# Patient Record
Sex: Male | Born: 1945
Health system: Southern US, Community
[De-identification: ages and names within clinical notes are randomized; demographics above are authoritative.]

## PROBLEM LIST (undated history)

## (undated) DIAGNOSIS — I2510 Atherosclerotic heart disease of native coronary artery without angina pectoris: Secondary | ICD-10-CM

## (undated) DIAGNOSIS — E785 Hyperlipidemia, unspecified: Secondary | ICD-10-CM

## (undated) DIAGNOSIS — K649 Unspecified hemorrhoids: Secondary | ICD-10-CM

## (undated) DIAGNOSIS — K219 Gastro-esophageal reflux disease without esophagitis: Secondary | ICD-10-CM

## (undated) DIAGNOSIS — K635 Polyp of colon: Secondary | ICD-10-CM

## (undated) DIAGNOSIS — I519 Heart disease, unspecified: Secondary | ICD-10-CM

## (undated) DIAGNOSIS — I499 Cardiac arrhythmia, unspecified: Secondary | ICD-10-CM

## (undated) DIAGNOSIS — I219 Acute myocardial infarction, unspecified: Secondary | ICD-10-CM

## (undated) DIAGNOSIS — E119 Type 2 diabetes mellitus without complications: Secondary | ICD-10-CM

## (undated) DIAGNOSIS — R0600 Dyspnea, unspecified: Secondary | ICD-10-CM

## (undated) DIAGNOSIS — I10 Essential (primary) hypertension: Secondary | ICD-10-CM

## (undated) HISTORY — DX: Acute myocardial infarction, unspecified: I21.9

## (undated) HISTORY — PX: COLONOSCOPY: SHX174

## (undated) HISTORY — DX: Type 2 diabetes mellitus without complications: E11.9

## (undated) HISTORY — PX: EYE SURGERY: SHX253

## (undated) HISTORY — DX: Heart disease, unspecified: I51.9

## (undated) HISTORY — DX: Hyperlipidemia, unspecified: E78.5

## (undated) HISTORY — DX: Essential (primary) hypertension: I10

## (undated) HISTORY — PX: HERNIA REPAIR: SHX51

---

## 1997-03-01 DIAGNOSIS — I219 Acute myocardial infarction, unspecified: Secondary | ICD-10-CM

## 1997-03-01 HISTORY — DX: Acute myocardial infarction, unspecified: I21.9

## 1997-03-01 HISTORY — PX: CORONARY ANGIOPLASTY WITH STENT PLACEMENT: SHX49

## 2007-08-07 ENCOUNTER — Ambulatory Visit: Payer: Self-pay | Admitting: General Surgery

## 2008-03-01 HISTORY — PX: CHOLECYSTECTOMY: SHX55

## 2008-12-08 ENCOUNTER — Emergency Department: Payer: Self-pay | Admitting: Emergency Medicine

## 2008-12-10 ENCOUNTER — Inpatient Hospital Stay: Payer: Self-pay | Admitting: General Surgery

## 2009-11-12 ENCOUNTER — Ambulatory Visit: Payer: Self-pay | Admitting: Family Medicine

## 2011-02-19 IMAGING — CT CT ABD-PELV W/ CM
1 of 3 series · 12 of 32 positions shown, 18 images · non-contrast
Comparison: none

REASON FOR EXAM: (1) abdominal pain; (2) same
COMMENTS:

[Series 2: abd/pelvis · axial · 0.75mm/px · z∈[-576,-142]mm · 12 of 173 slices shown, 18 images]
[im 14/173  soft-tissue]
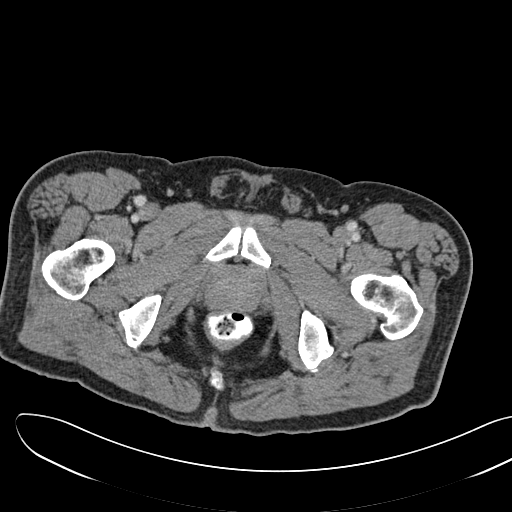
[im 14/173  bone]
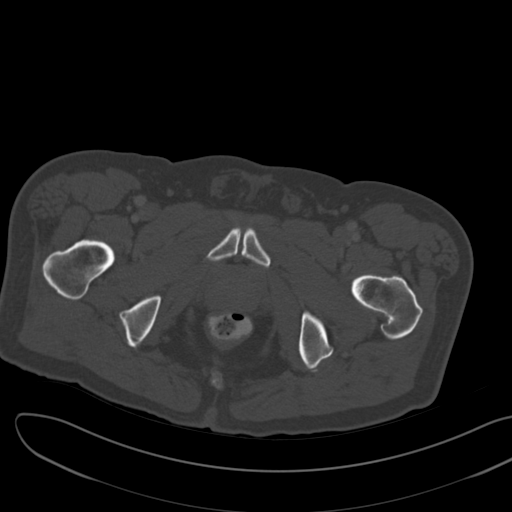
[im 27/173  soft-tissue]
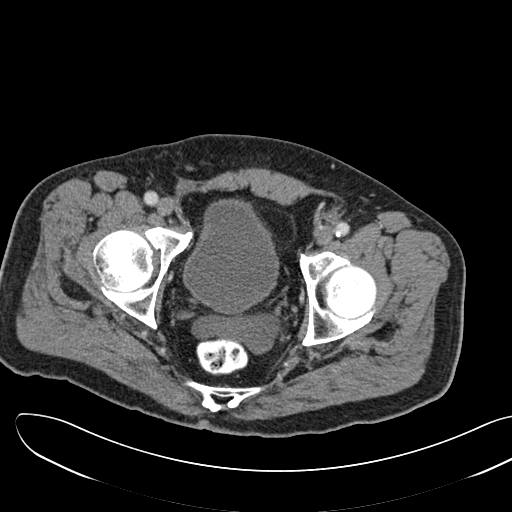
[im 40/173  soft-tissue]
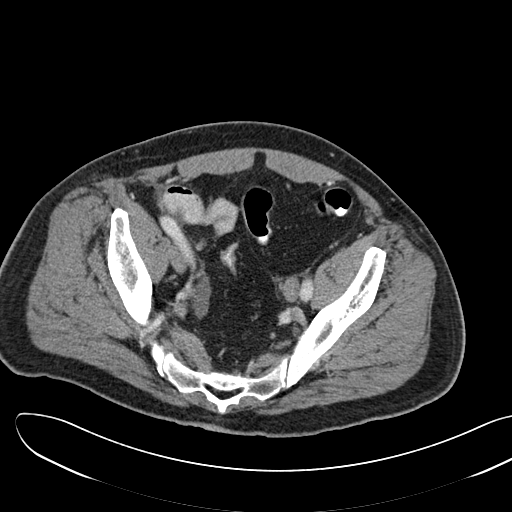
[im 53/173  soft-tissue]
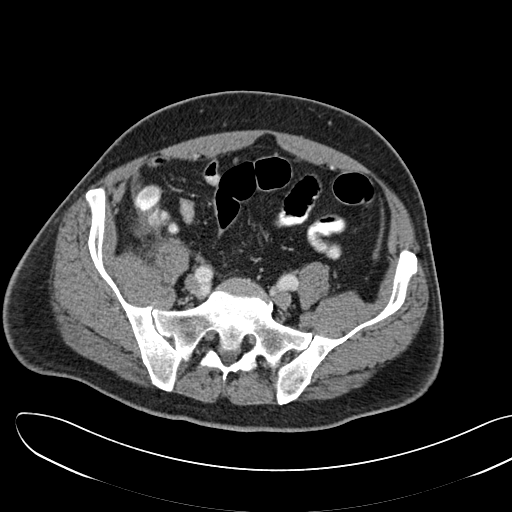
[im 67/173  soft-tissue]
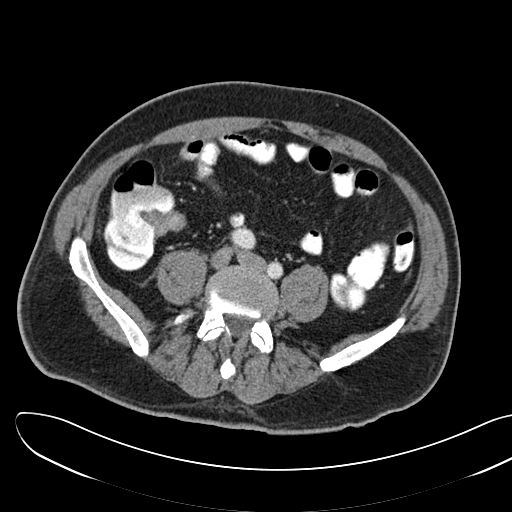
[im 80/173  soft-tissue]
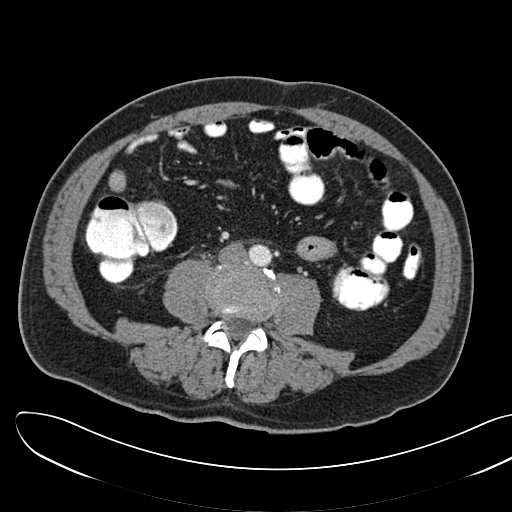
[im 93/173  soft-tissue]
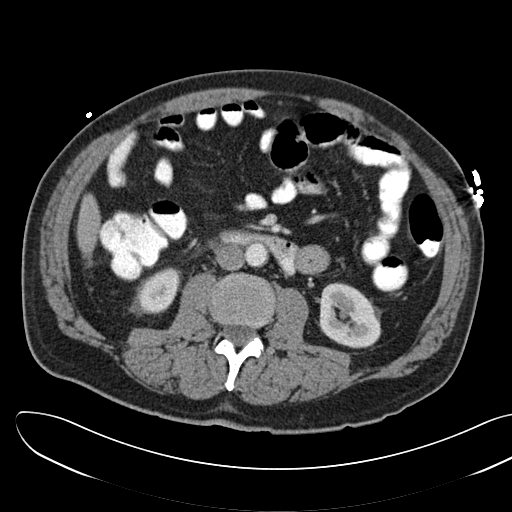
[im 106/173  soft-tissue]
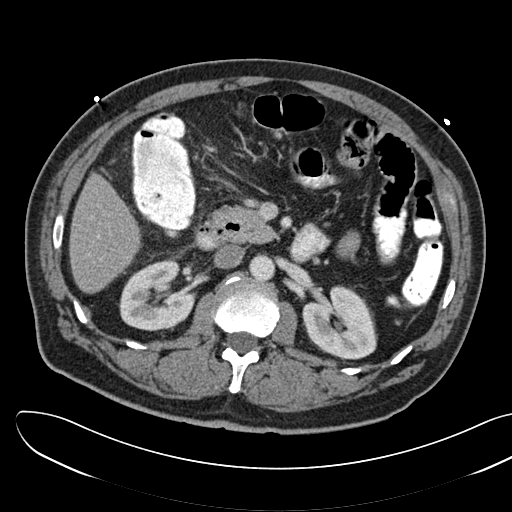
[im 120/173  soft-tissue]
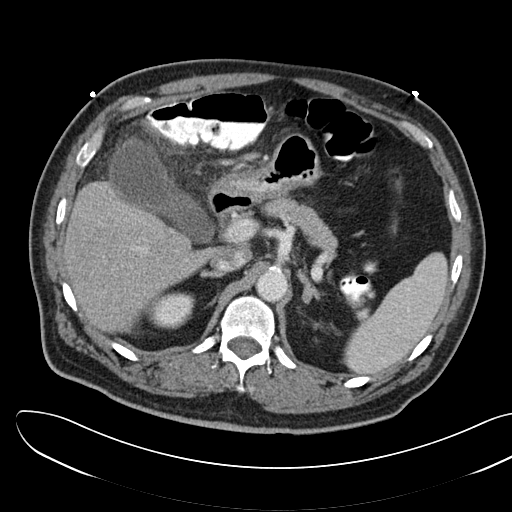
[im 120/173  lung]
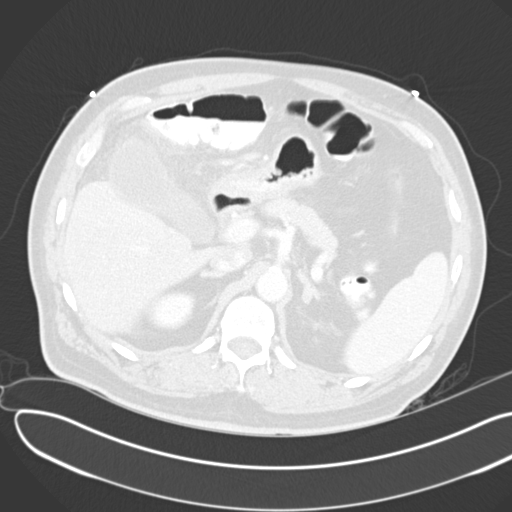
[im 120/173  bone]
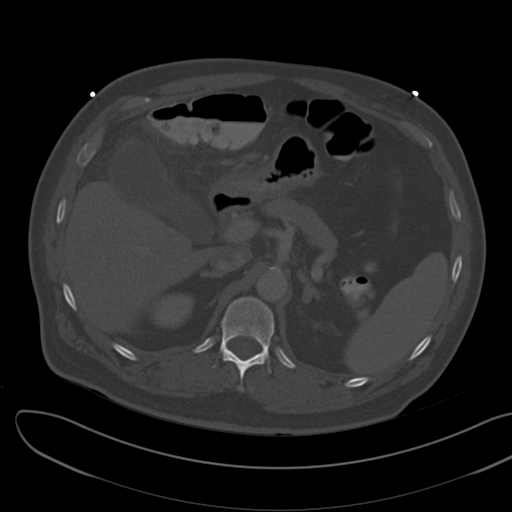
[im 133/173  soft-tissue]
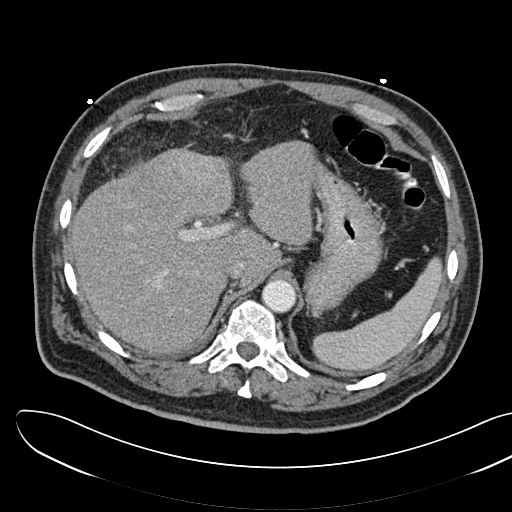
[im 133/173  lung]
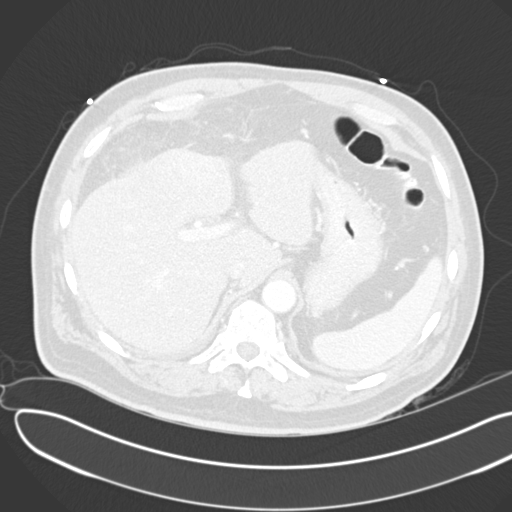
[im 146/173  soft-tissue]
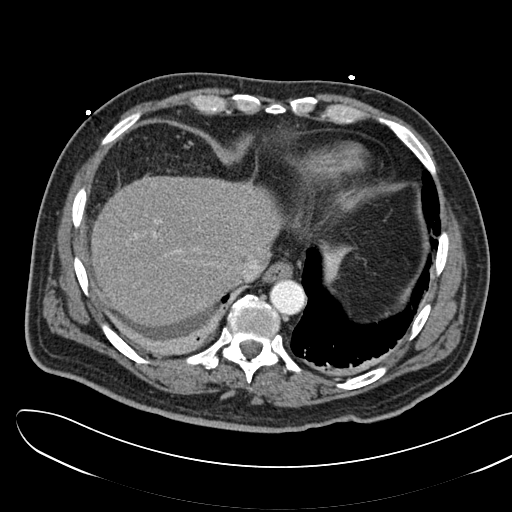
[im 146/173  lung]
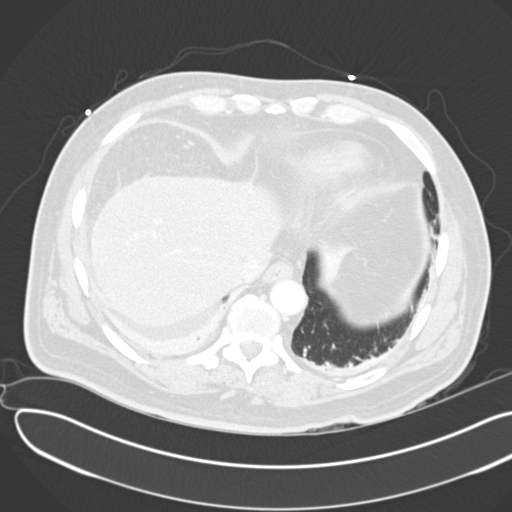
[im 159/173  soft-tissue]
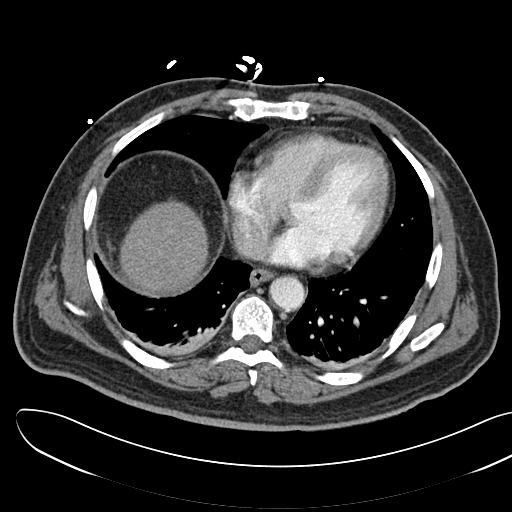
[im 159/173  lung]
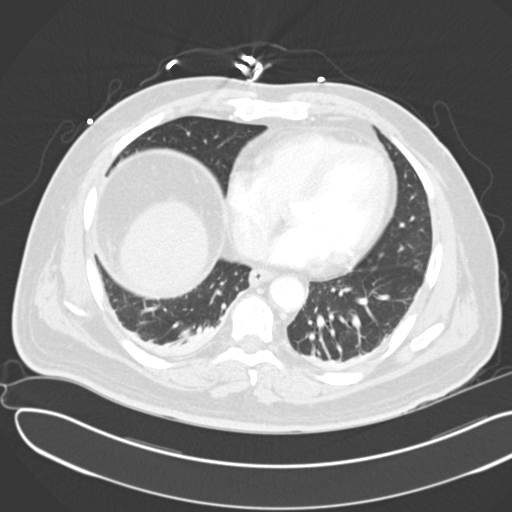

[12 of 32 positions shown; findings below may reference images not displayed]

PROCEDURE:     CT  - CT ABDOMEN / PELVIS  W  - December 11, 2008  [DATE]

RESULT:     CT of the abdomen and pelvis is performed utilizing 100 mL of
Psovue-4MM iodinated intravenous contrast along with oral contrast.
Comparison is made to the previous ultrasound from 12/08/2008 and to a CT
performed without contrast on 12/09/2008.

The patient has developed wall thickening of the gallbladder with
pericholecystic inflammation and some adjacent fluid with a CT appearance
suggestive of acute cholecystitis. No gallstones are evident and number seen
previously on the ultrasound. The common bile duct diameter measures up to
5.8 mm in the pancreatic head. There appears to be fatty infiltration of the
liver. There are bibasalar areas of atelectasis. There is a trace amount of
right pleural effusion which. The spleen is unremarkable. The pancreas
appears normal. The aorta is normal in caliber. There is a nonenhancing
low-attenuation area in the mid to lower pole right kidney laterally
consistent with a simple cyst. No renal stones or solid masses are evident.
There is no abnormal bowel distention. The urinary bladder is unremarkable.
There does appear to be some ascites in the pelvic region.
IMPRESSION: 1. CT findings consistent with acute cholecystitis with a distended
gallbladder with a thickened wall and pericholecystic fluid as well as
pericholecystic inflammatory stranding. There are no gallstones are evident.
2. Trace right pleural effusion with atelectasis at both lung bases.
3. Small amount of ascites in the pelvis.
4. Right renal simple cyst.
5. Fatty infiltration of the liver.

## 2011-03-26 DIAGNOSIS — T148XXA Other injury of unspecified body region, initial encounter: Secondary | ICD-10-CM | POA: Diagnosis not present

## 2011-03-26 DIAGNOSIS — I2510 Atherosclerotic heart disease of native coronary artery without angina pectoris: Secondary | ICD-10-CM | POA: Diagnosis not present

## 2011-03-26 DIAGNOSIS — E7800 Pure hypercholesterolemia, unspecified: Secondary | ICD-10-CM | POA: Diagnosis not present

## 2011-03-26 DIAGNOSIS — I10 Essential (primary) hypertension: Secondary | ICD-10-CM | POA: Diagnosis not present

## 2011-03-30 DIAGNOSIS — R5381 Other malaise: Secondary | ICD-10-CM | POA: Diagnosis not present

## 2011-03-30 DIAGNOSIS — E785 Hyperlipidemia, unspecified: Secondary | ICD-10-CM | POA: Diagnosis not present

## 2011-03-30 DIAGNOSIS — R5383 Other fatigue: Secondary | ICD-10-CM | POA: Diagnosis not present

## 2011-03-30 DIAGNOSIS — I10 Essential (primary) hypertension: Secondary | ICD-10-CM | POA: Diagnosis not present

## 2011-03-30 DIAGNOSIS — E119 Type 2 diabetes mellitus without complications: Secondary | ICD-10-CM | POA: Diagnosis not present

## 2011-04-02 DIAGNOSIS — R0609 Other forms of dyspnea: Secondary | ICD-10-CM | POA: Diagnosis not present

## 2011-04-02 DIAGNOSIS — R0989 Other specified symptoms and signs involving the circulatory and respiratory systems: Secondary | ICD-10-CM | POA: Diagnosis not present

## 2011-04-02 DIAGNOSIS — I2510 Atherosclerotic heart disease of native coronary artery without angina pectoris: Secondary | ICD-10-CM | POA: Diagnosis not present

## 2011-04-02 DIAGNOSIS — R011 Cardiac murmur, unspecified: Secondary | ICD-10-CM | POA: Diagnosis not present

## 2011-04-08 DIAGNOSIS — Z79899 Other long term (current) drug therapy: Secondary | ICD-10-CM | POA: Diagnosis not present

## 2011-04-08 DIAGNOSIS — R5381 Other malaise: Secondary | ICD-10-CM | POA: Diagnosis not present

## 2011-04-08 DIAGNOSIS — E119 Type 2 diabetes mellitus without complications: Secondary | ICD-10-CM | POA: Diagnosis not present

## 2011-04-08 DIAGNOSIS — E7800 Pure hypercholesterolemia, unspecified: Secondary | ICD-10-CM | POA: Diagnosis not present

## 2011-04-08 DIAGNOSIS — Z125 Encounter for screening for malignant neoplasm of prostate: Secondary | ICD-10-CM | POA: Diagnosis not present

## 2011-04-08 DIAGNOSIS — E559 Vitamin D deficiency, unspecified: Secondary | ICD-10-CM | POA: Diagnosis not present

## 2011-05-12 DIAGNOSIS — R5383 Other fatigue: Secondary | ICD-10-CM | POA: Diagnosis not present

## 2011-05-12 DIAGNOSIS — E785 Hyperlipidemia, unspecified: Secondary | ICD-10-CM | POA: Diagnosis not present

## 2011-05-12 DIAGNOSIS — R5381 Other malaise: Secondary | ICD-10-CM | POA: Diagnosis not present

## 2011-05-12 DIAGNOSIS — I10 Essential (primary) hypertension: Secondary | ICD-10-CM | POA: Diagnosis not present

## 2011-05-12 DIAGNOSIS — Z1339 Encounter for screening examination for other mental health and behavioral disorders: Secondary | ICD-10-CM | POA: Diagnosis not present

## 2011-05-12 DIAGNOSIS — Z0000 Encounter for general adult medical examination without abnormal findings: Secondary | ICD-10-CM | POA: Diagnosis not present

## 2011-05-12 DIAGNOSIS — Z1331 Encounter for screening for depression: Secondary | ICD-10-CM | POA: Diagnosis not present

## 2011-06-14 DIAGNOSIS — Z1339 Encounter for screening examination for other mental health and behavioral disorders: Secondary | ICD-10-CM | POA: Diagnosis not present

## 2011-06-14 DIAGNOSIS — I10 Essential (primary) hypertension: Secondary | ICD-10-CM | POA: Diagnosis not present

## 2011-06-14 DIAGNOSIS — L738 Other specified follicular disorders: Secondary | ICD-10-CM | POA: Diagnosis not present

## 2011-06-14 DIAGNOSIS — Z1331 Encounter for screening for depression: Secondary | ICD-10-CM | POA: Diagnosis not present

## 2011-06-14 DIAGNOSIS — H40009 Preglaucoma, unspecified, unspecified eye: Secondary | ICD-10-CM | POA: Diagnosis not present

## 2011-06-16 DIAGNOSIS — K625 Hemorrhage of anus and rectum: Secondary | ICD-10-CM | POA: Diagnosis not present

## 2011-06-16 DIAGNOSIS — K623 Rectal prolapse: Secondary | ICD-10-CM | POA: Diagnosis not present

## 2011-06-16 DIAGNOSIS — K648 Other hemorrhoids: Secondary | ICD-10-CM | POA: Diagnosis not present

## 2011-06-16 DIAGNOSIS — K6289 Other specified diseases of anus and rectum: Secondary | ICD-10-CM | POA: Diagnosis not present

## 2011-06-30 DIAGNOSIS — K648 Other hemorrhoids: Secondary | ICD-10-CM | POA: Diagnosis not present

## 2011-07-14 DIAGNOSIS — E119 Type 2 diabetes mellitus without complications: Secondary | ICD-10-CM | POA: Diagnosis not present

## 2011-07-14 DIAGNOSIS — K648 Other hemorrhoids: Secondary | ICD-10-CM | POA: Diagnosis not present

## 2011-07-14 DIAGNOSIS — E7800 Pure hypercholesterolemia, unspecified: Secondary | ICD-10-CM | POA: Diagnosis not present

## 2011-07-14 DIAGNOSIS — L738 Other specified follicular disorders: Secondary | ICD-10-CM | POA: Diagnosis not present

## 2011-07-14 DIAGNOSIS — I2510 Atherosclerotic heart disease of native coronary artery without angina pectoris: Secondary | ICD-10-CM | POA: Diagnosis not present

## 2011-07-15 DIAGNOSIS — E785 Hyperlipidemia, unspecified: Secondary | ICD-10-CM | POA: Diagnosis not present

## 2011-07-15 DIAGNOSIS — Z79899 Other long term (current) drug therapy: Secondary | ICD-10-CM | POA: Diagnosis not present

## 2011-10-20 DIAGNOSIS — L738 Other specified follicular disorders: Secondary | ICD-10-CM | POA: Diagnosis not present

## 2011-10-20 DIAGNOSIS — E785 Hyperlipidemia, unspecified: Secondary | ICD-10-CM | POA: Diagnosis not present

## 2011-10-20 DIAGNOSIS — I2510 Atherosclerotic heart disease of native coronary artery without angina pectoris: Secondary | ICD-10-CM | POA: Diagnosis not present

## 2011-10-20 DIAGNOSIS — E119 Type 2 diabetes mellitus without complications: Secondary | ICD-10-CM | POA: Diagnosis not present

## 2011-11-04 DIAGNOSIS — E785 Hyperlipidemia, unspecified: Secondary | ICD-10-CM | POA: Diagnosis not present

## 2011-12-09 DIAGNOSIS — Z23 Encounter for immunization: Secondary | ICD-10-CM | POA: Diagnosis not present

## 2011-12-13 DIAGNOSIS — H40009 Preglaucoma, unspecified, unspecified eye: Secondary | ICD-10-CM | POA: Diagnosis not present

## 2011-12-20 DIAGNOSIS — N400 Enlarged prostate without lower urinary tract symptoms: Secondary | ICD-10-CM | POA: Diagnosis not present

## 2011-12-20 DIAGNOSIS — I2510 Atherosclerotic heart disease of native coronary artery without angina pectoris: Secondary | ICD-10-CM | POA: Diagnosis not present

## 2011-12-20 DIAGNOSIS — I10 Essential (primary) hypertension: Secondary | ICD-10-CM | POA: Diagnosis not present

## 2011-12-20 DIAGNOSIS — E119 Type 2 diabetes mellitus without complications: Secondary | ICD-10-CM | POA: Diagnosis not present

## 2012-02-11 DIAGNOSIS — E119 Type 2 diabetes mellitus without complications: Secondary | ICD-10-CM | POA: Diagnosis not present

## 2012-02-11 DIAGNOSIS — R197 Diarrhea, unspecified: Secondary | ICD-10-CM | POA: Diagnosis not present

## 2012-02-11 DIAGNOSIS — N400 Enlarged prostate without lower urinary tract symptoms: Secondary | ICD-10-CM | POA: Diagnosis not present

## 2012-02-11 DIAGNOSIS — I10 Essential (primary) hypertension: Secondary | ICD-10-CM | POA: Diagnosis not present

## 2012-02-15 DIAGNOSIS — R197 Diarrhea, unspecified: Secondary | ICD-10-CM | POA: Diagnosis not present

## 2012-02-15 DIAGNOSIS — N400 Enlarged prostate without lower urinary tract symptoms: Secondary | ICD-10-CM | POA: Diagnosis not present

## 2012-02-15 DIAGNOSIS — Z87891 Personal history of nicotine dependence: Secondary | ICD-10-CM | POA: Diagnosis not present

## 2012-02-15 DIAGNOSIS — R110 Nausea: Secondary | ICD-10-CM | POA: Diagnosis not present

## 2012-02-21 DIAGNOSIS — I10 Essential (primary) hypertension: Secondary | ICD-10-CM | POA: Diagnosis not present

## 2012-02-21 DIAGNOSIS — I2510 Atherosclerotic heart disease of native coronary artery without angina pectoris: Secondary | ICD-10-CM | POA: Diagnosis not present

## 2012-02-21 DIAGNOSIS — E559 Vitamin D deficiency, unspecified: Secondary | ICD-10-CM | POA: Diagnosis not present

## 2012-02-21 DIAGNOSIS — E119 Type 2 diabetes mellitus without complications: Secondary | ICD-10-CM | POA: Diagnosis not present

## 2012-02-21 DIAGNOSIS — R42 Dizziness and giddiness: Secondary | ICD-10-CM | POA: Diagnosis not present

## 2012-03-13 DIAGNOSIS — K649 Unspecified hemorrhoids: Secondary | ICD-10-CM | POA: Diagnosis not present

## 2012-03-13 DIAGNOSIS — I10 Essential (primary) hypertension: Secondary | ICD-10-CM | POA: Diagnosis not present

## 2012-03-13 DIAGNOSIS — E119 Type 2 diabetes mellitus without complications: Secondary | ICD-10-CM | POA: Diagnosis not present

## 2012-03-13 DIAGNOSIS — I2510 Atherosclerotic heart disease of native coronary artery without angina pectoris: Secondary | ICD-10-CM | POA: Diagnosis not present

## 2012-03-27 DIAGNOSIS — I2510 Atherosclerotic heart disease of native coronary artery without angina pectoris: Secondary | ICD-10-CM | POA: Diagnosis not present

## 2012-03-27 DIAGNOSIS — E782 Mixed hyperlipidemia: Secondary | ICD-10-CM | POA: Diagnosis not present

## 2012-04-24 DIAGNOSIS — Z1212 Encounter for screening for malignant neoplasm of rectum: Secondary | ICD-10-CM | POA: Diagnosis not present

## 2012-04-24 DIAGNOSIS — D649 Anemia, unspecified: Secondary | ICD-10-CM | POA: Diagnosis not present

## 2012-04-24 DIAGNOSIS — K648 Other hemorrhoids: Secondary | ICD-10-CM | POA: Diagnosis not present

## 2012-04-24 DIAGNOSIS — K922 Gastrointestinal hemorrhage, unspecified: Secondary | ICD-10-CM | POA: Diagnosis not present

## 2012-04-26 DIAGNOSIS — I2510 Atherosclerotic heart disease of native coronary artery without angina pectoris: Secondary | ICD-10-CM | POA: Diagnosis not present

## 2012-04-26 DIAGNOSIS — K602 Anal fissure, unspecified: Secondary | ICD-10-CM | POA: Diagnosis not present

## 2012-04-26 DIAGNOSIS — D649 Anemia, unspecified: Secondary | ICD-10-CM | POA: Diagnosis not present

## 2012-04-26 DIAGNOSIS — D696 Thrombocytopenia, unspecified: Secondary | ICD-10-CM | POA: Diagnosis not present

## 2012-04-26 DIAGNOSIS — K6289 Other specified diseases of anus and rectum: Secondary | ICD-10-CM | POA: Diagnosis not present

## 2012-04-26 DIAGNOSIS — K922 Gastrointestinal hemorrhage, unspecified: Secondary | ICD-10-CM | POA: Diagnosis not present

## 2012-04-26 DIAGNOSIS — K625 Hemorrhage of anus and rectum: Secondary | ICD-10-CM | POA: Diagnosis not present

## 2012-05-08 DIAGNOSIS — K602 Anal fissure, unspecified: Secondary | ICD-10-CM | POA: Diagnosis not present

## 2012-05-18 DIAGNOSIS — K649 Unspecified hemorrhoids: Secondary | ICD-10-CM | POA: Diagnosis not present

## 2012-05-18 DIAGNOSIS — K625 Hemorrhage of anus and rectum: Secondary | ICD-10-CM | POA: Diagnosis not present

## 2012-05-18 DIAGNOSIS — D126 Benign neoplasm of colon, unspecified: Secondary | ICD-10-CM | POA: Diagnosis not present

## 2012-05-18 DIAGNOSIS — K5730 Diverticulosis of large intestine without perforation or abscess without bleeding: Secondary | ICD-10-CM | POA: Diagnosis not present

## 2012-05-18 LAB — HM COLONOSCOPY

## 2012-06-12 DIAGNOSIS — H40009 Preglaucoma, unspecified, unspecified eye: Secondary | ICD-10-CM | POA: Diagnosis not present

## 2012-06-12 DIAGNOSIS — E785 Hyperlipidemia, unspecified: Secondary | ICD-10-CM | POA: Diagnosis not present

## 2012-06-12 DIAGNOSIS — I2510 Atherosclerotic heart disease of native coronary artery without angina pectoris: Secondary | ICD-10-CM | POA: Diagnosis not present

## 2012-06-12 DIAGNOSIS — I10 Essential (primary) hypertension: Secondary | ICD-10-CM | POA: Diagnosis not present

## 2012-06-12 DIAGNOSIS — J309 Allergic rhinitis, unspecified: Secondary | ICD-10-CM | POA: Diagnosis not present

## 2012-06-12 DIAGNOSIS — E119 Type 2 diabetes mellitus without complications: Secondary | ICD-10-CM | POA: Diagnosis not present

## 2012-07-14 ENCOUNTER — Telehealth: Payer: Self-pay

## 2012-07-14 NOTE — Telephone Encounter (Signed)
When I called the patient to schedule his office visit for his 5 year follow up, he informed me that he had already had a colonoscopy done. He states he was having some bowel problems and was seen by a Dr. Ritta Slot in [place]. The colonoscopy was done on March 20th 2014. Do you need any further follow up for this patient.

## 2012-07-31 DIAGNOSIS — R059 Cough, unspecified: Secondary | ICD-10-CM | POA: Diagnosis not present

## 2012-07-31 DIAGNOSIS — L259 Unspecified contact dermatitis, unspecified cause: Secondary | ICD-10-CM | POA: Diagnosis not present

## 2012-07-31 DIAGNOSIS — J069 Acute upper respiratory infection, unspecified: Secondary | ICD-10-CM | POA: Diagnosis not present

## 2012-07-31 DIAGNOSIS — R05 Cough: Secondary | ICD-10-CM | POA: Diagnosis not present

## 2012-07-31 DIAGNOSIS — I2510 Atherosclerotic heart disease of native coronary artery without angina pectoris: Secondary | ICD-10-CM | POA: Diagnosis not present

## 2012-08-03 DIAGNOSIS — Z0189 Encounter for other specified special examinations: Secondary | ICD-10-CM | POA: Diagnosis not present

## 2012-08-03 DIAGNOSIS — L255 Unspecified contact dermatitis due to plants, except food: Secondary | ICD-10-CM | POA: Diagnosis not present

## 2012-08-03 DIAGNOSIS — D239 Other benign neoplasm of skin, unspecified: Secondary | ICD-10-CM | POA: Diagnosis not present

## 2012-08-09 ENCOUNTER — Encounter: Payer: Self-pay | Admitting: General Surgery

## 2012-08-18 DIAGNOSIS — R059 Cough, unspecified: Secondary | ICD-10-CM | POA: Diagnosis not present

## 2012-08-18 DIAGNOSIS — L259 Unspecified contact dermatitis, unspecified cause: Secondary | ICD-10-CM | POA: Diagnosis not present

## 2012-08-18 DIAGNOSIS — I2510 Atherosclerotic heart disease of native coronary artery without angina pectoris: Secondary | ICD-10-CM | POA: Diagnosis not present

## 2012-08-18 DIAGNOSIS — R05 Cough: Secondary | ICD-10-CM | POA: Diagnosis not present

## 2012-08-18 DIAGNOSIS — B029 Zoster without complications: Secondary | ICD-10-CM | POA: Diagnosis not present

## 2012-09-07 DIAGNOSIS — D485 Neoplasm of uncertain behavior of skin: Secondary | ICD-10-CM | POA: Diagnosis not present

## 2012-09-07 DIAGNOSIS — L738 Other specified follicular disorders: Secondary | ICD-10-CM | POA: Diagnosis not present

## 2012-09-07 DIAGNOSIS — R229 Localized swelling, mass and lump, unspecified: Secondary | ICD-10-CM | POA: Diagnosis not present

## 2012-10-31 DIAGNOSIS — Z125 Encounter for screening for malignant neoplasm of prostate: Secondary | ICD-10-CM | POA: Diagnosis not present

## 2012-10-31 DIAGNOSIS — Z1331 Encounter for screening for depression: Secondary | ICD-10-CM | POA: Diagnosis not present

## 2012-10-31 DIAGNOSIS — Z0000 Encounter for general adult medical examination without abnormal findings: Secondary | ICD-10-CM | POA: Diagnosis not present

## 2012-10-31 DIAGNOSIS — R05 Cough: Secondary | ICD-10-CM | POA: Diagnosis not present

## 2012-10-31 DIAGNOSIS — L259 Unspecified contact dermatitis, unspecified cause: Secondary | ICD-10-CM | POA: Diagnosis not present

## 2012-10-31 DIAGNOSIS — I2510 Atherosclerotic heart disease of native coronary artery without angina pectoris: Secondary | ICD-10-CM | POA: Diagnosis not present

## 2012-10-31 DIAGNOSIS — Z1212 Encounter for screening for malignant neoplasm of rectum: Secondary | ICD-10-CM | POA: Diagnosis not present

## 2012-10-31 DIAGNOSIS — R059 Cough, unspecified: Secondary | ICD-10-CM | POA: Diagnosis not present

## 2012-10-31 DIAGNOSIS — Z1339 Encounter for screening examination for other mental health and behavioral disorders: Secondary | ICD-10-CM | POA: Diagnosis not present

## 2012-12-07 DIAGNOSIS — Z23 Encounter for immunization: Secondary | ICD-10-CM | POA: Diagnosis not present

## 2012-12-11 DIAGNOSIS — H40009 Preglaucoma, unspecified, unspecified eye: Secondary | ICD-10-CM | POA: Diagnosis not present

## 2013-01-30 DIAGNOSIS — J309 Allergic rhinitis, unspecified: Secondary | ICD-10-CM | POA: Diagnosis not present

## 2013-01-30 DIAGNOSIS — D649 Anemia, unspecified: Secondary | ICD-10-CM | POA: Diagnosis not present

## 2013-01-30 DIAGNOSIS — E119 Type 2 diabetes mellitus without complications: Secondary | ICD-10-CM | POA: Diagnosis not present

## 2013-01-30 DIAGNOSIS — E7800 Pure hypercholesterolemia, unspecified: Secondary | ICD-10-CM | POA: Diagnosis not present

## 2013-01-30 DIAGNOSIS — I2510 Atherosclerotic heart disease of native coronary artery without angina pectoris: Secondary | ICD-10-CM | POA: Diagnosis not present

## 2013-01-30 DIAGNOSIS — I10 Essential (primary) hypertension: Secondary | ICD-10-CM | POA: Diagnosis not present

## 2013-01-30 DIAGNOSIS — E785 Hyperlipidemia, unspecified: Secondary | ICD-10-CM | POA: Diagnosis not present

## 2013-01-30 DIAGNOSIS — E559 Vitamin D deficiency, unspecified: Secondary | ICD-10-CM | POA: Diagnosis not present

## 2013-03-20 DIAGNOSIS — R197 Diarrhea, unspecified: Secondary | ICD-10-CM | POA: Diagnosis not present

## 2013-03-27 DIAGNOSIS — I209 Angina pectoris, unspecified: Secondary | ICD-10-CM | POA: Diagnosis not present

## 2013-03-27 DIAGNOSIS — IMO0001 Reserved for inherently not codable concepts without codable children: Secondary | ICD-10-CM | POA: Diagnosis not present

## 2013-03-27 DIAGNOSIS — I2510 Atherosclerotic heart disease of native coronary artery without angina pectoris: Secondary | ICD-10-CM | POA: Diagnosis not present

## 2013-03-27 DIAGNOSIS — I10 Essential (primary) hypertension: Secondary | ICD-10-CM | POA: Diagnosis not present

## 2013-04-19 DIAGNOSIS — R197 Diarrhea, unspecified: Secondary | ICD-10-CM | POA: Diagnosis not present

## 2013-06-11 DIAGNOSIS — H40009 Preglaucoma, unspecified, unspecified eye: Secondary | ICD-10-CM | POA: Diagnosis not present

## 2013-06-25 DIAGNOSIS — I10 Essential (primary) hypertension: Secondary | ICD-10-CM | POA: Diagnosis not present

## 2013-06-25 DIAGNOSIS — J309 Allergic rhinitis, unspecified: Secondary | ICD-10-CM | POA: Diagnosis not present

## 2013-06-25 DIAGNOSIS — I2510 Atherosclerotic heart disease of native coronary artery without angina pectoris: Secondary | ICD-10-CM | POA: Diagnosis not present

## 2013-06-25 DIAGNOSIS — E1129 Type 2 diabetes mellitus with other diabetic kidney complication: Secondary | ICD-10-CM | POA: Diagnosis not present

## 2013-07-12 DIAGNOSIS — H40009 Preglaucoma, unspecified, unspecified eye: Secondary | ICD-10-CM | POA: Diagnosis not present

## 2013-10-01 DIAGNOSIS — K648 Other hemorrhoids: Secondary | ICD-10-CM | POA: Diagnosis not present

## 2013-10-01 DIAGNOSIS — K625 Hemorrhage of anus and rectum: Secondary | ICD-10-CM | POA: Diagnosis not present

## 2013-10-17 DIAGNOSIS — Z2089 Contact with and (suspected) exposure to other communicable diseases: Secondary | ICD-10-CM | POA: Diagnosis not present

## 2013-10-17 DIAGNOSIS — K648 Other hemorrhoids: Secondary | ICD-10-CM | POA: Diagnosis not present

## 2013-10-22 DIAGNOSIS — K922 Gastrointestinal hemorrhage, unspecified: Secondary | ICD-10-CM | POA: Diagnosis not present

## 2013-10-22 DIAGNOSIS — K648 Other hemorrhoids: Secondary | ICD-10-CM | POA: Diagnosis not present

## 2013-10-22 DIAGNOSIS — I10 Essential (primary) hypertension: Secondary | ICD-10-CM | POA: Diagnosis not present

## 2013-10-22 DIAGNOSIS — K649 Unspecified hemorrhoids: Secondary | ICD-10-CM | POA: Diagnosis not present

## 2013-10-22 DIAGNOSIS — I2510 Atherosclerotic heart disease of native coronary artery without angina pectoris: Secondary | ICD-10-CM | POA: Diagnosis not present

## 2013-10-22 DIAGNOSIS — E119 Type 2 diabetes mellitus without complications: Secondary | ICD-10-CM | POA: Diagnosis not present

## 2013-11-06 DIAGNOSIS — I2510 Atherosclerotic heart disease of native coronary artery without angina pectoris: Secondary | ICD-10-CM | POA: Diagnosis not present

## 2013-11-06 DIAGNOSIS — K922 Gastrointestinal hemorrhage, unspecified: Secondary | ICD-10-CM | POA: Diagnosis not present

## 2013-11-06 DIAGNOSIS — E119 Type 2 diabetes mellitus without complications: Secondary | ICD-10-CM | POA: Diagnosis not present

## 2013-11-06 DIAGNOSIS — K648 Other hemorrhoids: Secondary | ICD-10-CM | POA: Diagnosis not present

## 2013-11-06 DIAGNOSIS — I10 Essential (primary) hypertension: Secondary | ICD-10-CM | POA: Diagnosis not present

## 2013-12-03 DIAGNOSIS — Z23 Encounter for immunization: Secondary | ICD-10-CM | POA: Diagnosis not present

## 2013-12-10 DIAGNOSIS — H4011X0 Primary open-angle glaucoma, stage unspecified: Secondary | ICD-10-CM | POA: Diagnosis not present

## 2013-12-31 DIAGNOSIS — M7731 Calcaneal spur, right foot: Secondary | ICD-10-CM | POA: Diagnosis not present

## 2013-12-31 DIAGNOSIS — M79671 Pain in right foot: Secondary | ICD-10-CM | POA: Diagnosis not present

## 2013-12-31 DIAGNOSIS — M722 Plantar fascial fibromatosis: Secondary | ICD-10-CM | POA: Diagnosis not present

## 2014-03-14 DIAGNOSIS — E780 Pure hypercholesterolemia: Secondary | ICD-10-CM | POA: Diagnosis not present

## 2014-03-14 DIAGNOSIS — Z23 Encounter for immunization: Secondary | ICD-10-CM | POA: Diagnosis not present

## 2014-03-14 DIAGNOSIS — E119 Type 2 diabetes mellitus without complications: Secondary | ICD-10-CM | POA: Diagnosis not present

## 2014-03-14 DIAGNOSIS — I2510 Atherosclerotic heart disease of native coronary artery without angina pectoris: Secondary | ICD-10-CM | POA: Diagnosis not present

## 2014-03-14 DIAGNOSIS — Z0000 Encounter for general adult medical examination without abnormal findings: Secondary | ICD-10-CM | POA: Diagnosis not present

## 2014-03-14 DIAGNOSIS — K649 Unspecified hemorrhoids: Secondary | ICD-10-CM | POA: Diagnosis not present

## 2014-03-14 DIAGNOSIS — D696 Thrombocytopenia, unspecified: Secondary | ICD-10-CM | POA: Diagnosis not present

## 2014-03-14 DIAGNOSIS — E785 Hyperlipidemia, unspecified: Secondary | ICD-10-CM | POA: Diagnosis not present

## 2014-03-14 DIAGNOSIS — Z125 Encounter for screening for malignant neoplasm of prostate: Secondary | ICD-10-CM | POA: Diagnosis not present

## 2014-03-26 DIAGNOSIS — I2510 Atherosclerotic heart disease of native coronary artery without angina pectoris: Secondary | ICD-10-CM | POA: Diagnosis not present

## 2014-03-26 DIAGNOSIS — R079 Chest pain, unspecified: Secondary | ICD-10-CM | POA: Diagnosis not present

## 2014-03-26 DIAGNOSIS — R011 Cardiac murmur, unspecified: Secondary | ICD-10-CM | POA: Diagnosis not present

## 2014-03-26 DIAGNOSIS — R0602 Shortness of breath: Secondary | ICD-10-CM | POA: Diagnosis not present

## 2014-04-03 DIAGNOSIS — R079 Chest pain, unspecified: Secondary | ICD-10-CM | POA: Diagnosis not present

## 2014-04-03 DIAGNOSIS — R011 Cardiac murmur, unspecified: Secondary | ICD-10-CM | POA: Diagnosis not present

## 2014-04-03 DIAGNOSIS — I2510 Atherosclerotic heart disease of native coronary artery without angina pectoris: Secondary | ICD-10-CM | POA: Diagnosis not present

## 2014-06-10 DIAGNOSIS — H4011X0 Primary open-angle glaucoma, stage unspecified: Secondary | ICD-10-CM | POA: Diagnosis not present

## 2014-06-30 DEATH — deceased

## 2014-07-15 DIAGNOSIS — I2510 Atherosclerotic heart disease of native coronary artery without angina pectoris: Secondary | ICD-10-CM | POA: Diagnosis not present

## 2014-07-15 DIAGNOSIS — E780 Pure hypercholesterolemia: Secondary | ICD-10-CM | POA: Diagnosis not present

## 2014-07-15 DIAGNOSIS — Z23 Encounter for immunization: Secondary | ICD-10-CM | POA: Diagnosis not present

## 2014-07-15 DIAGNOSIS — Z0000 Encounter for general adult medical examination without abnormal findings: Secondary | ICD-10-CM | POA: Diagnosis not present

## 2014-07-15 DIAGNOSIS — E119 Type 2 diabetes mellitus without complications: Secondary | ICD-10-CM | POA: Diagnosis not present

## 2014-07-15 DIAGNOSIS — N400 Enlarged prostate without lower urinary tract symptoms: Secondary | ICD-10-CM | POA: Diagnosis not present

## 2014-07-15 DIAGNOSIS — I10 Essential (primary) hypertension: Secondary | ICD-10-CM | POA: Diagnosis not present

## 2014-09-09 ENCOUNTER — Other Ambulatory Visit: Payer: Self-pay | Admitting: Family Medicine

## 2014-11-14 ENCOUNTER — Encounter: Payer: Self-pay | Admitting: Family Medicine

## 2014-11-14 ENCOUNTER — Ambulatory Visit (INDEPENDENT_AMBULATORY_CARE_PROVIDER_SITE_OTHER): Payer: Medicare Other | Admitting: Family Medicine

## 2014-11-14 VITALS — BP 110/60 | HR 60 | Temp 97.6°F | Resp 16 | Ht 68.0 in | Wt 182.0 lb

## 2014-11-14 DIAGNOSIS — IMO0002 Reserved for concepts with insufficient information to code with codable children: Secondary | ICD-10-CM | POA: Insufficient documentation

## 2014-11-14 DIAGNOSIS — Z87891 Personal history of nicotine dependence: Secondary | ICD-10-CM | POA: Insufficient documentation

## 2014-11-14 DIAGNOSIS — K648 Other hemorrhoids: Secondary | ICD-10-CM | POA: Insufficient documentation

## 2014-11-14 DIAGNOSIS — I10 Essential (primary) hypertension: Secondary | ICD-10-CM | POA: Diagnosis not present

## 2014-11-14 DIAGNOSIS — E785 Hyperlipidemia, unspecified: Secondary | ICD-10-CM | POA: Insufficient documentation

## 2014-11-14 DIAGNOSIS — E119 Type 2 diabetes mellitus without complications: Secondary | ICD-10-CM | POA: Insufficient documentation

## 2014-11-14 DIAGNOSIS — K219 Gastro-esophageal reflux disease without esophagitis: Secondary | ICD-10-CM | POA: Insufficient documentation

## 2014-11-14 DIAGNOSIS — K922 Gastrointestinal hemorrhage, unspecified: Secondary | ICD-10-CM | POA: Insufficient documentation

## 2014-11-14 DIAGNOSIS — E559 Vitamin D deficiency, unspecified: Secondary | ICD-10-CM | POA: Insufficient documentation

## 2014-11-14 DIAGNOSIS — E1169 Type 2 diabetes mellitus with other specified complication: Secondary | ICD-10-CM | POA: Insufficient documentation

## 2014-11-14 DIAGNOSIS — I2510 Atherosclerotic heart disease of native coronary artery without angina pectoris: Secondary | ICD-10-CM | POA: Insufficient documentation

## 2014-11-14 DIAGNOSIS — N400 Enlarged prostate without lower urinary tract symptoms: Secondary | ICD-10-CM | POA: Insufficient documentation

## 2014-11-14 DIAGNOSIS — T7840XA Allergy, unspecified, initial encounter: Secondary | ICD-10-CM | POA: Insufficient documentation

## 2014-11-14 DIAGNOSIS — J302 Other seasonal allergic rhinitis: Secondary | ICD-10-CM | POA: Insufficient documentation

## 2014-11-14 DIAGNOSIS — M19049 Primary osteoarthritis, unspecified hand: Secondary | ICD-10-CM | POA: Insufficient documentation

## 2014-11-14 DIAGNOSIS — R419 Unspecified symptoms and signs involving cognitive functions and awareness: Secondary | ICD-10-CM | POA: Insufficient documentation

## 2014-11-14 DIAGNOSIS — L570 Actinic keratosis: Secondary | ICD-10-CM | POA: Insufficient documentation

## 2014-11-14 DIAGNOSIS — E7800 Pure hypercholesterolemia, unspecified: Secondary | ICD-10-CM | POA: Insufficient documentation

## 2014-11-14 LAB — POCT GLYCOSYLATED HEMOGLOBIN (HGB A1C): Hemoglobin A1C: 7.6

## 2014-11-14 MED ORDER — GLIPIZIDE 10 MG PO TABS: 10.0000 mg | ORAL_TABLET | Freq: Every day | ORAL | Status: DC

## 2014-11-14 NOTE — Progress Notes (Signed)
Patient ID: Carl Dominguez, male   DOB: 1945-07-30, 69 y.o.   MRN: [id number]    Subjective:  HPI  Diabetes Mellitus Type II, Follow-up:   No results found for: HGBA1C  Last seen for diabetes 4 months ago.  Management since then includes none. He reports good compliance with treatment. He is not having side effects.  Current symptoms include none. Home blood sugar records: fasting has not been below 200.  Episodes of hypoglycemia? no   Current Insulin Regimen: n/a Most Recent Eye Exam: about 6 months ago Current exercise: Golf and walks occasionally  Pertinent Labs: No results found for: CHOL, TRIG, CHOLHDL, CREATININE  Wt Readings from Last 3 Encounters:  11/14/14 182 lb (82.555 kg)    ------------------------------------------------------------------------  Hypertension, follow-up:  BP Readings from Last 3 Encounters:  11/14/14 110/60    He was last seen for hypertension 4 months ago.  BP at that visit was 122/62. Management since that visit includes none. He reports good compliance with treatment. He is not having side effects.  He is exercising.   Outside blood pressures are not being checked. He is experiencing none.  Cardiovascular risk factors include diabetes mellitus, dyslipidemia and hypertension.   Wt Readings from Last 3 Encounters:  11/14/14 182 lb (82.555 kg)    ------------------------------------------------------------------------    Last labs 03/14/14    Prior to Admission medications   Medication Sig Start Date End Date Taking? Authorizing Provider  aspirin 81 MG tablet Take by mouth. 04/06/10  Yes Historical Provider, MD  CHOLESTYRAMINE PO Take by mouth. 07/06/12  Yes Historical Provider, MD  fluticasone (FLONASE) 50 MCG/ACT nasal spray Place into the nose. 07/31/12  Yes Historical Provider, MD  glipiZIDE (GLUCOTROL) 5 MG tablet Take by mouth. 02/12/14  Yes Historical Provider, MD  losartan (COZAAR) 50 MG tablet Take by mouth. 06/30/14  Yes  Historical Provider, MD  metoprolol succinate (TOPROL-XL) 25 MG 24 hr tablet Take by mouth. 07/26/14  Yes Historical Provider, MD  montelukast (SINGULAIR) 10 MG tablet Take by mouth. 06/25/13  Yes Historical Provider, MD  pioglitazone (ACTOS) 45 MG tablet Take by mouth. 04/25/14  Yes Historical Provider, MD  psyllium (METAMUCIL SMOOTH TEXTURE) 28 % packet Take by mouth. 11/06/13  Yes Historical Provider, MD  atorvastatin (LIPITOR) 40 MG tablet TAKE 1 TABLET AT BEDTIME 09/09/14   Richard Maceo Pro., MD  cholecalciferol (VITAMIN D) 1000 UNITS tablet Take by mouth.    Historical Provider, MD  famotidine (PEPCID) 20 MG tablet Take by mouth.    Historical Provider, MD  ibuprofen (ADVIL,MOTRIN) 200 MG tablet Take by mouth.    Historical Provider, MD  ketotifen (ALAWAY) 0.025 % ophthalmic solution Apply to eye.    Historical Provider, MD  latanoprost (XALATAN) 0.005 % ophthalmic solution Apply to eye.    Historical Provider, MD    Patient Active Problem List   Diagnosis Date Noted  . Actinic keratosis 11/14/2014  . CAD in native artery 11/14/2014  . Cognitive complaints 11/14/2014  . Diabetes 11/14/2014  . Essential (primary) hypertension 11/14/2014  . Gastro-esophageal reflux disease without esophagitis 11/14/2014  . Hemorrhage of gastrointestinal tract 11/14/2014  . Internal hemorrhoids 11/14/2014  . Personal history of tobacco use, presenting hazards to health 11/14/2014  . HLD (hyperlipidemia) 11/14/2014  . Allergic state 11/14/2014  . Arthritis of hand, degenerative 11/14/2014  . Allergic rhinitis, seasonal 11/14/2014  . Prostatism 11/14/2014  . Hypercholesterolemia without hypertriglyceridemia 11/14/2014  . Change in blood platelet count 11/14/2014  . Diabetes mellitus,  type 2 11/14/2014  . Avitaminosis D 11/14/2014    Past Medical History  Diagnosis Date  . Hypertension   . Diabetes mellitus without complication   . Myocardial infarction 1999  . Heart disease     Social  History   Social History  . Marital Status: Married    Spouse Name: N/A  . Number of Children: N/A  . Years of Education: N/A   Occupational History  . Not on file.   Social History Main Topics  . Smoking status: Never Smoker   . Smokeless tobacco: Never Used  . Alcohol Use: Yes  . Drug Use: No  . Sexual Activity: Not on file   Other Topics Concern  . Not on file   Social History Narrative  . No narrative on file    Not on File  Review of Systems  Constitutional: Negative.   HENT: Negative.   Eyes: Negative.   Respiratory: Negative.   Cardiovascular: Negative.   Gastrointestinal: Negative.   Genitourinary: Negative.   Musculoskeletal: Negative.   Skin: Negative.   Neurological: Negative.   Endo/Heme/Allergies: Negative.   Psychiatric/Behavioral: Negative.      There is no immunization history on file for this patient. Objective:  There were no vitals taken for this visit.  Physical Exam  Constitutional: He is oriented to person, place, and time and well-developed, well-nourished, and in no distress.  HENT:  Head: Normocephalic and atraumatic.  Right Ear: External ear normal.  Left Ear: External ear normal.  Nose: Nose normal.  Eyes: Conjunctivae are normal.  Neck: Neck supple.  Cardiovascular: Normal rate, regular rhythm and normal heart sounds.   Pulmonary/Chest: Effort normal and breath sounds normal.  Abdominal: Soft.  Neurological: He is alert and oriented to person, place, and time.  Skin: Skin is warm and dry.  Psychiatric: Mood, memory, affect and judgment normal.    No results found for: WBC, HGB, HCT, PLT, GLUCOSE, CHOL, TRIG, HDL, LDLDIRECT, LDLCALC, TSH, PSA, INR, GLUF, HGBA1C, MICROALBUR  CMP  No results found for: NA, K, CL, CO2, GLUCOSE, BUN, CREATININE, CALCIUM, PROT, ALBUMIN, AST, ALT, ALKPHOS, BILITOT, GFRNONAA, GFRAA  Assessment and Plan :  1. Essential (primary) hypertension   2. Type 2 diabetes mellitus without  complication Increase Glipizide to 10mg  daily. - POCT HgB A1C--7.6 today    - glipiZIDE (GLUCOTROL) 10 MG tablet; Take 1 tablet (10 mg total) by mouth daily.  Dispense: 90 tablet; Refill: 3 3.CAD All risk factors treated.  Miguel Aschoff MD [facility] 11/14/2014 8:08 AM

## 2014-11-30 DIAGNOSIS — Z23 Encounter for immunization: Secondary | ICD-10-CM | POA: Diagnosis not present

## 2014-12-09 DIAGNOSIS — H401130 Primary open-angle glaucoma, bilateral, stage unspecified: Secondary | ICD-10-CM | POA: Diagnosis not present

## 2014-12-16 DIAGNOSIS — H401131 Primary open-angle glaucoma, bilateral, mild stage: Secondary | ICD-10-CM | POA: Diagnosis not present

## 2014-12-27 ENCOUNTER — Ambulatory Visit (INDEPENDENT_AMBULATORY_CARE_PROVIDER_SITE_OTHER): Payer: Medicare Other | Admitting: Family Medicine

## 2014-12-27 ENCOUNTER — Encounter: Payer: Self-pay | Admitting: Family Medicine

## 2014-12-27 VITALS — BP 110/56 | HR 68 | Temp 98.1°F | Resp 16 | Wt 181.0 lb

## 2014-12-27 DIAGNOSIS — S60351A Superficial foreign body of right thumb, initial encounter: Secondary | ICD-10-CM | POA: Diagnosis not present

## 2014-12-27 NOTE — Progress Notes (Signed)
Patient ID: SAED HUDLOW, male   DOB: 1945/11/29, 69 y.o.   MRN: [id number] Name: Carl Dominguez   MRN: [id number]    DOB: May 08, 1945   Date:12/27/2014       Progress Note  Subjective  Chief Complaint  Chief Complaint  Patient presents with  . Finger Injury    right thumb X 3 weeks    HPI  Thumb Injury:  Patient reports 3 weeks ago he was playing golf. Patient states he picked up the golf cub and a piece of the shaft punctured his right thumb.  Feels like a splinter is still in the thumb.  Past Surgical History  Procedure Laterality Date  . Cholecystectomy  2010  . Coronary angioplasty with stent placement  1999  . Hernia repair     Past Medical History  Diagnosis Date  . Hypertension   . Diabetes mellitus without complication ([place])   . Myocardial infarction ([place]) 1999  . Heart disease    Social History  Substance Use Topics  . Smoking status: Former Research scientist (life sciences)  . Smokeless tobacco: Never Used     Comment: has been quit for 30 years  . Alcohol Use: Yes     Comment: occasionally    Current outpatient prescriptions:  .  aspirin 81 MG tablet, Take by mouth., Disp: , Rfl:  .  atorvastatin (LIPITOR) 40 MG tablet, TAKE 1 TABLET AT BEDTIME, Disp: 90 tablet, Rfl: 3 .  cholecalciferol (VITAMIN D) 1000 UNITS tablet, Take by mouth., Disp: , Rfl:  .  CHOLESTYRAMINE PO, Take by mouth., Disp: , Rfl:  .  famotidine (PEPCID) 20 MG tablet, Take by mouth., Disp: , Rfl:  .  fluticasone (FLONASE) 50 MCG/ACT nasal spray, Place into the nose., Disp: , Rfl:  .  glipiZIDE (GLUCOTROL) 10 MG tablet, Take 1 tablet (10 mg total) by mouth daily., Disp: 90 tablet, Rfl: 3 .  ibuprofen (ADVIL,MOTRIN) 200 MG tablet, Take by mouth., Disp: , Rfl:  .  latanoprost (XALATAN) 0.005 % ophthalmic solution, Apply to eye., Disp: , Rfl:  .  losartan (COZAAR) 50 MG tablet, Take by mouth., Disp: , Rfl:  .  metoprolol succinate (TOPROL-XL) 25 MG 24 hr tablet, Take by mouth., Disp: , Rfl:  .  pioglitazone (ACTOS)  45 MG tablet, Take by mouth., Disp: , Rfl:  .  psyllium (METAMUCIL SMOOTH TEXTURE) 28 % packet, Take by mouth., Disp: , Rfl:   No Known Allergies  Review of Systems  Constitutional: Negative.   HENT: Negative.   Eyes: Negative.   Respiratory: Negative.   Cardiovascular: Negative.   Gastrointestinal: Negative.   Genitourinary: Negative.   Musculoskeletal: Positive for joint pain.  Skin: Negative.   Neurological: Negative.   Endo/Heme/Allergies: Negative.   Psychiatric/Behavioral: Negative.    Objective  Filed Vitals:   12/27/14 0942  BP: 110/56  Pulse: 68  Temp: 98.1 F (36.7 C)  TempSrc: Oral  Resp: 16  Weight: 181 lb (82.101 kg)   Physical Exam  Constitutional: He is well-developed, well-nourished, and in no distress.  HENT:  Head: Normocephalic.  Neck: Neck supple.  Musculoskeletal: Normal range of motion.  Tender pad of the right thumb with a 2 mm callus just distal to the IP joint.    Recent Results (from the past 2160 hour(s))  POCT HgB A1C     Status: None   Collection Time: 11/14/14  9:13 AM  Result Value Ref Range   Hemoglobin A1C 7.6    Assessment & Plan  1.  Foreign body of right thumb Onset 3 weeks ago a sliver of a graphite golf club shaft was stuck in the right thumb. Was not able to remove any foreign body initially and still feel pain with certain pressure over the area. Unable to remove splinter. Area has callus formation. Last Tdap was on 12-06-2007. Used Cryo-Pen to freeze the callus to blister up and peel away in the next 7-10 days. Recheck if no better in 10-14 days.

## 2015-01-01 ENCOUNTER — Encounter: Payer: Self-pay | Admitting: Family Medicine

## 2015-03-17 ENCOUNTER — Ambulatory Visit: Payer: Medicare Other | Admitting: Family Medicine

## 2015-03-24 ENCOUNTER — Encounter: Payer: Self-pay | Admitting: Family Medicine

## 2015-03-24 ENCOUNTER — Ambulatory Visit (INDEPENDENT_AMBULATORY_CARE_PROVIDER_SITE_OTHER): Payer: Medicare Other | Admitting: Family Medicine

## 2015-03-24 VITALS — BP 122/62 | HR 66 | Temp 98.1°F | Resp 16 | Wt 184.0 lb

## 2015-03-24 DIAGNOSIS — I10 Essential (primary) hypertension: Secondary | ICD-10-CM | POA: Diagnosis not present

## 2015-03-24 DIAGNOSIS — E119 Type 2 diabetes mellitus without complications: Secondary | ICD-10-CM | POA: Diagnosis not present

## 2015-03-24 DIAGNOSIS — E785 Hyperlipidemia, unspecified: Secondary | ICD-10-CM

## 2015-03-24 LAB — POCT GLYCOSYLATED HEMOGLOBIN (HGB A1C): Hemoglobin A1C: 7.7

## 2015-03-24 NOTE — Patient Instructions (Signed)
These are alternative diabetes medications that we can change to if we need to: Antonietta Barcelona, Farxiga

## 2015-03-24 NOTE — Progress Notes (Signed)
Patient ID: Carl Dominguez, male   DOB: May 02, 1945, 70 y.o.   MRN: [id number]    Subjective:  HPI  Diabetes Mellitus Type II, Follow-up:   Lab Results  Component Value Date   HGBA1C 7.6 11/14/2014    Last seen for diabetes 4 months ago.  Management since then includes increase Glipizide to 10mg . He reports good compliance with treatment. He is not having side effects.  Current symptoms include none. Home blood sugar records: 150-170's  Episodes of hypoglycemia? no   Current Insulin Regimen: n/a Most Recent Eye Exam: about 3 months ago. Current exercise: none  Pertinent Labs: No results found for: CHOL, TRIG, HDL, LDLCALC, CREATININE  Wt Readings from Last 3 Encounters:  03/24/15 184 lb (83.462 kg)  12/27/14 181 lb (82.101 kg)  11/14/14 182 lb (82.555 kg)    ------------------------------------------------------------------------    Lipid/Cholesterol, Follow-up:   Last seen for this4 months ago.  Management changes since that visit include none. . Last Lipid Panel: No results found for: CHOL, TRIG, HDL, CHOLHDL, VLDL, LDLCALC, LDLDIRECT  Risk factors for vascular disease include diabetes mellitus, hypercholesterolemia and hypertension  He reports good compliance with treatment. He is not having side effects.  Current symptoms include none Weight trend: stable  Wt Readings from Last 3 Encounters:  03/24/15 184 lb (83.462 kg)  12/27/14 181 lb (82.101 kg)  11/14/14 182 lb (82.555 kg)    -------------------------------------------------------------------    Prior to Admission medications   Medication Sig Start Date End Date Taking? Authorizing Provider  aspirin 81 MG tablet Take by mouth. 04/06/10  Yes Historical Provider, MD  atorvastatin (LIPITOR) 40 MG tablet TAKE 1 TABLET AT BEDTIME 09/09/14  Yes Kalieb Freeland Maceo Pro., MD  cholecalciferol (VITAMIN D) 1000 UNITS tablet Take by mouth.   Yes Historical Provider, MD  CHOLESTYRAMINE PO Take by mouth. 07/06/12   Yes Historical Provider, MD  famotidine (PEPCID) 20 MG tablet Take by mouth.   Yes Historical Provider, MD  fluticasone (FLONASE) 50 MCG/ACT nasal spray Place into the nose. 07/31/12  Yes Historical Provider, MD  glipiZIDE (GLUCOTROL) 10 MG tablet Take 1 tablet (10 mg total) by mouth daily. 11/14/14  Yes Sheilia Reznick Maceo Pro., MD  ibuprofen (ADVIL,MOTRIN) 200 MG tablet Take by mouth.   Yes Historical Provider, MD  latanoprost (XALATAN) 0.005 % ophthalmic solution Apply to eye.   Yes Historical Provider, MD  losartan (COZAAR) 50 MG tablet Take by mouth. 06/30/14  Yes Historical Provider, MD  metoprolol succinate (TOPROL-XL) 25 MG 24 hr tablet Take by mouth. 07/26/14  Yes Historical Provider, MD  pioglitazone (ACTOS) 45 MG tablet Take by mouth. 04/25/14  Yes Historical Provider, MD  psyllium (METAMUCIL SMOOTH TEXTURE) 28 % packet Take by mouth. 11/06/13  Yes Historical Provider, MD    Patient Active Problem List   Diagnosis Date Noted  . Actinic keratosis 11/14/2014  . CAD in native artery 11/14/2014  . Cognitive complaints 11/14/2014  . Diabetes ([place]) 11/14/2014  . Essential (primary) hypertension 11/14/2014  . Gastro-esophageal reflux disease without esophagitis 11/14/2014  . Hemorrhage of gastrointestinal tract 11/14/2014  . Internal hemorrhoids 11/14/2014  . Personal history of tobacco use, presenting hazards to health 11/14/2014  . HLD (hyperlipidemia) 11/14/2014  . Allergic state 11/14/2014  . Arthritis of hand, degenerative 11/14/2014  . Allergic rhinitis, seasonal 11/14/2014  . Prostatism 11/14/2014  . Hypercholesterolemia without hypertriglyceridemia 11/14/2014  . Change in blood platelet count 11/14/2014  . Diabetes mellitus, type 2 ([place]) 11/14/2014  . Avitaminosis D 11/14/2014  Past Medical History  Diagnosis Date  . Hypertension   . Diabetes mellitus without complication ([place])   . Myocardial infarction ([place]) 1999  . Heart disease     Social History   Social History  .  Marital Status: Married    Spouse Name: N/A  . Number of Children: N/A  . Years of Education: N/A   Occupational History  . Not on file.   Social History Main Topics  . Smoking status: Former Research scientist (life sciences)  . Smokeless tobacco: Never Used     Comment: has been quit for 30 years  . Alcohol Use: Yes     Comment: occasionally  . Drug Use: No  . Sexual Activity: Not on file   Other Topics Concern  . Not on file   Social History Narrative    No Known Allergies  Review of Systems  Constitutional: Negative.   HENT: Negative.   Eyes: Negative.   Respiratory: Negative.   Cardiovascular: Negative.   Gastrointestinal: Negative.   Genitourinary: Negative.   Musculoskeletal: Negative.   Skin: Negative.   Neurological: Negative.   Endo/Heme/Allergies: Negative.   Psychiatric/Behavioral: Negative.     Immunization History  Administered Date(s) Administered  . Influenza-Unspecified 11/30/2014   Objective:  BP 122/62 mmHg  Pulse 66  Temp(Src) 98.1 F (36.7 C) (Oral)  Resp 16  Wt 184 lb (83.462 kg)  Physical Exam  Constitutional: He is oriented to person, place, and time and well-developed, well-nourished, and in no distress.  HENT:  Head: Normocephalic and atraumatic.  Right Ear: External ear normal.  Left Ear: External ear normal.  Nose: Nose normal.  Eyes: Conjunctivae and EOM are normal. Pupils are equal, round, and reactive to light.  Neck: Normal range of motion. Neck supple.  Cardiovascular: Normal rate, regular rhythm, normal heart sounds and intact distal pulses.   Pulmonary/Chest: Effort normal and breath sounds normal.  Abdominal: Soft. Bowel sounds are normal.  Musculoskeletal: Normal range of motion.  Neurological: He is alert and oriented to person, place, and time. He has normal reflexes. Gait normal. GCS score is 15.  Skin: Skin is warm and dry.  Psychiatric: Mood, memory, affect and judgment normal.    Lab Results  Component Value Date   HGBA1C 7.6  11/14/2014    CMP  No results found for: NA, K, CL, CO2, GLUCOSE, BUN, CREATININE, CALCIUM, PROT, ALBUMIN, AST, ALT, ALKPHOS, BILITOT, GFRNONAA, GFRAA  Assessment and Plan :  1. Type 2 diabetes mellitus without complication, without long-term current use of insulin (HCC) 7.7 today. Stable.  - POCT HgB A1C - Comprehensive metabolic panel  2. Essential (primary) hypertension Stable. Continue current medications. - CBC with Differential/Platelet - TSH  3. HLD (hyperlipidemia)  - Lipid Panel With LDL/HDL Ratio I have done the exam and reviewed the above chart and it is accurate to the best of my knowledge.  Patient was seen and examined by Dr. Miguel Aschoff, and noted scribed by Webb Laws, [place] MD [facility] 03/24/2015 8:42 AM

## 2015-03-25 ENCOUNTER — Telehealth: Payer: Self-pay

## 2015-03-25 LAB — CBC WITH DIFFERENTIAL/PLATELET
Basophils Absolute: 0 10*3/uL (ref 0.0–0.2)
Basos: 0 %
EOS (ABSOLUTE): 0.2 10*3/uL (ref 0.0–0.4)
Eos: 3 %
Hematocrit: 41.2 % (ref 37.5–51.0)
Hemoglobin: 13.9 g/dL (ref 12.6–17.7)
Immature Grans (Abs): 0 10*3/uL (ref 0.0–0.1)
Immature Granulocytes: 0 %
Lymphocytes Absolute: 1.7 10*3/uL (ref 0.7–3.1)
Lymphs: 20 %
MCH: 31.2 pg (ref 26.6–33.0)
MCHC: 33.7 g/dL (ref 31.5–35.7)
MCV: 92 fL (ref 79–97)
Monocytes Absolute: 0.7 10*3/uL (ref 0.1–0.9)
Monocytes: 8 %
Neutrophils Absolute: 5.6 10*3/uL (ref 1.4–7.0)
Neutrophils: 69 %
Platelets: 130 10*3/uL — ABNORMAL LOW (ref 150–379)
RBC: 4.46 x10E6/uL (ref 4.14–5.80)
RDW: 14 % (ref 12.3–15.4)
WBC: 8.2 10*3/uL (ref 3.4–10.8)

## 2015-03-25 LAB — LIPID PANEL WITH LDL/HDL RATIO
Cholesterol, Total: 119 mg/dL (ref 100–199)
HDL: 44 mg/dL (ref >39)
LDL Calculated: 45 mg/dL (ref 0–99)
LDl/HDL Ratio: 1 ratio units (ref 0.0–3.6)
Triglycerides: 148 mg/dL (ref 0–149)
VLDL Cholesterol Cal: 30 mg/dL (ref 5–40)

## 2015-03-25 LAB — COMPREHENSIVE METABOLIC PANEL
ALT: 20 IU/L (ref 0–44)
AST: 22 IU/L (ref 0–40)
Albumin/Globulin Ratio: 1.7 (ref 1.1–2.5)
Albumin: 3.8 g/dL (ref 3.5–4.8)
Alkaline Phosphatase: 63 IU/L (ref 39–117)
BUN/Creatinine Ratio: 13 (ref 10–22)
BUN: 15 mg/dL (ref 8–27)
Bilirubin Total: 1.1 mg/dL (ref 0.0–1.2)
CO2: 25 mmol/L (ref 18–29)
Calcium: 8.7 mg/dL (ref 8.6–10.2)
Chloride: 103 mmol/L (ref 96–106)
Creatinine, Ser: 1.12 mg/dL (ref 0.76–1.27)
GFR calc Af Amer: 76 mL/min/{1.73_m2} (ref >59)
GFR calc non Af Amer: 66 mL/min/{1.73_m2} (ref >59)
Globulin, Total: 2.3 g/dL (ref 1.5–4.5)
Glucose: 208 mg/dL — ABNORMAL HIGH (ref 65–99)
Potassium: 4.9 mmol/L (ref 3.5–5.2)
Sodium: 142 mmol/L (ref 134–144)
Total Protein: 6.1 g/dL (ref 6.0–8.5)

## 2015-03-25 LAB — TSH: TSH: 3.88 u[IU]/mL (ref 0.450–4.500)

## 2015-03-25 NOTE — Telephone Encounter (Signed)
-----   Message from Jerrol Banana., MD sent at 03/25/2015 10:26 AM EST ----- Labs all stable. Platelets barely low. We'll repeat CBC on next visit.

## 2015-03-25 NOTE — Telephone Encounter (Signed)
Pt advised.   Thanks,   -Santanna Whitford  

## 2015-03-27 DIAGNOSIS — E669 Obesity, unspecified: Secondary | ICD-10-CM | POA: Diagnosis not present

## 2015-03-27 DIAGNOSIS — R011 Cardiac murmur, unspecified: Secondary | ICD-10-CM | POA: Diagnosis not present

## 2015-03-27 DIAGNOSIS — R001 Bradycardia, unspecified: Secondary | ICD-10-CM | POA: Diagnosis not present

## 2015-03-27 DIAGNOSIS — R079 Chest pain, unspecified: Secondary | ICD-10-CM | POA: Diagnosis not present

## 2015-03-27 DIAGNOSIS — I10 Essential (primary) hypertension: Secondary | ICD-10-CM | POA: Diagnosis not present

## 2015-03-27 DIAGNOSIS — E785 Hyperlipidemia, unspecified: Secondary | ICD-10-CM | POA: Diagnosis not present

## 2015-03-27 DIAGNOSIS — I209 Angina pectoris, unspecified: Secondary | ICD-10-CM | POA: Diagnosis not present

## 2015-03-27 DIAGNOSIS — E119 Type 2 diabetes mellitus without complications: Secondary | ICD-10-CM | POA: Diagnosis not present

## 2015-03-27 DIAGNOSIS — I2510 Atherosclerotic heart disease of native coronary artery without angina pectoris: Secondary | ICD-10-CM | POA: Diagnosis not present

## 2015-04-29 ENCOUNTER — Telehealth: Payer: Self-pay | Admitting: Family Medicine

## 2015-04-29 MED ORDER — PIOGLITAZONE HCL 45 MG PO TABS: 45.0000 mg | ORAL_TABLET | Freq: Every day | ORAL | Status: DC

## 2015-04-29 NOTE — Telephone Encounter (Signed)
Pt needs new refill on pioglitazone (ACTOS) 45 MG tablet  90dx with 3 refills.  [facility] mail order  Pt call back [phone]  Thanks Con Memos

## 2015-04-29 NOTE — Telephone Encounter (Signed)
rx sent in-aa 

## 2015-07-01 ENCOUNTER — Other Ambulatory Visit: Payer: Self-pay | Admitting: Family Medicine

## 2015-07-08 DIAGNOSIS — H401131 Primary open-angle glaucoma, bilateral, mild stage: Secondary | ICD-10-CM | POA: Diagnosis not present

## 2015-07-09 ENCOUNTER — Ambulatory Visit (INDEPENDENT_AMBULATORY_CARE_PROVIDER_SITE_OTHER): Payer: Medicare Other | Admitting: Family Medicine

## 2015-07-09 ENCOUNTER — Encounter: Payer: Self-pay | Admitting: Family Medicine

## 2015-07-09 VITALS — BP 108/66 | HR 64 | Temp 97.5°F | Resp 16 | Wt 181.0 lb

## 2015-07-09 DIAGNOSIS — M76892 Other specified enthesopathies of left lower limb, excluding foot: Secondary | ICD-10-CM

## 2015-07-09 DIAGNOSIS — M6580 Other synovitis and tenosynovitis, unspecified site: Secondary | ICD-10-CM | POA: Diagnosis not present

## 2015-07-09 MED ORDER — NAPROXEN 500 MG PO TABS: 500.0000 mg | ORAL_TABLET | Freq: Two times a day (BID) | ORAL | Status: DC

## 2015-07-09 NOTE — Progress Notes (Signed)
Patient ID: Carl Dominguez, male   DOB: 1945/03/19, 70 y.o.   MRN: [id number]    Subjective:  HPI Pt is here today for left knee pain. It started 2 days ago. He does not remember injurying it in any way. He did play golf the day it started and noticed it was tender to the touch that afternoon and his neighbor noticed he was "hopping" a little bit but this morning when he got up, he is "hopping" worse and is swollen. Hurts to bare weight and to straighten out the knee. The knee does not look red.  Prior to Admission medications   Medication Sig Start Date End Date Taking? Authorizing Provider  aspirin 81 MG tablet Take by mouth. 04/06/10  Yes Historical Provider, MD  atorvastatin (LIPITOR) 40 MG tablet TAKE 1 TABLET AT BEDTIME 09/09/14  Yes Richard Maceo Pro., MD  cholecalciferol (VITAMIN D) 1000 UNITS tablet Take by mouth.   Yes Historical Provider, MD  CHOLESTYRAMINE PO Take by mouth. 07/06/12  Yes Historical Provider, MD  famotidine (PEPCID) 20 MG tablet Take by mouth.   Yes Historical Provider, MD  fluticasone (FLONASE) 50 MCG/ACT nasal spray Place into the nose. 07/31/12  Yes Historical Provider, MD  glipiZIDE (GLUCOTROL) 10 MG tablet Take 1 tablet (10 mg total) by mouth daily. 11/14/14  Yes Richard Maceo Pro., MD  ibuprofen (ADVIL,MOTRIN) 200 MG tablet Take by mouth.   Yes Historical Provider, MD  latanoprost (XALATAN) 0.005 % ophthalmic solution Apply to eye.   Yes Historical Provider, MD  losartan (COZAAR) 50 MG tablet take 1 tablet by mouth daily 07/01/15  Yes Richard Maceo Pro., MD  metoprolol succinate (TOPROL-XL) 25 MG 24 hr tablet Take by mouth. 07/26/14  Yes Historical Provider, MD  pioglitazone (ACTOS) 45 MG tablet Take 1 tablet (45 mg total) by mouth daily. 04/29/15  Yes Richard Maceo Pro., MD  psyllium (METAMUCIL SMOOTH TEXTURE) 28 % packet Take by mouth. 11/06/13  Yes Historical Provider, MD    Patient Active Problem List   Diagnosis Date Noted  . Actinic keratosis 11/14/2014   . CAD in native artery 11/14/2014  . Cognitive complaints 11/14/2014  . Diabetes ([place]) 11/14/2014  . Essential (primary) hypertension 11/14/2014  . Gastro-esophageal reflux disease without esophagitis 11/14/2014  . Hemorrhage of gastrointestinal tract 11/14/2014  . Internal hemorrhoids 11/14/2014  . Personal history of tobacco use, presenting hazards to health 11/14/2014  . HLD (hyperlipidemia) 11/14/2014  . Allergic state 11/14/2014  . Arthritis of hand, degenerative 11/14/2014  . Allergic rhinitis, seasonal 11/14/2014  . Prostatism 11/14/2014  . Hypercholesterolemia without hypertriglyceridemia 11/14/2014  . Change in blood platelet count 11/14/2014  . Diabetes mellitus, type 2 ([place]) 11/14/2014  . Avitaminosis D 11/14/2014    Past Medical History  Diagnosis Date  . Hypertension   . Diabetes mellitus without complication ([place])   . Myocardial infarction ([place]) 1999  . Heart disease     Social History   Social History  . Marital Status: Married    Spouse Name: N/A  . Number of Children: N/A  . Years of Education: N/A   Occupational History  . Not on file.   Social History Main Topics  . Smoking status: Former Research scientist (life sciences)  . Smokeless tobacco: Never Used     Comment: has been quit for 30 years  . Alcohol Use: Yes     Comment: occasionally  . Drug Use: No  . Sexual Activity: Not on file   Other Topics Concern  .  Not on file   Social History Narrative    No Known Allergies  Review of Systems  Constitutional: Negative.   HENT: Negative.   Eyes: Negative.   Respiratory: Negative.   Cardiovascular: Negative.   Gastrointestinal: Negative.   Genitourinary: Negative.   Musculoskeletal: Positive for joint pain.  Skin: Negative.   Neurological: Negative.   Endo/Heme/Allergies: Negative.   Psychiatric/Behavioral: Negative.     Immunization History  Administered Date(s) Administered  . Influenza-Unspecified 11/30/2014   Objective:  BP 108/66 mmHg  Pulse 64   Temp(Src) 97.5 F (36.4 C) (Oral)  Resp 16  Wt 181 lb (82.101 kg)  Physical Exam  Constitutional: He is well-developed, well-nourished, and in no distress.  Musculoskeletal: He exhibits edema and tenderness (where quadraceips connects to patella).  Skin: Skin is warm and dry.  Psychiatric: Mood, memory and affect normal.   there is mild lash minimal effusion there appears to be  No detachment of the quadriceps from the patella  Lab Results  Component Value Date   WBC 8.2 03/24/2015   HCT 41.2 03/24/2015   PLT 130* 03/24/2015   GLUCOSE 208* 03/24/2015   CHOL 119 03/24/2015   TRIG 148 03/24/2015   HDL 44 03/24/2015   LDLCALC 45 03/24/2015   TSH 3.880 03/24/2015   HGBA1C 7.7 03/24/2015    CMP     Component Value Date/Time   NA 142 03/24/2015 0915   K 4.9 03/24/2015 0915   CL 103 03/24/2015 0915   CO2 25 03/24/2015 0915   GLUCOSE 208* 03/24/2015 0915   BUN 15 03/24/2015 0915   CREATININE 1.12 03/24/2015 0915   CALCIUM 8.7 03/24/2015 0915   PROT 6.1 03/24/2015 0915   ALBUMIN 3.8 03/24/2015 0915   AST 22 03/24/2015 0915   ALT 20 03/24/2015 0915   ALKPHOS 63 03/24/2015 0915   BILITOT 1.1 03/24/2015 0915   GFRNONAA 66 03/24/2015 0915   GFRAA 76 03/24/2015 0915    Assessment and Plan :  1. Tendonitis of knee, left Ice for the next 3 days, then heating pad until routine follow up next week.  Left quadriceps tendon. - naproxen (NAPROSYN) 500 MG tablet; Take 1 tablet (500 mg total) by mouth 2 (two) times daily with a meal.  Dispense: 60 tablet; Refill: 5   Patient was seen and examined by Dr. Miguel Aschoff, and noted scribed by Webb Laws, [place] MD [facility] 07/09/2015 10:22 AM

## 2015-07-09 NOTE — Patient Instructions (Signed)
Ice until the weekend, then heating pad until follow up next week.

## 2015-07-15 ENCOUNTER — Ambulatory Visit (INDEPENDENT_AMBULATORY_CARE_PROVIDER_SITE_OTHER): Payer: Medicare Other | Admitting: Family Medicine

## 2015-07-15 ENCOUNTER — Encounter: Payer: Self-pay | Admitting: Family Medicine

## 2015-07-15 VITALS — BP 122/62 | HR 68 | Temp 97.7°F | Resp 16 | Wt 181.0 lb

## 2015-07-15 DIAGNOSIS — E119 Type 2 diabetes mellitus without complications: Secondary | ICD-10-CM | POA: Diagnosis not present

## 2015-07-15 DIAGNOSIS — I10 Essential (primary) hypertension: Secondary | ICD-10-CM

## 2015-07-15 DIAGNOSIS — M6580 Other synovitis and tenosynovitis, unspecified site: Secondary | ICD-10-CM | POA: Diagnosis not present

## 2015-07-15 DIAGNOSIS — M76892 Other specified enthesopathies of left lower limb, excluding foot: Secondary | ICD-10-CM

## 2015-07-15 DIAGNOSIS — D696 Thrombocytopenia, unspecified: Secondary | ICD-10-CM

## 2015-07-15 LAB — POCT GLYCOSYLATED HEMOGLOBIN (HGB A1C): Hemoglobin A1C: 8

## 2015-07-15 LAB — POCT UA - MICROALBUMIN: Microalbumin Ur, POC: 20 mg/L

## 2015-07-15 NOTE — Progress Notes (Signed)
Patient ID: Carl Dominguez, male   DOB: March 14, 1945, 70 y.o.   MRN: [id number]    Subjective:  HPI  Diabetes Mellitus Type II, Follow-up:   Lab Results  Component Value Date   HGBA1C 7.7 03/24/2015   HGBA1C 7.6 11/14/2014    Last seen for diabetes 4 months ago.  Management since then includes none. He reports good compliance with treatment. He is not having side effects.  Current symptoms include none and have been unchanged. Home blood sugar records: 150-200. Has been lower in the last 3 mornings.  Episodes of hypoglycemia? no   Current Insulin Regimen: n/a Most Recent Eye Exam: Within the last year. Current exercise: plays golf  Pertinent Labs:    Component Value Date/Time   CHOL 119 03/24/2015 0915   TRIG 148 03/24/2015 0915   HDL 44 03/24/2015 0915   LDLCALC 45 03/24/2015 0915   CREATININE 1.12 03/24/2015 0915    Wt Readings from Last 3 Encounters:  07/15/15 181 lb (82.101 kg)  07/09/15 181 lb (82.101 kg)  03/24/15 184 lb (83.462 kg)    ------------------------------------------------------------------------   Hypertension, follow-up:  BP Readings from Last 3 Encounters:  07/15/15 122/62  07/09/15 108/66  03/24/15 122/62    He was last seen for hypertension 4 months ago.  BP at that visit was 108/66. Management since that visit includes none. He reports good compliance with treatment. He is not having side effects.  He is exercising. Outside blood pressures are not being checked. He is experiencing none.  Patient denies chest pain, chest pressure/discomfort, claudication, dyspnea, exertional chest pressure/discomfort, fatigue, irregular heart beat, lower extremity edema, near-syncope, orthopnea and palpitations.     Wt Readings from Last 3 Encounters:  07/15/15 181 lb (82.101 kg)  07/09/15 181 lb (82.101 kg)  03/24/15 184 lb (83.462 kg)   ------------------------------------------------------------------------  Tendonitis of left knee- Pt  reports that his knee is completely better. He is still taking naproxen.    On last last labs on 03/24/15 pt platelets were barely low- repeat CBC next OV.    Prior to Admission medications   Medication Sig Start Date End Date Taking? Authorizing Provider  aspirin 81 MG tablet Take by mouth. 04/06/10  Yes Historical Provider, MD  atorvastatin (LIPITOR) 40 MG tablet TAKE 1 TABLET AT BEDTIME 09/09/14  Yes Donnesha Karg Maceo Pro., MD  cholecalciferol (VITAMIN D) 1000 UNITS tablet Take by mouth.   Yes Historical Provider, MD  CHOLESTYRAMINE PO Take by mouth. 07/06/12  Yes Historical Provider, MD  famotidine (PEPCID) 20 MG tablet Take by mouth.   Yes Historical Provider, MD  fluticasone (FLONASE) 50 MCG/ACT nasal spray Place into the nose. 07/31/12  Yes Historical Provider, MD  glipiZIDE (GLUCOTROL) 10 MG tablet Take 1 tablet (10 mg total) by mouth daily. 11/14/14  Yes Xia Stohr Maceo Pro., MD  ibuprofen (ADVIL,MOTRIN) 200 MG tablet Take by mouth.   Yes Historical Provider, MD  latanoprost (XALATAN) 0.005 % ophthalmic solution Apply to eye.   Yes Historical Provider, MD  losartan (COZAAR) 50 MG tablet take 1 tablet by mouth daily 07/01/15  Yes Lajeana Strough Maceo Pro., MD  metoprolol succinate (TOPROL-XL) 25 MG 24 hr tablet Take by mouth. 07/26/14  Yes Historical Provider, MD  naproxen (NAPROSYN) 500 MG tablet Take 1 tablet (500 mg total) by mouth 2 (two) times daily with a meal. 07/09/15  Yes Jerrol Banana., MD  pioglitazone (ACTOS) 45 MG tablet Take 1 tablet (45 mg total) by mouth daily. 04/29/15  Yes Jerrol Banana., MD  psyllium (METAMUCIL SMOOTH TEXTURE) 28 % packet Take by mouth. 11/06/13  Yes Historical Provider, MD    Patient Active Problem List   Diagnosis Date Noted  . Actinic keratosis 11/14/2014  . CAD in native artery 11/14/2014  . Cognitive complaints 11/14/2014  . Diabetes ([place]) 11/14/2014  . Essential (primary) hypertension 11/14/2014  . Gastro-esophageal reflux disease without  esophagitis 11/14/2014  . Hemorrhage of gastrointestinal tract 11/14/2014  . Internal hemorrhoids 11/14/2014  . Personal history of tobacco use, presenting hazards to health 11/14/2014  . HLD (hyperlipidemia) 11/14/2014  . Allergic state 11/14/2014  . Arthritis of hand, degenerative 11/14/2014  . Allergic rhinitis, seasonal 11/14/2014  . Prostatism 11/14/2014  . Hypercholesterolemia without hypertriglyceridemia 11/14/2014  . Change in blood platelet count 11/14/2014  . Diabetes mellitus, type 2 ([place]) 11/14/2014  . Avitaminosis D 11/14/2014    Past Medical History  Diagnosis Date  . Hypertension   . Diabetes mellitus without complication ([place])   . Myocardial infarction ([place]) 1999  . Heart disease     Social History   Social History  . Marital Status: Married    Spouse Name: N/A  . Number of Children: N/A  . Years of Education: N/A   Occupational History  . Not on file.   Social History Main Topics  . Smoking status: Former Research scientist (life sciences)  . Smokeless tobacco: Never Used     Comment: has been quit for 30 years  . Alcohol Use: Yes     Comment: occasionally  . Drug Use: No  . Sexual Activity: Not on file   Other Topics Concern  . Not on file   Social History Narrative    No Known Allergies  Review of Systems  Constitutional: Negative.   HENT: Negative.   Eyes: Negative.   Respiratory: Negative.   Cardiovascular: Negative.   Gastrointestinal: Negative.   Genitourinary: Negative.   Musculoskeletal: Positive for joint pain.  Skin: Negative.   Neurological: Negative.   Endo/Heme/Allergies: Negative.   Psychiatric/Behavioral: Negative.     Immunization History  Administered Date(s) Administered  . Influenza-Unspecified 11/30/2014   Objective:  BP 122/62 mmHg  Pulse 68  Temp(Src) 97.7 F (36.5 C) (Oral)  Resp 16  Wt 181 lb (82.101 kg)  Physical Exam  Constitutional: He is oriented to person, place, and time and well-developed, well-nourished, and in no  distress.  HENT:  Head: Normocephalic and atraumatic.  Right Ear: External ear normal.  Left Ear: External ear normal.  Nose: Nose normal.  Eyes: Conjunctivae and EOM are normal. Pupils are equal, round, and reactive to light.  Neck: Normal range of motion. Neck supple.  Cardiovascular: Normal rate, regular rhythm, normal heart sounds and intact distal pulses.   Pulmonary/Chest: Effort normal and breath sounds normal.  Abdominal: Soft.  Musculoskeletal: Normal range of motion.  Neurological: He is alert and oriented to person, place, and time. He has normal reflexes. Gait normal. GCS score is 15.  Skin: Skin is warm and dry.  Psychiatric: Mood, memory, affect and judgment normal.     Diabetic Foot Exam - Simple   Simple Foot Form  Diabetic Foot exam was performed with the following findings:  Yes 07/15/2015  8:45 AM  Visual Inspection  No deformities, no ulcerations, no other skin breakdown bilaterally:  Yes  See comments:  Yes  Sensation Testing  Intact to touch and monofilament testing bilaterally:  Yes  See comments:  Yes  Pulse Check  Posterior Tibialis and Dorsalis  pulse intact bilaterally:  Yes  See comments:  Yes  Comments      Lab Results  Component Value Date   WBC 8.2 03/24/2015   HCT 41.2 03/24/2015   PLT 130* 03/24/2015   GLUCOSE 208* 03/24/2015   CHOL 119 03/24/2015   TRIG 148 03/24/2015   HDL 44 03/24/2015   LDLCALC 45 03/24/2015   TSH 3.880 03/24/2015   HGBA1C 7.7 03/24/2015    CMP     Component Value Date/Time   NA 142 03/24/2015 0915   K 4.9 03/24/2015 0915   CL 103 03/24/2015 0915   CO2 25 03/24/2015 0915   GLUCOSE 208* 03/24/2015 0915   BUN 15 03/24/2015 0915   CREATININE 1.12 03/24/2015 0915   CALCIUM 8.7 03/24/2015 0915   PROT 6.1 03/24/2015 0915   ALBUMIN 3.8 03/24/2015 0915   AST 22 03/24/2015 0915   ALT 20 03/24/2015 0915   ALKPHOS 63 03/24/2015 0915   BILITOT 1.1 03/24/2015 0915   GFRNONAA 66 03/24/2015 0915   GFRAA 76  03/24/2015 0915    Assessment and Plan :  1. Essential (primary) hypertension Stable.   2. Type 2 diabetes mellitus without complication, without long-term current use of insulin (HCC)  More than 50% of this visit spent in counseling regarding these multiple issues. - POCT HgB A1C-- 8.0 today. Gradually worsening. Pt agreed to work on diet and exercise. May need medication added in the future.   - POCT UA - Microalbumin  3. Tendonitis of knee, left Resolved. Patient instructed in limiting Naprosyn use.  4. Thrombocytopenia ([place]) May need hematology workup/referral. - CBC with Differential/Platelet 5. Hypertension Renal protection with ARB  Patient was seen and examined by Dr. Miguel Aschoff, and noted scribed by Webb Laws, [place] MD [facility] 07/15/2015 8:18 AM

## 2015-07-16 LAB — CBC WITH DIFFERENTIAL/PLATELET
Basophils Absolute: 0 10*3/uL (ref 0.0–0.2)
Basos: 0 %
EOS (ABSOLUTE): 0.2 10*3/uL (ref 0.0–0.4)
Eos: 3 %
Hematocrit: 40.7 % (ref 37.5–51.0)
Hemoglobin: 13.5 g/dL (ref 12.6–17.7)
Immature Grans (Abs): 0 10*3/uL (ref 0.0–0.1)
Immature Granulocytes: 0 %
Lymphocytes Absolute: 1.6 10*3/uL (ref 0.7–3.1)
Lymphs: 22 %
MCH: 31.4 pg (ref 26.6–33.0)
MCHC: 33.2 g/dL (ref 31.5–35.7)
MCV: 95 fL (ref 79–97)
Monocytes Absolute: 0.6 10*3/uL (ref 0.1–0.9)
Monocytes: 8 %
Neutrophils Absolute: 4.8 10*3/uL (ref 1.4–7.0)
Neutrophils: 67 %
Platelets: 131 10*3/uL — ABNORMAL LOW (ref 150–379)
RBC: 4.3 x10E6/uL (ref 4.14–5.80)
RDW: 13.8 % (ref 12.3–15.4)
WBC: 7.1 10*3/uL (ref 3.4–10.8)

## 2015-07-23 ENCOUNTER — Other Ambulatory Visit: Payer: Self-pay | Admitting: Physician Assistant

## 2015-07-23 DIAGNOSIS — I10 Essential (primary) hypertension: Secondary | ICD-10-CM

## 2015-07-23 MED ORDER — METOPROLOL SUCCINATE ER 25 MG PO TB24
25.0000 mg | ORAL_TABLET | Freq: Every day | ORAL | Status: DC
Start: 2015-07-23 — End: 2016-01-18

## 2015-07-24 ENCOUNTER — Other Ambulatory Visit: Payer: Self-pay

## 2015-07-24 ENCOUNTER — Ambulatory Visit: Payer: Medicare Other | Admitting: Family Medicine

## 2015-08-24 ENCOUNTER — Other Ambulatory Visit: Payer: Self-pay | Admitting: Family Medicine

## 2015-08-25 ENCOUNTER — Other Ambulatory Visit: Payer: Self-pay | Admitting: Family Medicine

## 2015-08-25 MED ORDER — ATORVASTATIN CALCIUM 40 MG PO TABS: 40.0000 mg | ORAL_TABLET | Freq: Every day | ORAL | Status: DC

## 2015-08-25 NOTE — Telephone Encounter (Signed)
Pt requesing refill of atorvastatin (LIPITOR) 40 MG tablet sent to mail order [facility] 90 day supply

## 2015-08-25 NOTE — Telephone Encounter (Signed)
Done-aa 

## 2015-11-05 ENCOUNTER — Ambulatory Visit (INDEPENDENT_AMBULATORY_CARE_PROVIDER_SITE_OTHER): Payer: Medicare Other | Admitting: Family Medicine

## 2015-11-05 ENCOUNTER — Encounter: Payer: Self-pay | Admitting: Family Medicine

## 2015-11-05 VITALS — BP 110/58 | HR 60 | Temp 97.4°F | Resp 16 | Ht 68.0 in | Wt 175.0 lb

## 2015-11-05 DIAGNOSIS — Z0000 Encounter for general adult medical examination without abnormal findings: Secondary | ICD-10-CM | POA: Diagnosis not present

## 2015-11-05 DIAGNOSIS — E119 Type 2 diabetes mellitus without complications: Secondary | ICD-10-CM | POA: Diagnosis not present

## 2015-11-05 DIAGNOSIS — I10 Essential (primary) hypertension: Secondary | ICD-10-CM

## 2015-11-05 DIAGNOSIS — Z1211 Encounter for screening for malignant neoplasm of colon: Secondary | ICD-10-CM

## 2015-11-05 LAB — POCT GLYCOSYLATED HEMOGLOBIN (HGB A1C): Hemoglobin A1C: 7

## 2015-11-05 LAB — IFOBT (OCCULT BLOOD): IFOBT: NEGATIVE

## 2015-11-05 NOTE — Progress Notes (Signed)
Patient: Carl Dominguez, Male    DOB: 25-Dec-1945, 70 y.o.   MRN: [id number] Visit Date: 11/05/2015  Today's Provider: Megan Mansichard Bryndle Corredor Jr, MD   Chief Complaint  Patient presents with  . Medicare Wellness   Subjective:   Carl Dominguez is a 70 y.o. male who presents today for his Subsequent Annual Wellness Visit. He feels well. He reports exercising 1-2 times a week. He reports he is sleeping fairly well. Blood sugar has been well controlled. He has been having right neck pain for the past 6 weeks. He has difficulty turning his head to the left. No pain in the shoulder or  down the arms Colonoscopy-08/07/07  Immunization History  Administered Date(s) Administered  . Influenza-Unspecified 11/30/2014  . Pneumococcal Conjugate-13 12/07/2012, 03/14/2014  . Pneumococcal Polysaccharide-23 02/03/2005, 12/13/2008  . Tdap 12/06/2007  . Zoster 12/31/2010     Review of Systems  Constitutional: Negative.   HENT: Negative.   Eyes: Negative.   Respiratory: Negative.   Cardiovascular: Negative.   Gastrointestinal: Negative.   Endocrine: Negative.   Genitourinary: Negative.   Musculoskeletal: Positive for neck pain.  Skin: Negative.   Allergic/Immunologic: Negative.   Neurological: Negative.   Hematological: Negative.   Psychiatric/Behavioral: Positive for agitation.    Patient Active Problem List   Diagnosis Date Noted  . Actinic keratosis 11/14/2014  . CAD in native artery 11/14/2014  . Cognitive complaints 11/14/2014  . Diabetes (HCC) 11/14/2014  . Essential (primary) hypertension 11/14/2014  . Gastro-esophageal reflux disease without esophagitis 11/14/2014  . Hemorrhage of gastrointestinal tract 11/14/2014  . Internal hemorrhoids 11/14/2014  . Personal history of tobacco use, presenting hazards to health 11/14/2014  . HLD (hyperlipidemia) 11/14/2014  . Allergic state 11/14/2014  . Arthritis of hand, degenerative 11/14/2014  . Allergic rhinitis, seasonal 11/14/2014  . Prostatism  11/14/2014  . Hypercholesterolemia without hypertriglyceridemia 11/14/2014  . Change in blood platelet count 11/14/2014  . Diabetes mellitus, type 2 (HCC) 11/14/2014  . Avitaminosis D 11/14/2014    Social History   Social History  . Marital status: Married    Spouse name: N/A  . Number of children: N/A  . Years of education: N/A   Occupational History  . Not on file.   Social History Main Topics  . Smoking status: Former Games developermoker  . Smokeless tobacco: Never Used     Comment: has been quit for 30 years  . Alcohol use Yes     Comment: occasionally  . Drug use: No  . Sexual activity: Not on file   Other Topics Concern  . Not on file   Social History Narrative  . No narrative on file    Past Surgical History:  Procedure Laterality Date  . CHOLECYSTECTOMY  2010  . CORONARY ANGIOPLASTY WITH STENT PLACEMENT  1999  . HERNIA REPAIR      His family history is not on file.    Outpatient Medications Prior to Visit  Medication Sig Dispense Refill  . aspirin 81 MG tablet Take by mouth.    Marland Kitchen. atorvastatin (LIPITOR) 40 MG tablet Take 1 tablet (40 mg total) by mouth at bedtime. 90 tablet 3  . cholecalciferol (VITAMIN D) 1000 UNITS tablet Take by mouth.    . CHOLESTYRAMINE PO Take by mouth.    . famotidine (PEPCID) 20 MG tablet Take by mouth.    . fluticasone (FLONASE) 50 MCG/ACT nasal spray Place into the nose.    Marland Kitchen. glipiZIDE (GLUCOTROL) 10 MG tablet Take 1 tablet (10 mg total) by mouth  daily. 90 tablet 3  . latanoprost (XALATAN) 0.005 % ophthalmic solution Apply to eye.    . losartan (COZAAR) 50 MG tablet take 1 tablet by mouth daily 90 tablet 4  . metoprolol succinate (TOPROL-XL) 25 MG 24 hr tablet Take 1 tablet (25 mg total) by mouth daily. 90 tablet 1  . pioglitazone (ACTOS) 45 MG tablet Take 1 tablet (45 mg total) by mouth daily. 90 tablet 3  . psyllium (METAMUCIL SMOOTH TEXTURE) 28 % packet Take by mouth.    Marland Kitchen ibuprofen (ADVIL,MOTRIN) 200 MG tablet Take by mouth.    .  naproxen (NAPROSYN) 500 MG tablet Take 1 tablet (500 mg total) by mouth 2 (two) times daily with a meal. (Patient not taking: Reported on 11/05/2015) 60 tablet 5   No facility-administered medications prior to visit.     No Known Allergies  Patient Care Team: Jerrol Banana., MD as PCP - General (Family Medicine)  Objective:   Vitals:  Vitals:   11/05/15 0953  BP: (!) 110/58  Pulse: 60  Resp: 16  Temp: 97.4 F (36.3 C)  TempSrc: Oral  Weight: 175 lb (79.4 kg)  Height: 5\' 8"  (1.727 m)    Physical Exam  Constitutional: He is oriented to person, place, and time. He appears well-developed and well-nourished.  HENT:  Head: Normocephalic and atraumatic.  Right Ear: External ear normal.  Left Ear: External ear normal.  Mouth/Throat: Oropharynx is clear and moist.  Eyes: Conjunctivae and EOM are normal. Pupils are equal, round, and reactive to light.  Neck: Normal range of motion. Neck supple.  Cardiovascular: Normal rate, regular rhythm, normal heart sounds and intact distal pulses.   Pulmonary/Chest: Effort normal and breath sounds normal.  Abdominal: Soft. Bowel sounds are normal.  Genitourinary: Rectum normal, prostate normal and penis normal.  Musculoskeletal: Normal range of motion.  Neurological: He is alert and oriented to person, place, and time. He has normal reflexes.  Skin: Skin is warm and dry.  Psychiatric: He has a normal mood and affect. His behavior is normal. Judgment and thought content normal.    Activities of Daily Living In your present state of health, do you have any difficulty performing the following activities: 11/05/2015 03/24/2015  Hearing? N N  Vision? Y N  Difficulty concentrating or making decisions? N N  Walking or climbing stairs? N N  Dressing or bathing? N N  Doing errands, shopping? N N  Some recent data might be hidden    Fall Risk Assessment Fall Risk  11/05/2015 03/24/2015 11/14/2014 11/14/2014  Falls in the past year? No No No No      Depression Screen PHQ 2/9 Scores 11/05/2015 03/24/2015 11/14/2014  PHQ - 2 Score 0 0 0    Cognitive Testing - 6-CIT    Year: 0 points  Month: 0 points  Memorize "Pia Mau, [address] [place]"  Time (within 1 hour:) 0 points  Count backwards from 20: 0  points  Name months of year: 0  points  Repeat Address: 4  points   Total Score: 4/28  Interpretation : Normal (0-7) Abnormal (8-28)    Assessment & Plan:     Annual Wellness Visit  Reviewed patient's Family Medical History Reviewed and updated list of patient's medical providers Assessment of cognitive impairment was done Assessed patient's functional ability Established a written schedule for health screening [place] Completed and Reviewed  Exercise Activities and Dietary recommendations Goals    None  Immunization History  Administered Date(s) Administered  . Influenza-Unspecified 11/30/2014  . Pneumococcal Conjugate-13 12/07/2012, 03/14/2014  . Pneumococcal Polysaccharide-23 02/03/2005, 12/13/2008  . Tdap 12/06/2007  . Zoster 12/31/2010    Health Maintenance  Topic Date Due  . Hepatitis C Screening  [birth year]  . OPHTHALMOLOGY EXAM  03/28/1953  . INFLUENZA VACCINE  02/21/2016 (Originally 09/30/2015)  . HEMOGLOBIN A1C  01/15/2016  . FOOT EXAM  07/14/2016  . COLONOSCOPY  08/06/2017  . TETANUS/TDAP  12/05/2017  . ZOSTAVAX  Completed  . PNA vac Low Risk Adult  Completed   1. Medicare annual wellness visit, subsequent  - POCT HgB A1C- 7.0 was 8.0. Improved. Continue diet and exercise.   2. Essential (primary) hypertension stable  3. Type 2 diabetes mellitus without complication, without long-term current use of insulin (HCC) A1C is 7.0 today  4. Colon cancer screening  - IFOBT POC (occult bld, rslt in office) 5. Torticollis Presently I think this is all muscle spasm. We'll follow expectantly. HPI, Exam, and A&P Transcribed under the direction and in the presence  of Pritesh Sobecki L. Cranford Mon, MD  Electronically Signed: Webb Laws, CMA    Discussed health benefits of physical activity, and encouraged him to engage in regular exercise appropriate for his age and condition.    Miguel Aschoff MD [facility] 11/05/2015 9:59 AM  ------------------------------------------------------------------------------------------------------------

## 2015-11-12 ENCOUNTER — Other Ambulatory Visit: Payer: Self-pay | Admitting: Family Medicine

## 2015-11-12 DIAGNOSIS — E119 Type 2 diabetes mellitus without complications: Secondary | ICD-10-CM

## 2015-12-02 DIAGNOSIS — Z23 Encounter for immunization: Secondary | ICD-10-CM | POA: Diagnosis not present

## 2015-12-23 ENCOUNTER — Encounter: Payer: Self-pay | Admitting: Family Medicine

## 2016-01-05 DIAGNOSIS — H401131 Primary open-angle glaucoma, bilateral, mild stage: Secondary | ICD-10-CM | POA: Diagnosis not present

## 2016-01-09 ENCOUNTER — Encounter: Payer: Self-pay | Admitting: Family Medicine

## 2016-01-14 DIAGNOSIS — L570 Actinic keratosis: Secondary | ICD-10-CM | POA: Diagnosis not present

## 2016-01-14 DIAGNOSIS — D2339 Other benign neoplasm of skin of other parts of face: Secondary | ICD-10-CM | POA: Diagnosis not present

## 2016-01-14 DIAGNOSIS — D1801 Hemangioma of skin and subcutaneous tissue: Secondary | ICD-10-CM | POA: Diagnosis not present

## 2016-01-14 DIAGNOSIS — D2362 Other benign neoplasm of skin of left upper limb, including shoulder: Secondary | ICD-10-CM | POA: Diagnosis not present

## 2016-01-14 DIAGNOSIS — L814 Other melanin hyperpigmentation: Secondary | ICD-10-CM | POA: Diagnosis not present

## 2016-01-18 ENCOUNTER — Other Ambulatory Visit: Payer: Self-pay | Admitting: Physician Assistant

## 2016-01-18 DIAGNOSIS — I10 Essential (primary) hypertension: Secondary | ICD-10-CM

## 2016-03-09 DIAGNOSIS — H2513 Age-related nuclear cataract, bilateral: Secondary | ICD-10-CM | POA: Diagnosis not present

## 2016-03-10 ENCOUNTER — Ambulatory Visit (INDEPENDENT_AMBULATORY_CARE_PROVIDER_SITE_OTHER): Payer: Medicare Other | Admitting: Family Medicine

## 2016-03-10 ENCOUNTER — Encounter: Payer: Self-pay | Admitting: *Deleted

## 2016-03-10 VITALS — BP 102/58 | HR 80 | Temp 97.9°F | Resp 16 | Wt 175.0 lb

## 2016-03-10 DIAGNOSIS — G8929 Other chronic pain: Secondary | ICD-10-CM

## 2016-03-10 DIAGNOSIS — I10 Essential (primary) hypertension: Secondary | ICD-10-CM | POA: Diagnosis not present

## 2016-03-10 DIAGNOSIS — Z1159 Encounter for screening for other viral diseases: Secondary | ICD-10-CM | POA: Diagnosis not present

## 2016-03-10 DIAGNOSIS — E119 Type 2 diabetes mellitus without complications: Secondary | ICD-10-CM

## 2016-03-10 DIAGNOSIS — E784 Other hyperlipidemia: Secondary | ICD-10-CM | POA: Diagnosis not present

## 2016-03-10 DIAGNOSIS — M25562 Pain in left knee: Secondary | ICD-10-CM

## 2016-03-10 DIAGNOSIS — E7849 Other hyperlipidemia: Secondary | ICD-10-CM

## 2016-03-10 DIAGNOSIS — H268 Other specified cataract: Secondary | ICD-10-CM

## 2016-03-10 LAB — POCT GLYCOSYLATED HEMOGLOBIN (HGB A1C): Hemoglobin A1C: 7.4

## 2016-03-10 MED ORDER — LORATADINE 10 MG PO TABS
10.0000 mg | ORAL_TABLET | Freq: Every day | ORAL | 11 refills | Status: DC
Start: 2016-03-10 — End: 2017-03-18

## 2016-03-10 NOTE — Progress Notes (Signed)
Carl Dominguez  MRN: [id number] DOB: [birth year]  Subjective:  HPI  Patient is here for 4 months follow up He is checking his sugars and readings have been 160s, 175, 180 and close to 200s. This morning it was 148. No numbness or tingling sensation. Lab Results  Component Value Date   HGBA1C 7.0 11/05/2015   Patient does not check his b/p. He had this checked yesterday though at pre op for up coming cataract surgery on 1/17 and reading was around 116/66. BP Readings from Last 3 Encounters:  03/10/16 (!) 102/58  11/05/15 (!) 110/58  07/15/15 122/62    Patient Active Problem List   Diagnosis Date Noted  . Actinic keratosis 11/14/2014  . CAD in native artery 11/14/2014  . Cognitive complaints 11/14/2014  . Diabetes ([place]) 11/14/2014  . Essential (primary) hypertension 11/14/2014  . Gastro-esophageal reflux disease without esophagitis 11/14/2014  . Hemorrhage of gastrointestinal tract 11/14/2014  . Internal hemorrhoids 11/14/2014  . Personal history of tobacco use, presenting hazards to health 11/14/2014  . HLD (hyperlipidemia) 11/14/2014  . Allergic state 11/14/2014  . Arthritis of hand, degenerative 11/14/2014  . Allergic rhinitis, seasonal 11/14/2014  . Prostatism 11/14/2014  . Hypercholesterolemia without hypertriglyceridemia 11/14/2014  . Change in blood platelet count 11/14/2014  . Diabetes mellitus, type 2 ([place]) 11/14/2014  . Avitaminosis D 11/14/2014    Past Medical History:  Diagnosis Date  . Diabetes mellitus without complication ([place])   . Heart disease   . Hypertension   . Myocardial infarction 1999    Social History   Social History  . Marital status: Married    Spouse name: N/A  . Number of children: N/A  . Years of education: N/A   Occupational History  . Not on file.   Social History Main Topics  . Smoking status: Former Research scientist (life sciences)  . Smokeless tobacco: Never Used     Comment: has been quit for 30 years  . Alcohol use Yes     Comment:  occasionally  . Drug use: No  . Sexual activity: Not on file   Other Topics Concern  . Not on file   Social History Narrative  . No narrative on file    Outpatient Encounter Prescriptions as of 03/10/2016  Medication Sig Note  . aspirin 81 MG tablet Take by mouth. 11/14/2014: Received from: [facility]  . atorvastatin (LIPITOR) 40 MG tablet Take 1 tablet (40 mg total) by mouth at bedtime.   . cholecalciferol (VITAMIN D) 1000 UNITS tablet Take by mouth. 11/14/2014: Received from: [facility]  . CHOLESTYRAMINE PO Take by mouth. 11/14/2014: Received from: [facility]  . famotidine (PEPCID) 20 MG tablet Take by mouth. 11/14/2014: Received from: [facility]  . fluticasone (FLONASE) 50 MCG/ACT nasal spray Place into the nose. 11/14/2014: Takes PRN  . glipiZIDE (GLUCOTROL) 10 MG tablet TAKE 1 TABLET DAILY   . ibuprofen (ADVIL,MOTRIN) 200 MG tablet Take by mouth. 11/14/2014: Received from: [facility]  . latanoprost (XALATAN) 0.005 % ophthalmic solution Apply to eye. 11/14/2014: Received from: [facility]  . losartan (COZAAR) 50 MG tablet take 1 tablet by mouth daily   . metoprolol succinate (TOPROL-XL) 25 MG 24 hr tablet TAKE 1 TABLET DAILY   . naproxen (NAPROSYN) 500 MG tablet Take 1 tablet (500 mg total) by mouth 2 (two) times daily with a meal.   . pioglitazone (ACTOS) 45 MG tablet Take 1 tablet (45 mg total) by mouth daily.   Marland Kitchen  psyllium (METAMUCIL SMOOTH TEXTURE) 28 % packet Take by mouth. 11/14/2014: Received from: [facility]   No facility-administered encounter medications on file as of 03/10/2016.     No Known Allergies  Review of Systems  Constitutional: Negative.   Respiratory: Negative.   Cardiovascular: Negative.   Gastrointestinal: Negative.   Musculoskeletal: Positive for joint pain (knee).  Neurological: Negative.   Endo/Heme/Allergies: Negative.     Psychiatric/Behavioral: Negative.     Objective:  BP (!) 102/58   Pulse 80   Temp 97.9 F (36.6 C)   Resp 16   Wt 175 lb (79.4 kg)   BMI 26.61 kg/m   Physical Exam  Constitutional: He is oriented to person, place, and time and well-developed, well-nourished, and in no distress.  HENT:  Head: Normocephalic and atraumatic.  Right Ear: External ear normal.  Left Ear: External ear normal.  Nose: Nose normal.  Eyes: Conjunctivae are normal. No scleral icterus.  Neck: No thyromegaly present.  Cardiovascular: Normal rate, regular rhythm and normal heart sounds.   Pulmonary/Chest: Effort normal and breath sounds normal.  Abdominal: Soft.  Neurological: He is alert and oriented to person, place, and time.  Skin: Skin is warm and dry.  Psychiatric: Mood, memory, affect and judgment normal.    Assessment and Plan :  1. Type 2 diabetes mellitus without complication, without long-term current use of insulin (HCC) A1C 7.4. Worse. Will let patient work on habits and re check on the next visit. - POCT HgB A1C--7.4 today. - TSH  2. Essential (primary) hypertension Stable. - CBC w/Diff/Platelet - Comprehensive metabolic panel - TSH  3. Other hyperlipidemia Stable. - Comprehensive metabolic panel - Lipid Panel With LDL/HDL Ratio  4. Chronic pain of left knee Uses Naproxen as needed.  5. Other cataract, unspecified laterality Has surgery coming up on 1/17  6. Need for hepatitis C screening test - Hepatitis C Antibody  HPI, Exam and A&P transcribed under direction and in the presence of Miguel Aschoff, MD. I have done the exam and reviewed the chart and it is accurate to the best of my knowledge. Development worker, community has been used and  any errors in dictation or transcription are unintentional. Miguel Aschoff M.D. [facility]

## 2016-03-11 LAB — CBC WITH DIFFERENTIAL/PLATELET
Basophils Absolute: 0 10*3/uL (ref 0.0–0.2)
Basos: 0 %
EOS (ABSOLUTE): 0.3 10*3/uL (ref 0.0–0.4)
Eos: 3 %
Hematocrit: 45.4 % (ref 37.5–51.0)
Hemoglobin: 14.5 g/dL (ref 13.0–17.7)
Immature Grans (Abs): 0 10*3/uL (ref 0.0–0.1)
Immature Granulocytes: 0 %
Lymphocytes Absolute: 2.2 10*3/uL (ref 0.7–3.1)
Lymphs: 24 %
MCH: 30.9 pg (ref 26.6–33.0)
MCHC: 31.9 g/dL (ref 31.5–35.7)
MCV: 97 fL (ref 79–97)
Monocytes Absolute: 0.7 10*3/uL (ref 0.1–0.9)
Monocytes: 8 %
Neutrophils Absolute: 5.8 10*3/uL (ref 1.4–7.0)
Neutrophils: 65 %
Platelets: 151 10*3/uL (ref 150–379)
RBC: 4.69 x10E6/uL (ref 4.14–5.80)
RDW: 14.3 % (ref 12.3–15.4)
WBC: 9 10*3/uL (ref 3.4–10.8)

## 2016-03-11 LAB — COMPREHENSIVE METABOLIC PANEL
ALT: 18 IU/L (ref 0–44)
AST: 20 IU/L (ref 0–40)
Albumin/Globulin Ratio: 2 (ref 1.2–2.2)
Albumin: 4.2 g/dL (ref 3.5–4.8)
Alkaline Phosphatase: 59 IU/L (ref 39–117)
BUN/Creatinine Ratio: 17 (ref 10–24)
BUN: 20 mg/dL (ref 8–27)
Bilirubin Total: 1.8 mg/dL — ABNORMAL HIGH (ref 0.0–1.2)
CO2: 25 mmol/L (ref 18–29)
Calcium: 9.3 mg/dL (ref 8.6–10.2)
Chloride: 103 mmol/L (ref 96–106)
Creatinine, Ser: 1.21 mg/dL (ref 0.76–1.27)
GFR calc Af Amer: 69 mL/min/{1.73_m2} (ref >59)
GFR calc non Af Amer: 59 mL/min/{1.73_m2} — ABNORMAL LOW (ref >59)
Globulin, Total: 2.1 g/dL (ref 1.5–4.5)
Glucose: 182 mg/dL — ABNORMAL HIGH (ref 65–99)
Potassium: 4.5 mmol/L (ref 3.5–5.2)
Sodium: 143 mmol/L (ref 134–144)
Total Protein: 6.3 g/dL (ref 6.0–8.5)

## 2016-03-11 LAB — LIPID PANEL WITH LDL/HDL RATIO
Cholesterol, Total: 122 mg/dL (ref 100–199)
HDL: 41 mg/dL (ref >39)
LDL Calculated: 53 mg/dL (ref 0–99)
LDl/HDL Ratio: 1.3 ratio units (ref 0.0–3.6)
Triglycerides: 140 mg/dL (ref 0–149)
VLDL Cholesterol Cal: 28 mg/dL (ref 5–40)

## 2016-03-11 LAB — HEPATITIS C ANTIBODY: Hep C Virus Ab: <0.1 s/co ratio (ref 0.0–0.9)

## 2016-03-11 LAB — TSH: TSH: 2.82 u[IU]/mL (ref 0.450–4.500)

## 2016-03-16 NOTE — Discharge Instructions (Signed)
Cataract Surgery, Care After °Refer to this sheet in the next few weeks. These instructions provide you with information about caring for yourself after your procedure. Your health care provider may also give you more specific instructions. Your treatment has been planned according to current medical practices, but problems sometimes occur. Call your health care provider if you have any problems or questions after your procedure. °What can I expect after the procedure? °After the procedure, it is common to have: °· Itching. °· Discomfort. °· Fluid discharge. °· Sensitivity to light and to touch. °· Bruising. °Follow these instructions at home: °Eye Care  °· Check your eye every day for signs of infection. Watch for: °¨ Redness, swelling, or pain. °¨ Fluid, blood, or pus. °¨ Warmth. °¨ Bad smell. °Activity  °· Avoid strenuous activities, such as playing contact sports, for as long as told by your health care provider. °· Do not drive or operate heavy machinery until your health care provider approves. °· Do not bend or lift heavy objects . Bending increases pressure in the eye. You can walk, climb stairs, and do light household chores. °· Ask your health care provider when you can return to work. If you work in a dusty environment, you may be advised to wear protective eyewear for a period of time. °General instructions  °· Take or apply over-the-counter and prescription medicines only as told by your health care provider. This includes eye drops. °· Do not touch or rub your eyes. °· If you were given a protective shield, wear it as told by your health care provider. If you were not given a protective shield, wear sunglasses as told by your health care provider to protect your eyes. °· Keep the area around your eye clean and dry. Avoid swimming or allowing water to hit you directly in the face while showering until told by your health care provider. Keep soap and shampoo out of your eyes. °· Do not put a contact lens  into the affected eye or eyes until your health care provider approves. °· Keep all follow-up visits as told by your health care provider. This is important. °Contact a health care provider if: ° °· You have increased bruising around your eye. °· You have pain that is not helped with medicine. °· You have a fever. °· You have redness, swelling, or pain in your eye. °· You have fluid, blood, or pus coming from your incision. °· Your vision gets worse. °Get help right away if: °· You have sudden vision loss. °This information is not intended to replace advice given to you by your health care provider. Make sure you discuss any questions you have with your health care provider. °Document Released: 09/04/2004 Document Revised: 06/26/2015 Document Reviewed: 12/26/2014 °Elsevier Interactive Patient Education © 2017 [employer] ° ° ° ° °General Anesthesia, Adult, Care After °These instructions provide you with information about caring for yourself after your procedure. Your health care provider may also give you more specific instructions. Your treatment has been planned according to current medical practices, but problems sometimes occur. Call your health care provider if you have any problems or questions after your procedure. °What can I expect after the procedure? °After the procedure, it is common to have: °· Vomiting. °· A sore throat. °· Mental slowness. °It is common to feel: °· Nauseous. °· Cold or shivery. °· Sleepy. °· Tired. °· Sore or achy, even in parts of your body where you did not have surgery. °Follow these instructions at   home: °For at least 24 hours after the procedure:  °· Do not: °¨ Participate in activities where you could fall or become injured. °¨ Drive. °¨ Use heavy machinery. °¨ Drink alcohol. °¨ Take sleeping pills or medicines that cause drowsiness. °¨ Make important decisions or sign legal documents. °¨ Take care of children on your own. °· Rest. °Eating and drinking  °· If you vomit, drink  water, juice, or soup when you can drink without vomiting. °· Drink enough fluid to keep your urine clear or pale yellow. °· Make sure you have little or no nausea before eating solid foods. °· Follow the diet recommended by your health care provider. °General instructions  °· Have a responsible adult stay with you until you are awake and alert. °· Return to your normal activities as told by your health care provider. Ask your health care provider what activities are safe for you. °· Take over-the-counter and prescription medicines only as told by your health care provider. °· If you smoke, do not smoke without supervision. °· Keep all follow-up visits as told by your health care provider. This is important. °Contact a health care provider if: °· You continue to have nausea or vomiting at home, and medicines are not helpful. °· You cannot drink fluids or start eating again. °· You cannot urinate after 8-12 hours. °· You develop a skin rash. °· You have fever. °· You have increasing redness at the site of your procedure. °Get help right away if: °· You have difficulty breathing. °· You have chest pain. °· You have unexpected bleeding. °· You feel that you are having a life-threatening or urgent problem. °This information is not intended to replace advice given to you by your health care provider. Make sure you discuss any questions you have with your health care provider. °Document Released: 05/24/2000 Document Revised: 07/21/2015 Document Reviewed: 01/30/2015 °Elsevier Interactive Patient Education © 2017 Elsevier Inc. ° °

## 2016-03-24 ENCOUNTER — Ambulatory Visit: Payer: Medicare Other | Admitting: Anesthesiology

## 2016-03-24 ENCOUNTER — Encounter
Admission: RE | Disposition: A | Discharge status: Home / Self Care | Payer: Self-pay | Source: Ambulatory Visit | Attending: Ophthalmology

## 2016-03-24 ENCOUNTER — Ambulatory Visit
Admission: RE | Admit: 2016-03-24 | Discharge: 2016-03-24 | Disposition: A | Discharge status: Home / Self Care | Payer: Medicare Other | Source: Ambulatory Visit | Attending: Ophthalmology | Admitting: Ophthalmology

## 2016-03-24 DIAGNOSIS — Z87891 Personal history of nicotine dependence: Secondary | ICD-10-CM | POA: Insufficient documentation

## 2016-03-24 DIAGNOSIS — I252 Old myocardial infarction: Secondary | ICD-10-CM | POA: Insufficient documentation

## 2016-03-24 DIAGNOSIS — E1136 Type 2 diabetes mellitus with diabetic cataract: Secondary | ICD-10-CM | POA: Diagnosis not present

## 2016-03-24 DIAGNOSIS — H2511 Age-related nuclear cataract, right eye: Secondary | ICD-10-CM | POA: Diagnosis not present

## 2016-03-24 DIAGNOSIS — I2510 Atherosclerotic heart disease of native coronary artery without angina pectoris: Secondary | ICD-10-CM | POA: Diagnosis not present

## 2016-03-24 DIAGNOSIS — I10 Essential (primary) hypertension: Secondary | ICD-10-CM | POA: Insufficient documentation

## 2016-03-24 DIAGNOSIS — H2513 Age-related nuclear cataract, bilateral: Secondary | ICD-10-CM | POA: Diagnosis not present

## 2016-03-24 DIAGNOSIS — Z79899 Other long term (current) drug therapy: Secondary | ICD-10-CM | POA: Insufficient documentation

## 2016-03-24 HISTORY — PX: CATARACT EXTRACTION W/PHACO: SHX586

## 2016-03-24 LAB — GLUCOSE, CAPILLARY
Glucose-Capillary: 218 mg/dL — ABNORMAL HIGH (ref 65–99)
Glucose-Capillary: 232 mg/dL — ABNORMAL HIGH (ref 65–99)
Glucose-Capillary: 209 mg/dL — ABNORMAL HIGH (ref 65–99)

## 2016-03-24 SURGERY — PHACOEMULSIFICATION, CATARACT, WITH IOL INSERTION
Anesthesia: Monitor Anesthesia Care | Site: Eye | Laterality: Right | Wound class: Clean

## 2016-03-24 MED ORDER — LACTATED RINGERS IV SOLN: INTRAVENOUS | Status: DC

## 2016-03-24 MED ORDER — BSS IO SOLN
INTRAOCULAR | Status: DC | PRN
  Administered 2016-03-24: 65 mL via OPHTHALMIC

## 2016-03-24 MED ORDER — NA HYALUR & NA CHOND-NA HYALUR 0.4-0.35 ML IO KIT
PACK | INTRAOCULAR | Status: DC | PRN
  Administered 2016-03-24: 1 mL via INTRAOCULAR

## 2016-03-24 MED ORDER — MOXIFLOXACIN HCL 0.5 % OP SOLN
1.0000 [drp] | OPHTHALMIC | Status: DC | PRN
  Administered 2016-03-24 (×3): 1 [drp] via OPHTHALMIC

## 2016-03-24 MED ORDER — FENTANYL CITRATE (PF) 100 MCG/2ML IJ SOLN
INTRAMUSCULAR | Status: DC | PRN
  Administered 2016-03-24: 50 ug via INTRAVENOUS

## 2016-03-24 MED ORDER — LIDOCAINE HCL (PF) 4 % IJ SOLN
INTRAOCULAR | Status: DC | PRN
  Administered 2016-03-24: 2 mL via OPHTHALMIC

## 2016-03-24 MED ORDER — MIDAZOLAM HCL 2 MG/2ML IJ SOLN
INTRAMUSCULAR | Status: DC | PRN
  Administered 2016-03-24: 2 mg via INTRAVENOUS

## 2016-03-24 MED ORDER — CEFUROXIME OPHTHALMIC INJECTION 1 MG/0.1 ML
INJECTION | OPHTHALMIC | Status: DC | PRN
  Administered 2016-03-24: .3 mL via INTRACAMERAL

## 2016-03-24 MED ORDER — ARMC OPHTHALMIC DILATING DROPS
1.0000 "application " | OPHTHALMIC | Status: DC | PRN
  Administered 2016-03-24 (×2): 1 via OPHTHALMIC

## 2016-03-24 MED ORDER — BRIMONIDINE TARTRATE-TIMOLOL 0.2-0.5 % OP SOLN
OPHTHALMIC | Status: DC | PRN
  Administered 2016-03-24: 1 [drp] via OPHTHALMIC

## 2016-03-24 SURGICAL SUPPLY — 26 items
CANNULA ANT/CHMB 27GA (MISCELLANEOUS) ×3 IMPLANT
CARTRIDGE ABBOTT (MISCELLANEOUS) IMPLANT
GLOVE SURG LX 7.5 STRW (GLOVE) ×2
GLOVE SURG LX STRL 7.5 STRW (GLOVE) ×1 IMPLANT
GLOVE SURG TRIUMPH 8.0 PF LTX (GLOVE) ×3 IMPLANT
GOWN STRL REUS W/ TWL LRG LVL3 (GOWN DISPOSABLE) ×2 IMPLANT
GOWN STRL REUS W/TWL LRG LVL3 (GOWN DISPOSABLE) ×4
LENS IOL ACRSF IQ ULTRA 18.5 (Intraocular Lens) ×1 IMPLANT
LENS IOL ACRYSOF IQ 18.5 (Intraocular Lens) ×3 IMPLANT
MARKER SKIN DUAL TIP RULER LAB (MISCELLANEOUS) ×3 IMPLANT
NDL RETROBULBAR .5 NSTRL (NEEDLE) IMPLANT
NEEDLE FILTER BLUNT 18X 1/2SAF (NEEDLE) ×2
NEEDLE FILTER BLUNT 18X1 1/2 (NEEDLE) ×1 IMPLANT
PACK CATARACT BRASINGTON (MISCELLANEOUS) ×3 IMPLANT
PACK EYE AFTER SURG (MISCELLANEOUS) ×3 IMPLANT
PACK OPTHALMIC (MISCELLANEOUS) ×3 IMPLANT
RING MALYGIN 7.0 (MISCELLANEOUS) IMPLANT
SUT ETHILON 10-0 CS-B-6CS-B-6 (SUTURE)
SUT VICRYL  9 0 (SUTURE)
SUT VICRYL 9 0 (SUTURE) IMPLANT
SUTURE EHLN 10-0 CS-B-6CS-B-6 (SUTURE) IMPLANT
SYR 3ML LL SCALE MARK (SYRINGE) ×3 IMPLANT
SYR 5ML LL (SYRINGE) ×3 IMPLANT
SYR TB 1ML LUER SLIP (SYRINGE) ×3 IMPLANT
WATER STERILE IRR 250ML POUR (IV SOLUTION) ×3 IMPLANT
WIPE NON LINTING 3.25X3.25 (MISCELLANEOUS) ×3 IMPLANT

## 2016-03-24 NOTE — Anesthesia Procedure Notes (Signed)
Procedure Name: MAC Performed by: Keishon Chavarin Pre-anesthesia Checklist: Patient identified, Emergency Drugs available, Suction available, Timeout performed and Patient being monitored Patient Re-evaluated:Patient Re-evaluated prior to inductionOxygen Delivery Method: Nasal cannula Placement Confirmation: positive ETCO2     

## 2016-03-24 NOTE — Transfer of Care (Signed)
Immediate Anesthesia Transfer of Care Note  Patient: Carl Dominguez  Procedure(s) Performed: Procedure(s) with comments: CATARACT EXTRACTION PHACO AND INTRAOCULAR LENS PLACEMENT (IOC)  Right (Right) - Diabetic - oral meds right eye  Patient Location: PACU  Anesthesia Type: MAC  Level of Consciousness: awake, alert  and patient cooperative  Airway and Oxygen Therapy: Patient Spontanous Breathing and Patient connected to supplemental oxygen  Post-op Assessment: Post-op Vital signs reviewed, Patient's Cardiovascular Status Stable, Respiratory Function Stable, Patent Airway and No signs of Nausea or vomiting  Post-op Vital Signs: Reviewed and stable  Complications: No apparent anesthesia complications

## 2016-03-24 NOTE — Anesthesia Preprocedure Evaluation (Addendum)
Anesthesia Evaluation  Patient identified by MRN, date of birth, ID band Patient awake    Reviewed: Allergy & Precautions, H&P , NPO status , Patient's Chart, lab work & pertinent test results  Airway Mallampati: I  TM Distance: >3 FB Neck ROM: full    Dental no notable dental hx.    Pulmonary former smoker,    Pulmonary exam normal        Cardiovascular hypertension, On Medications + CAD and + Past MI  Normal cardiovascular exam  H/o MI 1999 with two stents- no issues since that time.     Neuro/Psych    GI/Hepatic Neg liver ROS,   Endo/Other  diabetes, Well Controlled, Type 2  Renal/GU      Musculoskeletal   Abdominal   Peds  Hematology negative hematology ROS (+)   Anesthesia Other Findings   Reproductive/Obstetrics negative OB ROS                            Anesthesia Physical Anesthesia Plan  ASA: II  Anesthesia Plan: MAC   Post-op Pain Management:    Induction:   Airway Management Planned:   Additional Equipment:   Intra-op Plan:   Post-operative Plan:   Informed Consent: I have reviewed the patients History and Physical, chart, labs and discussed the procedure including the risks, benefits and alternatives for the proposed anesthesia with the patient or authorized representative who has indicated his/her understanding and acceptance.     Plan Discussed with:   Anesthesia Plan Comments:         Anesthesia Quick Evaluation

## 2016-03-24 NOTE — Anesthesia Postprocedure Evaluation (Signed)
Anesthesia Post Note  Patient: Carl Dominguez  Procedure(s) Performed: Procedure(s) (LRB): CATARACT EXTRACTION PHACO AND INTRAOCULAR LENS PLACEMENT (IOC)  Right (Right)  Patient location during evaluation: PACU Anesthesia Type: MAC Level of consciousness: awake Pain management: pain level controlled Vital Signs Assessment: post-procedure vital signs reviewed and stable Respiratory status: spontaneous breathing Cardiovascular status: blood pressure returned to baseline Postop Assessment: no headache Anesthetic complications: no    Jaci Standard, III,  Analysse Quinonez D

## 2016-03-24 NOTE — Op Note (Signed)
LOCATION:  [place]   PREOPERATIVE DIAGNOSIS:    Nuclear sclerotic cataract right eye. H25.11   POSTOPERATIVE DIAGNOSIS:  Nuclear sclerotic cataract right eye.     PROCEDURE:  Phacoemusification with posterior chamber intraocular lens placement of the right eye   LENS:   Implant Name Type Inv. Item Serial No. Manufacturer Lot No. LRB No. Used  LENS IOL ACRYSOF IQ 18.5 - [id number] Intraocular Lens LENS IOL ACRYSOF IQ 18.5 [id number] [employer]CON   Right 1        ULTRASOUND TIME: 15 % of 1 minutes, 10 seconds.  CDE 10.8   SURGEON:  Wyonia Hough, MD   ANESTHESIA:  Topical with tetracaine drops and 2% Xylocaine jelly, augmented with 1% preservative-free intracameral lidocaine.    COMPLICATIONS:  None.   DESCRIPTION OF PROCEDURE:  The patient was identified in the holding room and transported to the operating room and placed in the supine position under the operating microscope.  The right eye was identified as the operative eye and it was prepped and draped in the usual sterile ophthalmic fashion.   A 1 millimeter clear-corneal paracentesis was made at the 12:00 position.  0.5 ml of preservative-free 1% lidocaine was injected into the anterior chamber. The anterior chamber was filled with Viscoat viscoelastic.  A 2.4 millimeter keratome was used to make a near-clear corneal incision at the 9:00 position.  A curvilinear capsulorrhexis was made with a cystotome and capsulorrhexis forceps.  Balanced salt solution was used to hydrodissect and hydrodelineate the nucleus.   Phacoemulsification was then used in stop and chop fashion to remove the lens nucleus and epinucleus.  The remaining cortex was then removed using the irrigation and aspiration handpiece. Provisc was then placed into the capsular bag to distend it for lens placement.  A lens was then injected into the capsular bag.  The remaining viscoelastic was aspirated.   Wounds were hydrated with balanced salt solution.   The anterior chamber was inflated to a physiologic pressure with balanced salt solution.  No wound leaks were noted. Cefuroxime 0.1 ml of a 10mg /ml solution was injected into the anterior chamber for a dose of 1 mg of intracameral antibiotic at the completion of the case.   Timolol and Brimonidine drops were applied to the eye.  The patient was taken to the recovery room in stable condition without complications of anesthesia or surgery.   Carl Dominguez 03/24/2016, 8:38 AM

## 2016-03-24 NOTE — H&P (Signed)
The History and Physical notes are on paper, have been signed, and are to be scanned. The patient remains stable and unchanged from the H&P.   Previous H&P reviewed, patient examined, and there are no changes.  Solae Norling 03/24/2016 7:41 AM

## 2016-03-25 ENCOUNTER — Encounter: Payer: Self-pay | Admitting: Ophthalmology

## 2016-03-30 DIAGNOSIS — R197 Diarrhea, unspecified: Secondary | ICD-10-CM | POA: Diagnosis not present

## 2016-03-30 DIAGNOSIS — E561 Deficiency of vitamin K: Secondary | ICD-10-CM | POA: Diagnosis not present

## 2016-04-05 DIAGNOSIS — E785 Hyperlipidemia, unspecified: Secondary | ICD-10-CM | POA: Diagnosis not present

## 2016-04-05 DIAGNOSIS — E119 Type 2 diabetes mellitus without complications: Secondary | ICD-10-CM | POA: Diagnosis not present

## 2016-04-05 DIAGNOSIS — I209 Angina pectoris, unspecified: Secondary | ICD-10-CM | POA: Diagnosis not present

## 2016-04-05 DIAGNOSIS — E669 Obesity, unspecified: Secondary | ICD-10-CM | POA: Diagnosis not present

## 2016-04-05 DIAGNOSIS — R0602 Shortness of breath: Secondary | ICD-10-CM | POA: Diagnosis not present

## 2016-04-05 DIAGNOSIS — I2510 Atherosclerotic heart disease of native coronary artery without angina pectoris: Secondary | ICD-10-CM | POA: Diagnosis not present

## 2016-04-05 DIAGNOSIS — I208 Other forms of angina pectoris: Secondary | ICD-10-CM | POA: Diagnosis not present

## 2016-04-05 DIAGNOSIS — I10 Essential (primary) hypertension: Secondary | ICD-10-CM | POA: Diagnosis not present

## 2016-04-05 DIAGNOSIS — R011 Cardiac murmur, unspecified: Secondary | ICD-10-CM | POA: Diagnosis not present

## 2016-04-05 DIAGNOSIS — K219 Gastro-esophageal reflux disease without esophagitis: Secondary | ICD-10-CM | POA: Diagnosis not present

## 2016-04-05 DIAGNOSIS — R001 Bradycardia, unspecified: Secondary | ICD-10-CM | POA: Diagnosis not present

## 2016-04-27 ENCOUNTER — Other Ambulatory Visit: Payer: Self-pay

## 2016-04-27 MED ORDER — PIOGLITAZONE HCL 45 MG PO TABS: 45.0000 mg | ORAL_TABLET | Freq: Every day | ORAL | 3 refills | Status: DC

## 2016-05-12 ENCOUNTER — Other Ambulatory Visit: Payer: Self-pay

## 2016-05-12 MED ORDER — GLUCOSE BLOOD VI STRP: ORAL_STRIP | 3 refills | Status: DC

## 2016-06-28 DIAGNOSIS — H401131 Primary open-angle glaucoma, bilateral, mild stage: Secondary | ICD-10-CM | POA: Diagnosis not present

## 2016-07-06 DIAGNOSIS — H401131 Primary open-angle glaucoma, bilateral, mild stage: Secondary | ICD-10-CM | POA: Diagnosis not present

## 2016-07-06 LAB — HM DIABETES EYE EXAM

## 2016-07-08 ENCOUNTER — Telehealth: Payer: Self-pay

## 2016-07-08 ENCOUNTER — Ambulatory Visit (INDEPENDENT_AMBULATORY_CARE_PROVIDER_SITE_OTHER): Payer: Medicare Other | Admitting: Family Medicine

## 2016-07-08 ENCOUNTER — Ambulatory Visit
Admission: RE | Admit: 2016-07-08 | Discharge: 2016-07-08 | Disposition: A | Discharge status: Home / Self Care | Payer: Medicare Other | Source: Ambulatory Visit | Attending: Family Medicine | Admitting: Family Medicine

## 2016-07-08 ENCOUNTER — Encounter: Payer: Self-pay | Admitting: Family Medicine

## 2016-07-08 VITALS — BP 112/68 | HR 68 | Temp 97.7°F | Resp 16 | Wt 176.0 lb

## 2016-07-08 DIAGNOSIS — J301 Allergic rhinitis due to pollen: Secondary | ICD-10-CM

## 2016-07-08 DIAGNOSIS — M50321 Other cervical disc degeneration at C4-C5 level: Secondary | ICD-10-CM | POA: Diagnosis not present

## 2016-07-08 DIAGNOSIS — M542 Cervicalgia: Secondary | ICD-10-CM | POA: Diagnosis not present

## 2016-07-08 DIAGNOSIS — E119 Type 2 diabetes mellitus without complications: Secondary | ICD-10-CM

## 2016-07-08 DIAGNOSIS — M4312 Spondylolisthesis, cervical region: Secondary | ICD-10-CM | POA: Diagnosis not present

## 2016-07-08 DIAGNOSIS — M5030 Other cervical disc degeneration, unspecified cervical region: Secondary | ICD-10-CM | POA: Insufficient documentation

## 2016-07-08 DIAGNOSIS — M47812 Spondylosis without myelopathy or radiculopathy, cervical region: Secondary | ICD-10-CM | POA: Insufficient documentation

## 2016-07-08 DIAGNOSIS — I700 Atherosclerosis of aorta: Secondary | ICD-10-CM | POA: Insufficient documentation

## 2016-07-08 LAB — POCT GLYCOSYLATED HEMOGLOBIN (HGB A1C): Hemoglobin A1C: 7.7

## 2016-07-08 MED ORDER — MONTELUKAST SODIUM 10 MG PO TABS: 10.0000 mg | ORAL_TABLET | Freq: Every day | ORAL | 11 refills | Status: DC

## 2016-07-08 NOTE — Telephone Encounter (Signed)
[id number] 

## 2016-07-08 NOTE — Telephone Encounter (Signed)
-----   Message from Jerrol Banana., MD sent at 07/08/2016 10:17 AM EDT ----- Arthritis and DDD noted.

## 2016-07-08 NOTE — Progress Notes (Signed)
Subjective:  HPI  Diabetes Mellitus Type II, Follow-up:   Lab Results  Component Value Date   HGBA1C 7.4 03/10/2016   HGBA1C 7.0 11/05/2015   HGBA1C 8.0 07/15/2015    Last seen for diabetes 4 months ago.  Management since then includes none. He reports good compliance with treatment. He is not having side effects.  Home blood sugar records: 130-170  Episodes of hypoglycemia? no   Current Insulin Regimen: n/a Most Recent Eye Exam: 07/07/16 Current exercise: yard work  Pertinent Labs:    Component Value Date/Time   CHOL 122 03/10/2016 0844   TRIG 140 03/10/2016 0844   HDL 41 03/10/2016 0844   LDLCALC 53 03/10/2016 0844   CREATININE 1.21 03/10/2016 0844    Wt Readings from Last 3 Encounters:  07/08/16 176 lb (79.8 kg)  03/24/16 170 lb (77.1 kg)  03/10/16 175 lb (79.4 kg)    ------------------------------------------------------------------------    Prior to Admission medications   Medication Sig Start Date End Date Taking? Authorizing Provider  aspirin 81 MG tablet Take by mouth. 04/06/10   [provider]  atorvastatin (LIPITOR) 40 MG tablet Take 1 tablet (40 mg total) by mouth at bedtime. 08/25/15   Jerrol Banana., MD  cholecalciferol (VITAMIN D) 1000 UNITS tablet Take by mouth.    [provider]  CHOLESTYRAMINE PO Take by mouth. 07/06/12   [provider]  famotidine (PEPCID) 20 MG tablet Take by mouth.    [provider]  fluticasone (FLONASE) 50 MCG/ACT nasal spray Place into the nose. 07/31/12   [provider]  glipiZIDE (GLUCOTROL) 10 MG tablet TAKE 1 TABLET DAILY 11/12/15   Jerrol Banana., MD  glucose blood (FREESTYLE TEST STRIPS) test strip Check sugar once daily DX E11.9 05/12/16   Jerrol Banana., MD  ibuprofen (ADVIL,MOTRIN) 200 MG tablet Take by mouth.    [provider]  latanoprost (XALATAN) 0.005 % ophthalmic solution Apply to eye.    [provider]  loratadine  (CLARITIN) 10 MG tablet Take 1 tablet (10 mg total) by mouth daily. 03/10/16   Jerrol Banana., MD  losartan (COZAAR) 50 MG tablet take 1 tablet by mouth daily 07/01/15   Jerrol Banana., MD  metoprolol succinate (TOPROL-XL) 25 MG 24 hr tablet TAKE 1 TABLET DAILY 01/19/16   Mar Daring, PA-C  naproxen (NAPROSYN) 500 MG tablet Take 1 tablet (500 mg total) by mouth 2 (two) times daily with a meal. Patient not taking: Reported on 03/24/2016 07/09/15   Jerrol Banana., MD  pioglitazone (ACTOS) 45 MG tablet Take 1 tablet (45 mg total) by mouth daily. 04/27/16   Jerrol Banana., MD  psyllium (METAMUCIL SMOOTH TEXTURE) 28 % packet Take by mouth. 11/06/13   [provider]    Patient Active Problem List   Diagnosis Date Noted  . Actinic keratosis 11/14/2014  . CAD in native artery 11/14/2014  . Cognitive complaints 11/14/2014  . Diabetes ([place]) 11/14/2014  . Essential (primary) hypertension 11/14/2014  . Gastro-esophageal reflux disease without esophagitis 11/14/2014  . Hemorrhage of gastrointestinal tract 11/14/2014  . Internal hemorrhoids 11/14/2014  . Personal history of tobacco use, presenting hazards to health 11/14/2014  . HLD (hyperlipidemia) 11/14/2014  . Allergic state 11/14/2014  . Arthritis of hand, degenerative 11/14/2014  . Allergic rhinitis, seasonal 11/14/2014  . Prostatism 11/14/2014  . Hypercholesterolemia without hypertriglyceridemia 11/14/2014  . Change in blood platelet count 11/14/2014  . Diabetes  mellitus, type 2 ([place]) 11/14/2014  . Avitaminosis D 11/14/2014    Past Medical History:  Diagnosis Date  . Diabetes mellitus without complication ([place])   . Heart disease   . Hypertension   . Myocardial infarction [facility]) 1999    Social History   Social History  . Marital status: Married    Spouse name: N/A  . Number of children: N/A  . Years of education: N/A   Occupational History  . Not on file.   Social History Main Topics    . Smoking status: Former Smoker    Quit date: 03/01/1982  . Smokeless tobacco: Never Used     Comment: has been quit for 30 years  . Alcohol use Yes     Comment: occasionally  . Drug use: No  . Sexual activity: Not on file   Other Topics Concern  . Not on file   Social History Narrative  . No narrative on file    No Known Allergies  Review of Systems  Constitutional: Negative.   HENT: Positive for congestion (runny nose).   Eyes: Positive for discharge and redness.  Respiratory: Negative.   Cardiovascular: Negative.   Gastrointestinal: Negative.   Genitourinary: Negative.   Musculoskeletal: Negative.   Skin: Negative.   Neurological: Negative.   Endo/Heme/Allergies: Negative.   Psychiatric/Behavioral: Negative.     Immunization History  Administered Date(s) Administered  . Influenza, High Dose Seasonal PF 12/01/2015  . Influenza-Unspecified 11/30/2014  . Pneumococcal Conjugate-13 12/07/2012, 03/14/2014  . Pneumococcal Polysaccharide-23 02/03/2005, 12/13/2008  . Tdap 12/06/2007  . Zoster 12/31/2010    Objective:  BP 112/68 (BP Location: Left Arm, Patient Position: Sitting, Cuff Size: Normal)   Pulse 68   Temp 97.7 F (36.5 C) (Oral)   Resp 16   Wt 176 lb (79.8 kg)   BMI 27.57 kg/m   Physical Exam  Constitutional: He is oriented to person, place, and time and well-developed, well-nourished, and in no distress.  Eyes: Conjunctivae and EOM are normal. Pupils are equal, round, and reactive to light.  Neck: Normal range of motion. Neck supple.  Cardiovascular: Normal rate, regular rhythm, normal heart sounds and intact distal pulses.   Pulmonary/Chest: Effort normal and breath sounds normal.  Musculoskeletal: Normal range of motion.  Neurological: He is alert and oriented to person, place, and time. He has normal reflexes. Gait normal. GCS score is 15.  Skin: Skin is warm and dry.  Psychiatric: Mood, memory, affect and judgment normal.    Lab Results   Component Value Date   WBC 9.0 03/10/2016   HCT 45.4 03/10/2016   PLT 151 03/10/2016   GLUCOSE 182 (H) 03/10/2016   CHOL 122 03/10/2016   TRIG 140 03/10/2016   HDL 41 03/10/2016   LDLCALC 53 03/10/2016   TSH 2.820 03/10/2016   HGBA1C 7.4 03/10/2016   MICROALBUR 20 07/15/2015    CMP     Component Value Date/Time   NA 143 03/10/2016 0844   K 4.5 03/10/2016 0844   CL 103 03/10/2016 0844   CO2 25 03/10/2016 0844   GLUCOSE 182 (H) 03/10/2016 0844   BUN 20 03/10/2016 0844   CREATININE 1.21 03/10/2016 0844   CALCIUM 9.3 03/10/2016 0844   PROT 6.3 03/10/2016 0844   ALBUMIN 4.2 03/10/2016 0844   AST 20 03/10/2016 0844   ALT 18 03/10/2016 0844   ALKPHOS 59 03/10/2016 0844   BILITOT 1.8 (H) 03/10/2016 0844   GFRNONAA 59 (L) 03/10/2016 0844   GFRAA 69 03/10/2016 0844  Assessment and Plan :  1. Type 2 diabetes mellitus without complication, without long-term current use of insulin (HCC)  - POCT HgB A1C 7.7 today. Worse but pt will work on habits  2. Seasonal allergic rhinitis due to pollen  - montelukast (SINGULAIR) 10 MG tablet; Take 1 tablet (10 mg total) by mouth at bedtime.  Dispense: 30 tablet; Refill: 11  3. Neck pain OA/DDD. - DG Cervical Spine 2 or 3 views; Future   HPI, Exam, and A&P Transcribed under the direction and in the presence of Richard L. Cranford Mon, MD  Electronically Signed: Katina Dung, [place] MD [facility] 07/08/2016 8:37 AM

## 2016-07-08 NOTE — Telephone Encounter (Signed)
Patient was advised he would like to know if there is anything he can do about treatment for arthritis or DDD? KW

## 2016-07-12 NOTE — Telephone Encounter (Signed)
Try Naproxen 500mg  BID prn for 2 weeks.

## 2016-07-12 NOTE — Telephone Encounter (Signed)
Patient advised, patient has some Naproxen left from last year and he will take that. Patient will let us know if pain is not improving or if he is having more issues-aa

## 2016-07-12 NOTE — Telephone Encounter (Signed)
lmtcb, per his chart naproxen is on in his med list but need to verify if he has been taking this already-aa

## 2016-07-17 ENCOUNTER — Other Ambulatory Visit: Payer: Self-pay | Admitting: Physician Assistant

## 2016-07-17 DIAGNOSIS — I10 Essential (primary) hypertension: Secondary | ICD-10-CM

## 2016-09-23 ENCOUNTER — Other Ambulatory Visit: Payer: Self-pay | Admitting: Family Medicine

## 2016-11-06 ENCOUNTER — Other Ambulatory Visit: Payer: Self-pay | Admitting: Family Medicine

## 2016-11-06 DIAGNOSIS — E119 Type 2 diabetes mellitus without complications: Secondary | ICD-10-CM

## 2016-11-15 ENCOUNTER — Ambulatory Visit (INDEPENDENT_AMBULATORY_CARE_PROVIDER_SITE_OTHER): Payer: Medicare Other | Admitting: Family Medicine

## 2016-11-15 VITALS — BP 138/64 | HR 76 | Temp 97.7°F | Resp 14 | Wt 179.0 lb

## 2016-11-15 DIAGNOSIS — J301 Allergic rhinitis due to pollen: Secondary | ICD-10-CM

## 2016-11-15 DIAGNOSIS — E119 Type 2 diabetes mellitus without complications: Secondary | ICD-10-CM | POA: Diagnosis not present

## 2016-11-15 DIAGNOSIS — Z2821 Immunization not carried out because of patient refusal: Secondary | ICD-10-CM | POA: Diagnosis not present

## 2016-11-15 DIAGNOSIS — Z6828 Body mass index (BMI) 28.0-28.9, adult: Secondary | ICD-10-CM

## 2016-11-15 DIAGNOSIS — E784 Other hyperlipidemia: Secondary | ICD-10-CM

## 2016-11-15 DIAGNOSIS — E7849 Other hyperlipidemia: Secondary | ICD-10-CM

## 2016-11-15 DIAGNOSIS — I10 Essential (primary) hypertension: Secondary | ICD-10-CM

## 2016-11-15 LAB — POCT UA - MICROALBUMIN: Microalbumin Ur, POC: NEGATIVE mg/L

## 2016-11-15 LAB — POCT GLYCOSYLATED HEMOGLOBIN (HGB A1C): Hemoglobin A1C: 7.1

## 2016-11-15 NOTE — Patient Instructions (Signed)
Check sugar when you start feeling faint again and write down the reading. May need to adjust your medication if these episodes become more frequent.

## 2016-11-15 NOTE — Progress Notes (Signed)
Carl Dominguez  MRN: [id number] DOB: 11/17/1945  Subjective:  HPI  Patient is here for 4 months follow up. Diabetes:. Patient is checking his sugar fasting, reading usually around 140-150. Sometimes after working in the yard for a while he will start getting a little faint, sugar drops, he does not check it at that time just goes and eats an energy bar and feels better. This does not happen often per patient. No numbness or tingling sensation present. Lab Results  Component Value Date   HGBA1C 7.7 07/08/2016   Wt Readings from Last 3 Encounters:  11/15/16 179 lb (81.2 kg)  07/08/16 176 lb (79.8 kg)  03/24/16 170 lb (77.1 kg)   Hypertension: patient does not check his b/p at home. No chest pain or tightness issue. BP Readings from Last 3 Encounters:  11/15/16 138/64  07/08/16 112/68  03/24/16 (!) 99/59   Last lab work was done on 03/10/16-routine-was all okay.  Patient Active Problem List   Diagnosis Date Noted  . Actinic keratosis 11/14/2014  . CAD in native artery 11/14/2014  . Cognitive complaints 11/14/2014  . Diabetes ([place]) 11/14/2014  . Essential (primary) hypertension 11/14/2014  . Gastro-esophageal reflux disease without esophagitis 11/14/2014  . Hemorrhage of gastrointestinal tract 11/14/2014  . Internal hemorrhoids 11/14/2014  . Personal history of tobacco use, presenting hazards to health 11/14/2014  . HLD (hyperlipidemia) 11/14/2014  . Allergic state 11/14/2014  . Arthritis of hand, degenerative 11/14/2014  . Allergic rhinitis, seasonal 11/14/2014  . Prostatism 11/14/2014  . Hypercholesterolemia without hypertriglyceridemia 11/14/2014  . Change in blood platelet count 11/14/2014  . Diabetes mellitus, type 2 ([place]) 11/14/2014  . Avitaminosis D 11/14/2014    Past Medical History:  Diagnosis Date  . Diabetes mellitus without complication ([place])   . Heart disease   . Hypertension   . Myocardial infarction [facility]) 1999    Social History   Social History    . Marital status: Married    Spouse name: N/A  . Number of children: N/A  . Years of education: N/A   Occupational History  . Not on file.   Social History Main Topics  . Smoking status: Former Smoker    Quit date: 03/01/1982  . Smokeless tobacco: Never Used     Comment: has been quit for 30 years  . Alcohol use Yes     Comment: occasionally  . Drug use: No  . Sexual activity: Not on file   Other Topics Concern  . Not on file   Social History Narrative  . No narrative on file    Outpatient Encounter Prescriptions as of 11/15/2016  Medication Sig Note  . aspirin 81 MG tablet Take by mouth. 11/14/2014: Received from: [facility]  . atorvastatin (LIPITOR) 40 MG tablet Take 1 tablet (40 mg total) by mouth at bedtime.   . cholecalciferol (VITAMIN D) 1000 UNITS tablet Take by mouth. 11/14/2014: Received from: [facility]  . CHOLESTYRAMINE PO Take by mouth. Takes once daily 11/14/2014: Received from: [facility]  . famotidine (PEPCID) 20 MG tablet Take by mouth. 11/14/2014: Received from: [facility]  . fluticasone (FLONASE) 50 MCG/ACT nasal spray Place into the nose. 11/14/2014: Takes PRN  . glipiZIDE (GLUCOTROL) 10 MG tablet TAKE 1 TABLET DAILY   . glucose blood (FREESTYLE TEST STRIPS) test strip Check sugar once daily DX E11.9   . ibuprofen (ADVIL,MOTRIN) 200 MG tablet Take by mouth. 11/14/2014: Received from: [facility]  . latanoprost (XALATAN) 0.005 %  ophthalmic solution Apply to eye. 11/14/2014: Received from: [facility]  . loratadine (CLARITIN) 10 MG tablet Take 1 tablet (10 mg total) by mouth daily.   Marland Kitchen losartan (COZAAR) 50 MG tablet take 1 tablet by mouth once daily   . metoprolol succinate (TOPROL-XL) 25 MG 24 hr tablet TAKE 1 TABLET DAILY   . montelukast (SINGULAIR) 10 MG tablet Take 1 tablet (10 mg total) by mouth at bedtime.   . pioglitazone (ACTOS) 45 MG tablet Take 1  tablet (45 mg total) by mouth daily.   . psyllium (METAMUCIL SMOOTH TEXTURE) 28 % packet Take by mouth. 11/14/2014: Received from: [facility]  . vitamin C (ASCORBIC ACID) 500 MG tablet Take by mouth.   . [DISCONTINUED] atorvastatin (LIPITOR) 40 MG tablet TAKE 1 TABLET AT BEDTIME   . [DISCONTINUED] naproxen (NAPROSYN) 500 MG tablet Take 1 tablet (500 mg total) by mouth 2 (two) times daily with a meal. (Patient not taking: Reported on 03/24/2016)    No facility-administered encounter medications on file as of 11/15/2016.     No Known Allergies  Review of Systems  Constitutional: Negative.   HENT: Positive for hearing loss (wears hearing aids).   Respiratory: Negative.   Cardiovascular: Negative.   Gastrointestinal: Negative.   Musculoskeletal: Positive for joint pain (sometimes thumb pain.).  Neurological: Negative.   Psychiatric/Behavioral: Negative.     Objective:  BP 138/64   Pulse 76   Temp 97.7 F (36.5 C)   Resp 14   Wt 179 lb (81.2 kg)   BMI 28.04 kg/m   Physical Exam  Constitutional: He is oriented to person, place, and time and well-developed, well-nourished, and in no distress.  HENT:  Head: Normocephalic and atraumatic.  Eyes: Pupils are equal, round, and reactive to light. Conjunctivae are normal.  Neck: Normal range of motion. Neck supple.  Cardiovascular: Normal rate, regular rhythm, normal heart sounds and intact distal pulses.  Exam reveals no gallop.   No murmur heard. Pulmonary/Chest: Effort normal and breath sounds normal. No respiratory distress. He has no wheezes.  Musculoskeletal: He exhibits no edema or tenderness.  Neurological: He is alert and oriented to person, place, and time.  Psychiatric: Mood, memory, affect and judgment normal.   Diabetic Foot Exam - Simple   Simple Foot Form Diabetic Foot exam was performed with the following findings:  Yes 11/15/2016  8:49 AM  Visual Inspection No deformities, no ulcerations, no other skin  breakdown bilaterally:  Yes Sensation Testing Intact to touch and monofilament testing bilaterally:  Yes Pulse Check Posterior Tibialis and Dorsalis pulse intact bilaterally:  Yes Comments    Assessment and Plan :  1. Type 2 diabetes mellitus without complication, without long-term current use of insulin (HCC) 7.1 today-better, continue working on habits. Advised patient if hypoglycemic episodes become frequent will need to adjust medication at that time. Advised patient to check his sugar when he gets hypoglycemic episodes. - POCT HgB A1C - POCT UA - Microalbumin-negative  2. Seasonal allergic rhinitis due to pollen Stable on current regimen.  3. Essential (primary) hypertension Stable on current regimen.  4. Other hyperlipidemia Stable on the last check.  5. Influenza vaccination declined by patient Patient gets this at the pharmacy. 6. CAD All risk factors treated and patient feels well lately.  HPI, Exam and A&P transcribed by Tiffany Kocher, RMA under direction and in the presence of Miguel Aschoff, MD. I have done the exam and reviewed the chart and it is accurate to the best  of my knowledge. Development worker, community has been used and  any errors in dictation or transcription are unintentional. Miguel Aschoff M.D. [facility]

## 2016-12-03 DIAGNOSIS — Z23 Encounter for immunization: Secondary | ICD-10-CM | POA: Diagnosis not present

## 2017-01-03 DIAGNOSIS — H401131 Primary open-angle glaucoma, bilateral, mild stage: Secondary | ICD-10-CM | POA: Diagnosis not present

## 2017-01-12 DIAGNOSIS — L821 Other seborrheic keratosis: Secondary | ICD-10-CM | POA: Diagnosis not present

## 2017-01-12 DIAGNOSIS — L720 Epidermal cyst: Secondary | ICD-10-CM | POA: Diagnosis not present

## 2017-01-12 DIAGNOSIS — D485 Neoplasm of uncertain behavior of skin: Secondary | ICD-10-CM | POA: Diagnosis not present

## 2017-01-12 DIAGNOSIS — M721 Knuckle pads: Secondary | ICD-10-CM | POA: Diagnosis not present

## 2017-01-12 DIAGNOSIS — L570 Actinic keratosis: Secondary | ICD-10-CM | POA: Diagnosis not present

## 2017-01-12 DIAGNOSIS — L814 Other melanin hyperpigmentation: Secondary | ICD-10-CM | POA: Diagnosis not present

## 2017-01-31 DIAGNOSIS — Z23 Encounter for immunization: Secondary | ICD-10-CM | POA: Diagnosis not present

## 2017-03-04 ENCOUNTER — Ambulatory Visit (INDEPENDENT_AMBULATORY_CARE_PROVIDER_SITE_OTHER): Payer: Medicare Other

## 2017-03-04 VITALS — BP 122/68 | HR 64 | Temp 97.5°F | Ht 67.0 in | Wt 184.0 lb

## 2017-03-04 DIAGNOSIS — Z0000 Encounter for general adult medical examination without abnormal findings: Secondary | ICD-10-CM | POA: Diagnosis not present

## 2017-03-04 NOTE — Patient Instructions (Addendum)
Carl Dominguez , Thank you for taking time to come for your Medicare Wellness Visit. I appreciate your ongoing commitment to your health goals. Please review the following plan we discussed and let me know if I can assist you in the future.   Screening recommendations/referrals: Colonoscopy: Up to date Recommended yearly ophthalmology/optometry visit for glaucoma screening and checkup Recommended yearly dental visit for hygiene and checkup  Vaccinations: Influenza vaccine: Up to date Pneumococcal vaccine: Up to date Tdap vaccine: Up to date Shingles vaccine: Received first dose of Shingrix on 12/03/16 .  Advanced directives: Please bring a copy of your POA (Power of Attorney) and/or Living Will to your next appointment.   Conditions/risks identified: Recommend increasing water intake to 4 glasses a day.   Next appointment: 03/10/16 @ 8:30 AM  Preventive Care 65 Years and Older, Male Preventive care refers to lifestyle choices and visits with your health care provider that can promote health and wellness. What does preventive care include?  A yearly physical exam. This is also called an annual well check.  Dental exams once or twice a year.  Routine eye exams. Ask your health care provider how often you should have your eyes checked.  Personal lifestyle choices, including:  Daily care of your teeth and gums.  Regular physical activity.  Eating a healthy diet.  Avoiding tobacco and drug use.  Limiting alcohol use.  Practicing safe sex.  Taking low doses of aspirin every day.  Taking vitamin and mineral supplements as recommended by your health care provider. What happens during an annual well check? The services and screenings done by your health care provider during your annual well check will depend on your age, overall health, lifestyle risk factors, and family history of disease. Counseling  Your health care provider may ask you questions about your:  Alcohol  use.  Tobacco use.  Drug use.  Emotional well-being.  Home and relationship well-being.  Sexual activity.  Eating habits.  History of falls.  Memory and ability to understand (cognition).  Work and work Statistician. Screening  You may have the following tests or measurements:  Height, weight, and BMI.  Blood pressure.  Lipid and cholesterol levels. These may be checked every 5 years, or more frequently if you are over 26 years old.  Skin check.  Lung cancer screening. You may have this screening every year starting at age 90 if you have a 30-pack-year history of smoking and currently smoke or have quit within the past 15 years.  Fecal occult blood test (FOBT) of the stool. You may have this test every year starting at age 64.  Flexible sigmoidoscopy or colonoscopy. You may have a sigmoidoscopy every 5 years or a colonoscopy every 10 years starting at age 31.  Prostate cancer screening. Recommendations will vary depending on your family history and other risks.  Hepatitis C blood test.  Hepatitis B blood test.  Sexually transmitted disease (STD) testing.  Diabetes screening. This is done by checking your blood sugar (glucose) after you have not eaten for a while (fasting). You may have this done every 1-3 years.  Abdominal aortic aneurysm (AAA) screening. You may need this if you are a current or former smoker.  Osteoporosis. You may be screened starting at age 32 if you are at high risk. Talk with your health care provider about your test results, treatment options, and if necessary, the need for more tests. Vaccines  Your health care provider may recommend certain vaccines, such as:  Influenza  vaccine. This is recommended every year.  Tetanus, diphtheria, and acellular pertussis (Tdap, Td) vaccine. You may need a Td booster every 10 years.  Zoster vaccine. You may need this after age 64.  Pneumococcal 13-valent conjugate (PCV13) vaccine. One dose is  recommended after age 80.  Pneumococcal polysaccharide (PPSV23) vaccine. One dose is recommended after age 63. Talk to your health care provider about which screenings and vaccines you need and how often you need them. This information is not intended to replace advice given to you by your health care provider. Make sure you discuss any questions you have with your health care provider. Document Released: 03/14/2015 Document Revised: 11/05/2015 Document Reviewed: 12/17/2014 Elsevier Interactive Patient Education  2017 [place] Prevention in the Home Falls can cause injuries. They can happen to people of all ages. There are many things you can do to make your home safe and to help prevent falls. What can I do on the outside of my home?  Regularly fix the edges of walkways and driveways and fix any cracks.  Remove anything that might make you trip as you walk through a door, such as a raised step or threshold.  Trim any bushes or trees on the path to your home.  Use bright outdoor lighting.  Clear any walking paths of anything that might make someone trip, such as rocks or tools.  Regularly check to see if handrails are loose or broken. Make sure that both sides of any steps have handrails.  Any raised decks and porches should have guardrails on the edges.  Have any leaves, snow, or ice cleared regularly.  Use sand or salt on walking paths during winter.  Clean up any spills in your garage right away. This includes oil or grease spills. What can I do in the bathroom?  Use night lights.  Install grab bars by the toilet and in the tub and shower. Do not use towel bars as grab bars.  Use non-skid mats or decals in the tub or shower.  If you need to sit down in the shower, use a plastic, non-slip stool.  Keep the floor dry. Clean up any water that spills on the floor as soon as it happens.  Remove soap buildup in the tub or shower regularly.  Attach bath mats  securely with double-sided non-slip rug tape.  Do not have throw rugs and other things on the floor that can make you trip. What can I do in the bedroom?  Use night lights.  Make sure that you have a light by your bed that is easy to reach.  Do not use any sheets or blankets that are too big for your bed. They should not hang down onto the floor.  Have a firm chair that has side arms. You can use this for support while you get dressed.  Do not have throw rugs and other things on the floor that can make you trip. What can I do in the kitchen?  Clean up any spills right away.  Avoid walking on wet floors.  Keep items that you use a lot in easy-to-reach places.  If you need to reach something above you, use a strong step stool that has a grab bar.  Keep electrical cords out of the way.  Do not use floor polish or wax that makes floors slippery. If you must use wax, use non-skid floor wax.  Do not have throw rugs and other things on the floor that can  make you trip. What can I do with my stairs?  Do not leave any items on the stairs.  Make sure that there are handrails on both sides of the stairs and use them. Fix handrails that are broken or loose. Make sure that handrails are as long as the stairways.  Check any carpeting to make sure that it is firmly attached to the stairs. Fix any carpet that is loose or worn.  Avoid having throw rugs at the top or bottom of the stairs. If you do have throw rugs, attach them to the floor with carpet tape.  Make sure that you have a light switch at the top of the stairs and the bottom of the stairs. If you do not have them, ask someone to add them for you. What else can I do to help prevent falls?  Wear shoes that:  Do not have high heels.  Have rubber bottoms.  Are comfortable and fit you well.  Are closed at the toe. Do not wear sandals.  If you use a stepladder:  Make sure that it is fully opened. Do not climb a closed  stepladder.  Make sure that both sides of the stepladder are locked into place.  Ask someone to hold it for you, if possible.  Clearly mark and make sure that you can see:  Any grab bars or handrails.  First and last steps.  Where the edge of each step is.  Use tools that help you move around (mobility aids) if they are needed. These include:  Canes.  Walkers.  Scooters.  Crutches.  Turn on the lights when you go into a dark area. Replace any light bulbs as soon as they burn out.  Set up your furniture so you have a clear path. Avoid moving your furniture around.  If any of your floors are uneven, fix them.  If there are any pets around you, be aware of where they are.  Review your medicines with your doctor. Some medicines can make you feel dizzy. This can increase your chance of falling. Ask your doctor what other things that you can do to help prevent falls. This information is not intended to replace advice given to you by your health care provider. Make sure you discuss any questions you have with your health care provider. Document Released: 12/12/2008 Document Revised: 07/24/2015 Document Reviewed: 03/22/2014 Elsevier Interactive Patient Education  2017 [employer].

## 2017-03-04 NOTE — Progress Notes (Signed)
Subjective:   Carl Dominguez is a 72 y.o. male who presents for Medicare Annual/Subsequent preventive examination.  Review of Systems:  N/A  Cardiac Risk Factors include: advanced age (>28men, >68 women);diabetes mellitus;dyslipidemia;hypertension;male gender     Objective:    Vitals: BP 122/68 (BP Location: Left Arm)   Pulse 64   Temp (!) 97.5 F (36.4 C) (Oral)   Ht 5\' 7"  (1.702 m)   Wt 184 lb (83.5 kg)   BMI 28.82 kg/m   Body mass index is 28.82 kg/m.  Advanced Directives 03/04/2017 03/24/2016  Does Patient Have a Medical Advance Directive? Yes Yes  Type of Advance Directive Living will Living will  Does patient want to make changes to medical advance directive? - No - Patient declined    Tobacco Social History   Tobacco Use  Smoking Status Former Smoker  . Types: Cigarettes  . Last attempt to quit: 03/01/1982  . Years since quitting: 35.0  Smokeless Tobacco Never Used  Tobacco Comment   has been quit for 30 years     Counseling given: Not Answered Comment: has been quit for 30 years   Clinical Intake:  Pre-visit preparation completed: Yes  Pain : No/denies pain Pain Score: 0-No pain     Nutritional Status: BMI 25 -29 Overweight Nutritional Risks: None Diabetes: Yes CBG done?: No Did pt. bring in CBG monitor from home?: No  How often do you need to have someone help you when you read instructions, pamphlets, or other written materials from your doctor or pharmacy?: 1 - Never  Interpreter Needed?: No  Information entered by :: [facility], LPN  Past Medical History:  Diagnosis Date  . Diabetes mellitus without complication ([place])   . Heart disease   . Hyperlipidemia   . Hypertension   . Myocardial infarction ([place]) 1999   Past Surgical History:  Procedure Laterality Date  . CATARACT EXTRACTION W/PHACO Right 03/24/2016   Procedure: CATARACT EXTRACTION PHACO AND INTRAOCULAR LENS PLACEMENT ([place])  Right;  Surgeon: Leandrew Koyanagi, MD;  Location:  [facility];  Service: Ophthalmology;  Laterality: Right;  Diabetic - oral meds right eye  . CHOLECYSTECTOMY  2010  . CORONARY ANGIOPLASTY WITH STENT PLACEMENT  1999  . HERNIA REPAIR     History reviewed. No pertinent family history. Social History   Socioeconomic History  . Marital status: Married    Spouse name: None  . Number of children: 2  . Years of education: None  . Highest education level: Some college, no degree  Social Needs  . Financial resource strain: Not hard at all  . Food insecurity - worry: Never true  . Food insecurity - inability: Never true  . Transportation needs - medical: No  . Transportation needs - non-medical: No  Occupational History  . Occupation: retired  Tobacco Use  . Smoking status: Former Smoker    Types: Cigarettes    Last attempt to quit: 03/01/1982    Years since quitting: 35.0  . Smokeless tobacco: Never Used  . Tobacco comment: has been quit for 30 years  Substance and Sexual Activity  . Alcohol use: Yes    Comment: occasionally- beer  . Drug use: No  . Sexual activity: None  Other Topics Concern  . None  Social History Narrative  . None    Outpatient Encounter Medications as of 03/04/2017  Medication Sig  . aspirin 81 MG tablet Take 81 mg by mouth daily.   Marland Kitchen atorvastatin (LIPITOR) 40 MG tablet Take 1  tablet (40 mg total) by mouth at bedtime.  . cholecalciferol (VITAMIN D) 1000 UNITS tablet Take 1,000 Units by mouth daily.   . CHOLESTYRAMINE PO Take by mouth. Takes once daily  . famotidine (PEPCID) 20 MG tablet Take 20 mg by mouth daily.   . fluticasone (FLONASE) 50 MCG/ACT nasal spray Place 2 sprays into both nostrils daily.   Marland Kitchen glipiZIDE (GLUCOTROL) 10 MG tablet TAKE 1 TABLET DAILY  . glucose blood (FREESTYLE TEST STRIPS) test strip Check sugar once daily DX E11.9  . ibuprofen (ADVIL,MOTRIN) 200 MG tablet Take 200 mg by mouth every 6 (six) hours as needed.   . latanoprost (XALATAN) 0.005 % ophthalmic solution Place 1  drop into both eyes at bedtime.   Marland Kitchen loratadine (CLARITIN) 10 MG tablet Take 1 tablet (10 mg total) by mouth daily.  Marland Kitchen losartan (COZAAR) 50 MG tablet take 1 tablet by mouth once daily  . metoprolol succinate (TOPROL-XL) 25 MG 24 hr tablet TAKE 1 TABLET DAILY  . montelukast (SINGULAIR) 10 MG tablet Take 1 tablet (10 mg total) by mouth at bedtime.  . pioglitazone (ACTOS) 45 MG tablet Take 1 tablet (45 mg total) by mouth daily.  . psyllium (METAMUCIL SMOOTH TEXTURE) 28 % packet Take 1 packet by mouth 2 (two) times daily.   . vitamin C (ASCORBIC ACID) 500 MG tablet Take 500 mg by mouth daily.    No facility-administered encounter medications on file as of 03/04/2017.     Activities of Daily Living In your present state of health, do you have any difficulty performing the following activities: 03/04/2017 03/24/2016  Hearing? Tempie Donning  Comment wears bilateral hearing aids -  Vision? Y N  Comment wears eyeglasses -  Difficulty concentrating or making decisions? N N  Walking or climbing stairs? N N  Dressing or bathing? N N  Doing errands, shopping? N -  Preparing Food and eating ? N -  Using the Toilet? N -  In the past six months, have you accidently leaked urine? Y -  Comment only if he does not go when he receives the urge -  Do you have problems with loss of bowel control? N -  Managing your Medications? N -  Managing your Finances? N -  Housekeeping or managing your Housekeeping? N -  Some recent data might be hidden    Patient Care Team: Jerrol Banana., MD as PCP - General (Family Medicine) Leandrew Koyanagi, MD as Referring Physician (Ophthalmology) Richmond Campbell, MD as Consulting Physician (Gastroenterology)   Assessment:   This is a routine wellness examination for Carl Dominguez.  Exercise Activities and Dietary recommendations Current Exercise Habits: The patient does not participate in regular exercise at present, Exercise limited by: Other - see comments(due to the  weather)  Goals    . DIET - INCREASE WATER INTAKE     Recommend increasing water intake to 4 glasses a day.        Fall Risk Fall Risk  03/04/2017 11/15/2016 11/05/2015 03/24/2015 11/14/2014  Falls in the past year? No No No No No   Is the patient's home free of loose throw rugs in walkways, pet beds, electrical cords, etc?   yes      Grab bars in the bathroom? No, pt does not feel it is necessary at this time.       Handrails on the stairs? N/A      Adequate lighting?   yes  Timed Get Up and Go Performed: N/A  Depression  Screen PHQ 2/9 Scores 03/04/2017 11/15/2016 11/05/2015 03/24/2015  PHQ - 2 Score 0 0 0 0    Cognitive Function:     6CIT Screen 03/04/2017  What Year? 0 points  What month? 0 points  What time? 0 points  Count back from 20 0 points  Months in reverse 0 points  Repeat phrase 2 points  Total Score 2   Immunization History  Administered Date(s) Administered  . Influenza, High Dose Seasonal PF 12/01/2015, 12/03/2016  . Influenza-Unspecified 11/30/2014  . Pneumococcal Conjugate-13 12/07/2012, 03/14/2014  . Pneumococcal Polysaccharide-23 02/03/2005, 12/13/2008  . Tdap 12/06/2007  . Zoster 12/31/2010  . Zoster Recombinat (Shingrix) 12/03/2016    Qualifies for Shingles Vaccine? Yes, for second dose of Shingrix. Received first dose on 12/03/16.  Screening Tests Health Maintenance  Topic Date Due  . HEMOGLOBIN A1C  05/15/2017  . COLONOSCOPY  05/18/2017  . OPHTHALMOLOGY EXAM  07/06/2017  . FOOT EXAM  11/15/2017  . TETANUS/TDAP  12/05/2017  . INFLUENZA VACCINE  Completed  . Hepatitis C Screening  Completed  . PNA vac Low Risk Adult  Completed   Cancer Screenings: Lung: Low Dose CT Chest recommended if Age 61-80 years, 30 pack-year currently smoking OR have quit w/in 15years. Patient does not qualify. Colorectal: Up to date  Additional Screenings:  Hepatitis B/HIV/Syphillis: Pt declines today.  Hepatitis C Screening: Pt declines today.     Plan:  I have  personally reviewed and addressed the Medicare Annual Wellness questionnaire and have noted the following in the patient's chart:  A. Medical and social history B. Use of alcohol, tobacco or illicit drugs  C. Current medications and supplements D. Functional ability and status E.  Nutritional status F.  Physical activity G. Advance directives H. List of other physicians I.  Hospitalizations, surgeries, and ER visits in previous 12 months J.  [place] such as hearing and vision if needed, cognitive and depression L. Referrals and appointments - none  In addition, I have reviewed and discussed with patient certain preventive protocols, quality metrics, and best practice recommendations. A written personalized care plan for preventive services as well as general preventive health recommendations were provided to patient.  See attached scanned questionnaire for additional information.   Signed,  Fabio Neighbors, LPN Nurse Health Advisor   Nurse Recommendations: None.

## 2017-03-10 ENCOUNTER — Ambulatory Visit (INDEPENDENT_AMBULATORY_CARE_PROVIDER_SITE_OTHER): Payer: Medicare Other | Admitting: Family Medicine

## 2017-03-10 ENCOUNTER — Other Ambulatory Visit: Payer: Self-pay

## 2017-03-10 VITALS — BP 118/52 | HR 60 | Temp 97.9°F | Resp 16 | Wt 179.0 lb

## 2017-03-10 DIAGNOSIS — J302 Other seasonal allergic rhinitis: Secondary | ICD-10-CM | POA: Diagnosis not present

## 2017-03-10 DIAGNOSIS — E119 Type 2 diabetes mellitus without complications: Secondary | ICD-10-CM

## 2017-03-10 DIAGNOSIS — E7800 Pure hypercholesterolemia, unspecified: Secondary | ICD-10-CM | POA: Diagnosis not present

## 2017-03-10 DIAGNOSIS — I10 Essential (primary) hypertension: Secondary | ICD-10-CM | POA: Diagnosis not present

## 2017-03-10 DIAGNOSIS — Z1211 Encounter for screening for malignant neoplasm of colon: Secondary | ICD-10-CM

## 2017-03-10 MED ORDER — FLUTICASONE PROPIONATE 50 MCG/ACT NA SUSP: 2.0000 | Freq: Every day | NASAL | 12 refills | Status: DC

## 2017-03-10 NOTE — Progress Notes (Signed)
Carl Dominguez  MRN: [id number] DOB: 1946/02/10  Subjective:  HPI   The patient is a 72 year old male who presents for a follow up to his annual wellness exam done by the nurse health advisor on 03/04/17.  His last visit for chronic issues was on 11/15/16.  Diabetes-The patient's ;ast A1C was done on 11/15/16 and it was 7.1.  Patient was asked to check his glucose at home and if he was having hypoglycemic events we would look at making adjustments with his medication.  He is up to date on his micro-albumin and foot exam.  Hypertension-The patient's blood pressure has been stable the last few visits.  He reports no adverse effects of the medicine he is taking.  BP Readings from Last 3 Encounters:  03/10/17 (!) 118/52  03/04/17 122/68  11/15/16 138/64   Hypercholesterolemia-patient is due for his lipid check today.  Immunization History  Administered Date(s) Administered  . Influenza, High Dose Seasonal PF 12/01/2015, 12/03/2016  . Influenza-Unspecified 11/30/2014  . Pneumococcal Conjugate-13 12/07/2012, 03/14/2014  . Pneumococcal Polysaccharide-23 02/03/2005, 12/13/2008  . Tdap 12/06/2007  . Zoster 12/31/2010  . Zoster Recombinat (Shingrix) 12/03/2016    Colon cancer screening: 05/18/12 Colonoscopy-Diverticulosis, internal hemorrhoids and polyps.  Recommend repeat in 2019   Patient Active Problem List   Diagnosis Date Noted  . Actinic keratosis 11/14/2014  . CAD in native artery 11/14/2014  . Cognitive complaints 11/14/2014  . Diabetes ([place]) 11/14/2014  . Essential (primary) hypertension 11/14/2014  . Gastro-esophageal reflux disease without esophagitis 11/14/2014  . Hemorrhage of gastrointestinal tract 11/14/2014  . Internal hemorrhoids 11/14/2014  . Personal history of tobacco use, presenting hazards to health 11/14/2014  . HLD (hyperlipidemia) 11/14/2014  . Allergic state 11/14/2014  . Arthritis of hand, degenerative 11/14/2014  . Allergic rhinitis, seasonal  11/14/2014  . Prostatism 11/14/2014  . Hypercholesterolemia without hypertriglyceridemia 11/14/2014  . Change in blood platelet count 11/14/2014  . Diabetes mellitus, type 2 ([place]) 11/14/2014  . Avitaminosis D 11/14/2014    Past Medical History:  Diagnosis Date  . Diabetes mellitus without complication ([place])   . Heart disease   . Hyperlipidemia   . Hypertension   . Myocardial infarction [facility]) 1999    Social History   Socioeconomic History  . Marital status: Married    Spouse name: Not on file  . Number of children: 2  . Years of education: Not on file  . Highest education level: Some college, no degree  Social Needs  . Financial resource strain: Not hard at all  . Food insecurity - worry: Never true  . Food insecurity - inability: Never true  . Transportation needs - medical: No  . Transportation needs - non-medical: No  Occupational History  . Occupation: retired  Tobacco Use  . Smoking status: Former Smoker    Types: Cigarettes    Last attempt to quit: 03/01/1982    Years since quitting: 35.0  . Smokeless tobacco: Never Used  . Tobacco comment: has been quit for 30 years  Substance and Sexual Activity  . Alcohol use: Yes    Comment: occasionally- beer  . Drug use: No  . Sexual activity: Not on file  Other Topics Concern  . Not on file  Social History Narrative  . Not on file    Outpatient Encounter Medications as of 03/10/2017  Medication Sig Note  . aspirin 81 MG tablet Take 81 mg by mouth daily.  11/14/2014: Received from: [facility]  . atorvastatin (LIPITOR)  40 MG tablet Take 1 tablet (40 mg total) by mouth at bedtime.   . cholecalciferol (VITAMIN D) 1000 UNITS tablet Take 1,000 Units by mouth daily.  11/14/2014: Received from: [facility]  . CHOLESTYRAMINE PO Take by mouth. Takes once daily 11/14/2014: Received from: [facility]  . famotidine (PEPCID) 20 MG tablet Take 20 mg by mouth daily.  11/14/2014:  Received from: [facility]  . fluticasone (FLONASE) 50 MCG/ACT nasal spray Place 2 sprays into both nostrils daily.   Marland Kitchen glipiZIDE (GLUCOTROL) 10 MG tablet TAKE 1 TABLET DAILY   . glucose blood (FREESTYLE TEST STRIPS) test strip Check sugar once daily DX E11.9   . ibuprofen (ADVIL,MOTRIN) 200 MG tablet Take 200 mg by mouth every 6 (six) hours as needed.  11/14/2014: Received from: [facility]  . latanoprost (XALATAN) 0.005 % ophthalmic solution Place 1 drop into both eyes at bedtime.  11/14/2014: Received from: [facility]  . loratadine (CLARITIN) 10 MG tablet Take 1 tablet (10 mg total) by mouth daily.   Marland Kitchen losartan (COZAAR) 50 MG tablet take 1 tablet by mouth once daily   . metoprolol succinate (TOPROL-XL) 25 MG 24 hr tablet TAKE 1 TABLET DAILY   . montelukast (SINGULAIR) 10 MG tablet Take 1 tablet (10 mg total) by mouth at bedtime.   . pioglitazone (ACTOS) 45 MG tablet Take 1 tablet (45 mg total) by mouth daily.   . psyllium (METAMUCIL SMOOTH TEXTURE) 28 % packet Take 1 packet by mouth 2 (two) times daily.  11/14/2014: Received from: [facility]  . vitamin C (ASCORBIC ACID) 500 MG tablet Take 500 mg by mouth daily.    . [DISCONTINUED] fluticasone (FLONASE) 50 MCG/ACT nasal spray Place 2 sprays into both nostrils daily.  11/14/2014: Takes PRN   No facility-administered encounter medications on file as of 03/10/2017.     No Known Allergies  Review of Systems  Constitutional: Negative.   HENT: Negative.   Eyes: Negative.   Cardiovascular: Negative.   Gastrointestinal: Negative.        Anal bleed, known internal hemorrhoid.  Genitourinary: Negative.   Musculoskeletal: Negative.   Skin: Negative.   Neurological: Negative.   Endo/Heme/Allergies: Negative.   Psychiatric/Behavioral: Negative.     Objective:  BP (!) 118/52 (BP Location: Right Arm, Patient Position: Sitting, Cuff Size: Normal)   Pulse 60   Temp 97.9 F  (36.6 C) (Oral)   Resp 16   Wt 179 lb (81.2 kg)   BMI 28.04 kg/m   Physical Exam  Constitutional: He is oriented to person, place, and time and well-developed, well-nourished, and in no distress.  HENT:  Head: Normocephalic and atraumatic.  Right Ear: External ear normal.  Left Ear: External ear normal.  Nose: Nose normal.  Mouth/Throat: Oropharynx is clear and moist.  Eyes: Conjunctivae and EOM are normal. Pupils are equal, round, and reactive to light.  Neck: Normal range of motion. Neck supple.  Cardiovascular: Normal rate, regular rhythm, normal heart sounds and intact distal pulses.  Pulmonary/Chest: Effort normal and breath sounds normal.  Abdominal: Soft. Bowel sounds are normal.  Genitourinary: Rectum normal, prostate normal and penis normal.  Musculoskeletal: Normal range of motion.  Neurological: He is alert and oriented to person, place, and time. Gait normal. GCS score is 15.  Skin: Skin is warm and dry.  Psychiatric: Mood, memory, affect and judgment normal.    Assessment and Plan :  Type 2 diabetes mellitus without complication, without long-term current use  of insulin ([place])  Essential (primary) hypertension  Hypercholesterolemia without hypertriglyceridemia  Seasonal allergic rhinitis, unspecified trigger - Plan: fluticasone (FLONASE) 50 MCG/ACT nasal spray  I have done the exam and reviewed the chart and it is accurate to the best of my knowledge. Development worker, community has been used and  any errors in dictation or transcription are unintentional. Miguel Aschoff M.D. [facility]

## 2017-03-11 LAB — CBC WITH DIFFERENTIAL/PLATELET
Basophils Absolute: 0 10*3/uL (ref 0.0–0.2)
Basos: 0 %
EOS (ABSOLUTE): 0.3 10*3/uL (ref 0.0–0.4)
Eos: 3 %
Hematocrit: 42.3 % (ref 37.5–51.0)
Hemoglobin: 14.5 g/dL (ref 13.0–17.7)
Immature Grans (Abs): 0 10*3/uL (ref 0.0–0.1)
Immature Granulocytes: 0 %
Lymphocytes Absolute: 1.8 10*3/uL (ref 0.7–3.1)
Lymphs: 22 %
MCH: 31.7 pg (ref 26.6–33.0)
MCHC: 34.3 g/dL (ref 31.5–35.7)
MCV: 93 fL (ref 79–97)
Monocytes Absolute: 0.7 10*3/uL (ref 0.1–0.9)
Monocytes: 9 %
Neutrophils Absolute: 5.4 10*3/uL (ref 1.4–7.0)
Neutrophils: 66 %
Platelets: 148 10*3/uL — ABNORMAL LOW (ref 150–379)
RBC: 4.57 x10E6/uL (ref 4.14–5.80)
RDW: 14 % (ref 12.3–15.4)
WBC: 8.2 10*3/uL (ref 3.4–10.8)

## 2017-03-11 LAB — COMPREHENSIVE METABOLIC PANEL
ALT: 15 IU/L (ref 0–44)
AST: 19 IU/L (ref 0–40)
Albumin/Globulin Ratio: 1.9 (ref 1.2–2.2)
Albumin: 4.4 g/dL (ref 3.5–4.8)
Alkaline Phosphatase: 57 IU/L (ref 39–117)
BUN/Creatinine Ratio: 18 (ref 10–24)
BUN: 19 mg/dL (ref 8–27)
Bilirubin Total: 1.3 mg/dL — ABNORMAL HIGH (ref 0.0–1.2)
CO2: 25 mmol/L (ref 20–29)
Calcium: 9.1 mg/dL (ref 8.6–10.2)
Chloride: 105 mmol/L (ref 96–106)
Creatinine, Ser: 1.05 mg/dL (ref 0.76–1.27)
GFR calc Af Amer: 81 mL/min/{1.73_m2} (ref >59)
GFR calc non Af Amer: 70 mL/min/{1.73_m2} (ref >59)
Globulin, Total: 2.3 g/dL (ref 1.5–4.5)
Glucose: 167 mg/dL — ABNORMAL HIGH (ref 65–99)
Potassium: 4.6 mmol/L (ref 3.5–5.2)
Sodium: 142 mmol/L (ref 134–144)
Total Protein: 6.7 g/dL (ref 6.0–8.5)

## 2017-03-11 LAB — LIPID PANEL WITH LDL/HDL RATIO
Cholesterol, Total: 123 mg/dL (ref 100–199)
HDL: 44 mg/dL (ref >39)
LDL Calculated: 54 mg/dL (ref 0–99)
LDl/HDL Ratio: 1.2 ratio (ref 0.0–3.6)
Triglycerides: 127 mg/dL (ref 0–149)
VLDL Cholesterol Cal: 25 mg/dL (ref 5–40)

## 2017-03-11 LAB — HEMOGLOBIN A1C
Est. average glucose Bld gHb Est-mCnc: 169 mg/dL
Hgb A1c MFr Bld: 7.5 % — ABNORMAL HIGH (ref 4.8–5.6)

## 2017-03-11 LAB — TSH: TSH: 3.43 u[IU]/mL (ref 0.450–4.500)

## 2017-03-16 ENCOUNTER — Telehealth: Payer: Self-pay

## 2017-03-16 NOTE — Telephone Encounter (Signed)
Patient calling requesting his labs results. I looks like they still need to be review.  Thanks,  -Carl Dominguez

## 2017-03-18 ENCOUNTER — Other Ambulatory Visit: Payer: Self-pay | Admitting: Family Medicine

## 2017-04-04 DIAGNOSIS — Z86010 Personal history of colonic polyps: Secondary | ICD-10-CM | POA: Diagnosis not present

## 2017-04-04 DIAGNOSIS — D120 Benign neoplasm of cecum: Secondary | ICD-10-CM | POA: Diagnosis not present

## 2017-04-04 DIAGNOSIS — D122 Benign neoplasm of ascending colon: Secondary | ICD-10-CM | POA: Diagnosis not present

## 2017-04-04 DIAGNOSIS — K648 Other hemorrhoids: Secondary | ICD-10-CM | POA: Diagnosis not present

## 2017-04-04 DIAGNOSIS — K635 Polyp of colon: Secondary | ICD-10-CM | POA: Diagnosis not present

## 2017-04-04 DIAGNOSIS — Z1211 Encounter for screening for malignant neoplasm of colon: Secondary | ICD-10-CM | POA: Diagnosis not present

## 2017-04-04 DIAGNOSIS — K5730 Diverticulosis of large intestine without perforation or abscess without bleeding: Secondary | ICD-10-CM | POA: Diagnosis not present

## 2017-04-04 LAB — HM COLONOSCOPY

## 2017-04-05 DIAGNOSIS — R011 Cardiac murmur, unspecified: Secondary | ICD-10-CM | POA: Diagnosis not present

## 2017-04-05 DIAGNOSIS — I208 Other forms of angina pectoris: Secondary | ICD-10-CM | POA: Diagnosis not present

## 2017-04-05 DIAGNOSIS — R0602 Shortness of breath: Secondary | ICD-10-CM | POA: Diagnosis not present

## 2017-04-05 DIAGNOSIS — R001 Bradycardia, unspecified: Secondary | ICD-10-CM | POA: Diagnosis not present

## 2017-04-05 DIAGNOSIS — I209 Angina pectoris, unspecified: Secondary | ICD-10-CM | POA: Diagnosis not present

## 2017-04-05 DIAGNOSIS — E785 Hyperlipidemia, unspecified: Secondary | ICD-10-CM | POA: Diagnosis not present

## 2017-04-05 DIAGNOSIS — K219 Gastro-esophageal reflux disease without esophagitis: Secondary | ICD-10-CM | POA: Diagnosis not present

## 2017-04-05 DIAGNOSIS — I10 Essential (primary) hypertension: Secondary | ICD-10-CM | POA: Diagnosis not present

## 2017-04-05 DIAGNOSIS — E669 Obesity, unspecified: Secondary | ICD-10-CM | POA: Diagnosis not present

## 2017-04-05 DIAGNOSIS — I2510 Atherosclerotic heart disease of native coronary artery without angina pectoris: Secondary | ICD-10-CM | POA: Diagnosis not present

## 2017-04-06 ENCOUNTER — Encounter: Payer: Self-pay | Admitting: Emergency Medicine

## 2017-04-15 ENCOUNTER — Encounter: Payer: Self-pay | Admitting: Family Medicine

## 2017-04-18 ENCOUNTER — Other Ambulatory Visit: Payer: Self-pay | Admitting: Family Medicine

## 2017-06-07 ENCOUNTER — Other Ambulatory Visit: Payer: Self-pay | Admitting: Family Medicine

## 2017-06-07 DIAGNOSIS — E119 Type 2 diabetes mellitus without complications: Secondary | ICD-10-CM

## 2017-06-07 MED ORDER — GLUCOSE BLOOD VI STRP
ORAL_STRIP | 3 refills | Status: DC
Start: 2017-06-07 — End: 2018-06-27

## 2017-06-07 NOTE — Telephone Encounter (Signed)
Pt advised we had not received anything about this until this rx request. And we had not received anything about a PA. Will keep an eye out incase he needs one for this. Have sent in the refill.

## 2017-06-07 NOTE — Telephone Encounter (Signed)
Pt contacted office for refill request on the following medications:  glucose blood (FREESTYLE TEST STRIPS) test strip  [facility] [employer]  Pt stated that his pharmacy was supposed to request a refill on his test strips last week and he was advised that the pharmacy had faxed over a form to our office so that insurance would cover the Rx. Please advise. Thanks TNP

## 2017-07-04 DIAGNOSIS — H401131 Primary open-angle glaucoma, bilateral, mild stage: Secondary | ICD-10-CM | POA: Diagnosis not present

## 2017-07-05 ENCOUNTER — Other Ambulatory Visit: Payer: Self-pay | Admitting: Family Medicine

## 2017-07-05 DIAGNOSIS — I10 Essential (primary) hypertension: Secondary | ICD-10-CM

## 2017-07-07 ENCOUNTER — Ambulatory Visit: Payer: Self-pay | Admitting: Family Medicine

## 2017-07-11 DIAGNOSIS — H401131 Primary open-angle glaucoma, bilateral, mild stage: Secondary | ICD-10-CM | POA: Diagnosis not present

## 2017-07-11 LAB — HM DIABETES EYE EXAM

## 2017-07-15 ENCOUNTER — Other Ambulatory Visit: Payer: Self-pay | Admitting: Family Medicine

## 2017-07-15 DIAGNOSIS — J301 Allergic rhinitis due to pollen: Secondary | ICD-10-CM

## 2017-07-18 DIAGNOSIS — L814 Other melanin hyperpigmentation: Secondary | ICD-10-CM | POA: Diagnosis not present

## 2017-07-18 DIAGNOSIS — L570 Actinic keratosis: Secondary | ICD-10-CM | POA: Diagnosis not present

## 2017-07-21 ENCOUNTER — Encounter: Payer: Self-pay | Admitting: Family Medicine

## 2017-07-21 ENCOUNTER — Ambulatory Visit (INDEPENDENT_AMBULATORY_CARE_PROVIDER_SITE_OTHER): Payer: Medicare Other | Admitting: Family Medicine

## 2017-07-21 VITALS — BP 122/60 | HR 64 | Temp 98.0°F | Resp 16 | Ht 67.0 in | Wt 178.0 lb

## 2017-07-21 DIAGNOSIS — E119 Type 2 diabetes mellitus without complications: Secondary | ICD-10-CM

## 2017-07-21 DIAGNOSIS — J302 Other seasonal allergic rhinitis: Secondary | ICD-10-CM

## 2017-07-21 DIAGNOSIS — I10 Essential (primary) hypertension: Secondary | ICD-10-CM | POA: Diagnosis not present

## 2017-07-21 DIAGNOSIS — E7800 Pure hypercholesterolemia, unspecified: Secondary | ICD-10-CM

## 2017-07-21 LAB — POCT GLYCOSYLATED HEMOGLOBIN (HGB A1C)
Est. average glucose Bld gHb Est-mCnc: 174
Hemoglobin A1C: 7.7 % — AB (ref 4.0–5.6)

## 2017-07-21 MED ORDER — CETIRIZINE HCL 10 MG PO TABS: 10.0000 mg | ORAL_TABLET | Freq: Every day | ORAL | 11 refills | Status: DC

## 2017-07-21 NOTE — Progress Notes (Signed)
Patient: Carl Dominguez Male    DOB: 1946/01/24   72 y.o.   MRN: [id number] Visit Date: 07/21/2017  Today's Provider: [name], MD   Chief Complaint  Patient presents with  . Diabetes  . Hypertension   Subjective:    HPI  Diabetes Mellitus Type II, Follow-up:   Lab Results  Component Value Date   HGBA1C 7.7 (A) 07/21/2017   HGBA1C 7.5 (H) 03/10/2017   HGBA1C 7.1 11/15/2016    Last seen for diabetes 4 months ago.  Management since then includes no changes. He reports good compliance with treatment. He is not having side effects.  Current symptoms include none and have been stable. Home blood sugar records: trend: stable  Episodes of hypoglycemia? no   Current Insulin Regimen: none Most Recent Eye Exam: due Weight trend: stable Prior visit with dietician: no Current diet: well balanced Current exercise: no regular exercise  Pertinent Labs:    Component Value Date/Time   CHOL 123 03/10/2017 0919   TRIG 127 03/10/2017 0919   HDL 44 03/10/2017 0919   LDLCALC 54 03/10/2017 0919   CREATININE 1.05 03/10/2017 0919    Wt Readings from Last 3 Encounters:  07/21/17 178 lb (80.7 kg)  03/10/17 179 lb (81.2 kg)  03/04/17 184 lb (83.5 kg)  Allergies are worse.  Hypertension, follow-up:  BP Readings from Last 3 Encounters:  07/21/17 122/60  03/10/17 (!) 118/52  03/04/17 122/68    He was last seen for hypertension 4 months ago.  BP at that visit was 118/52. Management since that visit includes no changes. He reports good compliance with treatment. He is not having side effects.  He is not exercising. He is adherent to low salt diet.   Outside blood pressures are checked occasionally. He is experiencing none.  Patient denies exertional chest pressure/discomfort, lower extremity edema and palpitations.   Cardiovascular risk factors include diabetes mellitus.     No Known Allergies   Current Outpatient Medications:  .  aspirin 81 MG  tablet, Take 81 mg by mouth daily. , Disp: , Rfl:  .  atorvastatin (LIPITOR) 40 MG tablet, Take 1 tablet (40 mg total) by mouth at bedtime., Disp: 90 tablet, Rfl: 3 .  cholecalciferol (VITAMIN D) 1000 UNITS tablet, Take 1,000 Units by mouth daily. , Disp: , Rfl:  .  CHOLESTYRAMINE PO, Take by mouth. Takes once daily, Disp: , Rfl:  .  famotidine (PEPCID) 20 MG tablet, Take 20 mg by mouth daily. , Disp: , Rfl:  .  fluticasone (FLONASE) 50 MCG/ACT nasal spray, Place 2 sprays into both nostrils daily., Disp: 16 g, Rfl: 12 .  glipiZIDE (GLUCOTROL) 10 MG tablet, TAKE 1 TABLET DAILY, Disp: 90 tablet, Rfl: 3 .  glucose blood (FREESTYLE TEST STRIPS) test strip, Check sugar once daily DX E11.9, Disp: 90 each, Rfl: 3 .  ibuprofen (ADVIL,MOTRIN) 200 MG tablet, Take 200 mg by mouth every 6 (six) hours as needed. , Disp: , Rfl:  .  latanoprost (XALATAN) 0.005 % ophthalmic solution, Place 1 drop into both eyes at bedtime. , Disp: , Rfl:  .  loratadine (CLARITIN) 10 MG tablet, take 1 tablet by mouth once daily, Disp: 30 tablet, Rfl: 11 .  losartan (COZAAR) 50 MG tablet, take 1 tablet by mouth once daily, Disp: 90 tablet, Rfl: 4 .  metoprolol succinate (TOPROL-XL) 25 MG 24 hr tablet, TAKE 1 TABLET DAILY, Disp: 90 tablet, Rfl: 3 .  metoprolol succinate (TOPROL-XL) 25  MG 24 hr tablet, TAKE 1 TABLET DAILY, Disp: 90 tablet, Rfl: 3 .  montelukast (SINGULAIR) 10 MG tablet, take 1 tablet by mouth at bedtime, Disp: 30 tablet, Rfl: 11 .  pioglitazone (ACTOS) 45 MG tablet, TAKE 1 TABLET DAILY, Disp: 90 tablet, Rfl: 3 .  psyllium (METAMUCIL SMOOTH TEXTURE) 28 % packet, Take 1 packet by mouth 2 (two) times daily. , Disp: , Rfl:  .  vitamin C (ASCORBIC ACID) 500 MG tablet, Take 500 mg by mouth daily. , Disp: , Rfl:   Review of Systems  Constitutional: Negative for activity change, appetite change, chills, diaphoresis, fatigue, fever and unexpected weight change.  HENT: Positive for congestion, postnasal drip and sinus  pressure.   Respiratory: Negative for apnea, cough, choking, chest tightness, shortness of breath, wheezing and stridor.   Cardiovascular: Negative for chest pain, palpitations and leg swelling.  Gastrointestinal: Negative.   Endocrine: Negative for cold intolerance, heat intolerance, polydipsia, polyphagia and polyuria.  Musculoskeletal: Negative.   Allergic/Immunologic: Negative.   Neurological: Negative for dizziness, tremors, syncope, weakness, numbness and headaches.  Psychiatric/Behavioral: Negative.     Social History   Tobacco Use  . Smoking status: Former Smoker    Types: Cigarettes    Last attempt to quit: 03/01/1982    Years since quitting: 35.4  . Smokeless tobacco: Never Used  . Tobacco comment: has been quit for 30 years  Substance Use Topics  . Alcohol use: Yes    Comment: occasionally- beer   Objective:   BP 122/60 (BP Location: Left Arm, Patient Position: Sitting, Cuff Size: Normal)   Pulse 64   Temp 98 F (36.7 C)   Resp 16   Ht 5\' 7"  (1.702 m)   Wt 178 lb (80.7 kg)   SpO2 98%   BMI 27.88 kg/m  Vitals:   07/21/17 1422  BP: 122/60  Pulse: 64  Resp: 16  Temp: 98 F (36.7 C)  SpO2: 98%  Weight: 178 lb (80.7 kg)  Height: 5\' 7"  (1.702 m)     Physical Exam  Constitutional: He is oriented to person, place, and time. He appears well-developed and well-nourished.  HENT:  Head: Normocephalic and atraumatic.  Eyes: Conjunctivae are normal. No scleral icterus.  Neck: No thyromegaly present.  Cardiovascular: Normal rate, regular rhythm and normal heart sounds.  Pulmonary/Chest: Effort normal and breath sounds normal.  Abdominal: Soft.  Musculoskeletal: He exhibits no edema.  Neurological: He is alert and oriented to person, place, and time.  Skin: Skin is warm and dry.  Psychiatric: He has a normal mood and affect. His behavior is normal. Judgment and thought content normal.        Assessment & Plan:     1. Type 2 diabetes mellitus without  complication, without long-term current use of insulin (H[facility])  - POCT glycosylated hemoglobin (Hb A1C)--7.7 today  2. Essential (primary) hypertension   3. Seasonal allergic rhinitis, unspecified trigger Stop claritin.Try Zyrtec. - cetirizine (ZYRTEC) 10 MG tablet; Take 1 tablet (10 mg total) by mouth daily.  Dispense: 30 tablet; Refill: 11 4.CAD 5.HLD      I have done the exam and reviewed the above chart and it is accurate to the best of my knowledge. Development worker, community has been used in this note in any air is in the dictation or transcription are unintentional.  [name], MD  [facility]

## 2017-09-09 ENCOUNTER — Other Ambulatory Visit: Payer: Self-pay | Admitting: Family Medicine

## 2017-09-09 DIAGNOSIS — E119 Type 2 diabetes mellitus without complications: Secondary | ICD-10-CM

## 2017-10-24 ENCOUNTER — Other Ambulatory Visit: Payer: Self-pay | Admitting: Family Medicine

## 2017-10-24 DIAGNOSIS — E119 Type 2 diabetes mellitus without complications: Secondary | ICD-10-CM

## 2017-10-24 MED ORDER — FREESTYLE LANCETS MISC: 12 refills | Status: DC

## 2017-10-24 NOTE — Telephone Encounter (Signed)
Pt contacted office for refill request on the following medications:  glucose blood (FREESTYLE TEST STRIPS) LANCETS  [facility]  Pt stated that he has been requesting his Lancets from [place] for the last 3 weeks and he only has one left. Pt is requesting an Rx be sent in today if possible. Please advise. Thanks TNP

## 2017-10-24 NOTE — Telephone Encounter (Signed)
Sent into the pharmacy.  

## 2017-10-24 NOTE — Telephone Encounter (Signed)
[facility] faxed a refill request for the following.  Thanks CC  glucose blood (FREESTYLE TEST STRIPS) test strip

## 2017-11-21 DIAGNOSIS — D1801 Hemangioma of skin and subcutaneous tissue: Secondary | ICD-10-CM | POA: Diagnosis not present

## 2017-11-21 DIAGNOSIS — L570 Actinic keratosis: Secondary | ICD-10-CM | POA: Diagnosis not present

## 2017-11-23 ENCOUNTER — Encounter: Payer: Self-pay | Admitting: Family Medicine

## 2017-11-23 ENCOUNTER — Ambulatory Visit (INDEPENDENT_AMBULATORY_CARE_PROVIDER_SITE_OTHER): Payer: Medicare Other | Admitting: Family Medicine

## 2017-11-23 VITALS — BP 104/56 | HR 63 | Temp 97.8°F | Resp 16 | Wt 177.2 lb

## 2017-11-23 DIAGNOSIS — E7800 Pure hypercholesterolemia, unspecified: Secondary | ICD-10-CM

## 2017-11-23 DIAGNOSIS — I10 Essential (primary) hypertension: Secondary | ICD-10-CM | POA: Diagnosis not present

## 2017-11-23 DIAGNOSIS — I2510 Atherosclerotic heart disease of native coronary artery without angina pectoris: Secondary | ICD-10-CM | POA: Diagnosis not present

## 2017-11-23 DIAGNOSIS — E119 Type 2 diabetes mellitus without complications: Secondary | ICD-10-CM | POA: Diagnosis not present

## 2017-11-23 LAB — POCT UA - MICROALBUMIN: Microalbumin Ur, POC: 20 mg/L

## 2017-11-23 LAB — POCT GLYCOSYLATED HEMOGLOBIN (HGB A1C): Hemoglobin A1C: 6.9 % — AB (ref 4.0–5.6)

## 2017-11-23 NOTE — Progress Notes (Signed)
Patient: Carl Dominguez Male    DOB: 1945/06/01   72 y.o.   MRN: [id number] Visit Date: 11/23/2017  Today's Provider: [name], MD   Chief Complaint  Patient presents with  . Coronary Artery Disease    4 month follow up  . Diabetes  . Hypertension  . Hyperlipidemia   Subjective:    HPI      Diabetes Mellitus Type II, Follow-up:   Lab Results  Component Value Date   HGBA1C 7.7 (A) 07/21/2017   HGBA1C 7.5 (H) 03/10/2017   HGBA1C 7.1 11/15/2016   Last seen for diabetes 4 months ago.  Management since then includes none. He reports excellent compliance with treatment. He is not having side effects.  Current symptoms include none and have been . Home blood sugar records: fasting range: 138-167  Episodes of hypoglycemia? no   Current Insulin Regimen: N/A Most Recent Eye Exam: 6 months ago Weight trend: stable Prior visit with dietician:  Current diet: in general, a "healthy" diet   Current exercise: gardening  ------------------------------------------------------------------------   Hypertension, follow-up:  BP Readings from Last 3 Encounters:  11/23/17 (!) 104/56  07/21/17 122/60  03/10/17 (!) 118/52    He was last seen for hypertension 4 months ago.  BP at that visit was 122/60. Management since that visit includes .He reports excellent compliance with treatment. He is not having side effects.  He is not exercising. He is adherent to low salt diet.   Outside blood pressures are not being checked. He is experiencing none.  Patient denies chest pain, chest pressure/discomfort, claudication, dyspnea, exertional chest pressure/discomfort, fatigue, irregular heart beat, lower extremity edema, near-syncope, orthopnea, palpitations, paroxysmal nocturnal dyspnea, syncope and tachypnea.   Cardiovascular risk factors include advanced age (older than 22 for men, 78 for women), diabetes mellitus and male gender.  Use of agents associated with  hypertension: NSAIDS.   ------------------------------------------------------------------------    Lipid/Cholesterol, Follow-up:   Last seen for this 4 months ago.  Management since that visit includes none.  Last Lipid Panel:    Component Value Date/Time   CHOL 123 03/10/2017 0919   TRIG 127 03/10/2017 0919   HDL 44 03/10/2017 0919   LDLCALC 54 03/10/2017 0919    He reports excellent compliance with treatment. He is not having side effects.   Wt Readings from Last 3 Encounters:  11/23/17 177 lb 3.2 oz (80.4 kg)  07/21/17 178 lb (80.7 kg)  03/10/17 179 lb (81.2 kg)    ------------------------------------------------------------------------   No Known Allergies   Current Outpatient Medications:  .  aspirin 81 MG tablet, Take 81 mg by mouth daily. , Disp: , Rfl:  .  atorvastatin (LIPITOR) 40 MG tablet, TAKE 1 TABLET AT BEDTIME, Disp: 90 tablet, Rfl: 3 .  cetirizine (ZYRTEC) 10 MG tablet, Take 1 tablet (10 mg total) by mouth daily., Disp: 30 tablet, Rfl: 11 .  cholecalciferol (VITAMIN D) 1000 UNITS tablet, Take 1,000 Units by mouth daily. , Disp: , Rfl:  .  cholestyramine (QUESTRAN) 4 GM/DOSE powder, Take one 4 gram scoop daily.  No other medication 4 hours before or after., Disp: , Rfl:  .  CHOLESTYRAMINE PO, Take by mouth. Takes once daily, Disp: , Rfl:  .  famotidine (PEPCID) 20 MG tablet, Take 20 mg by mouth daily. , Disp: , Rfl:  .  fluticasone (FLONASE) 50 MCG/ACT nasal spray, Place 2 sprays into both nostrils daily., Disp: 16 g, Rfl: 12 .  glipiZIDE (GLUCOTROL)  10 MG tablet, TAKE 1 TABLET DAILY, Disp: 90 tablet, Rfl: 3 .  glucose blood (FREESTYLE TEST STRIPS) test strip, Check sugar once daily DX E11.9, Disp: 90 each, Rfl: 3 .  ibuprofen (ADVIL,MOTRIN) 200 MG tablet, Take 200 mg by mouth every 6 (six) hours as needed. , Disp: , Rfl:  .  Lancets (FREESTYLE) lancets, Use as instructed, Disp: 100 each, Rfl: 12 .  latanoprost (XALATAN) 0.005 % ophthalmic solution,  Place 1 drop into both eyes at bedtime. , Disp: , Rfl:  .  losartan (COZAAR) 50 MG tablet, take 1 tablet by mouth once daily, Disp: 90 tablet, Rfl: 4 .  metoprolol succinate (TOPROL-XL) 25 MG 24 hr tablet, TAKE 1 TABLET DAILY, Disp: 90 tablet, Rfl: 3 .  montelukast (SINGULAIR) 10 MG tablet, take 1 tablet by mouth at bedtime, Disp: 30 tablet, Rfl: 11 .  pioglitazone (ACTOS) 45 MG tablet, TAKE 1 TABLET DAILY, Disp: 90 tablet, Rfl: 3 .  vitamin C (ASCORBIC ACID) 500 MG tablet, Take 500 mg by mouth daily. , Disp: , Rfl:  .  psyllium (METAMUCIL SMOOTH TEXTURE) 28 % packet, Take 1 packet by mouth 2 (two) times daily. , Disp: , Rfl:   Review of Systems  Constitutional: Negative.   HENT: Negative.   Eyes: Negative.   Respiratory: Negative.   Cardiovascular: Negative.   Endocrine: Negative.   Allergic/Immunologic: Negative.   Psychiatric/Behavioral: Negative.     Social History   Tobacco Use  . Smoking status: Former Smoker    Types: Cigarettes    Last attempt to quit: 03/01/1982    Years since quitting: 35.7  . Smokeless tobacco: Never Used  . Tobacco comment: has been quit for 30 years  Substance Use Topics  . Alcohol use: Yes    Comment: occasionally- beer   Objective:   BP (!) 104/56   Pulse 63   Temp 97.8 F (36.6 C) (Oral)   Resp 16   Wt 177 lb 3.2 oz (80.4 kg)   SpO2 98%   BMI 27.75 kg/m  Vitals:   11/23/17 0825  BP: (!) 104/56  Pulse: 63  Resp: 16  Temp: 97.8 F (36.6 C)  TempSrc: Oral  SpO2: 98%  Weight: 177 lb 3.2 oz (80.4 kg)     Physical Exam  Constitutional: He is oriented to person, place, and time. He appears well-developed and well-nourished.  HENT:  Head: Normocephalic and atraumatic.  Right Ear: External ear normal.  Left Ear: External ear normal.  Nose: Nose normal.  Eyes: Conjunctivae are normal. No scleral icterus.  Neck: No thyromegaly present.  Cardiovascular: Normal rate, regular rhythm and normal heart sounds.  Pulmonary/Chest: Effort  normal and breath sounds normal.  Abdominal: Soft.  Musculoskeletal: He exhibits no edema.  Neurological: He is alert and oriented to person, place, and time.  Skin: Skin is warm and dry.  Psychiatric: He has a normal mood and affect. His behavior is normal. Judgment and thought content normal.        Assessment & Plan:      1. Type 2 diabetes mellitus without complication, without long-term current use of insulin (HCC)  - POCT glycosylated hemoglobin (Hb A1C)6.9 today - POCT UA - Microalbumin  2. CAD in native artery   3. Essential (primary) hypertension   4. Hypercholesterolemia without hypertriglyceridemia       I have done the exam and reviewed the above chart and it is accurate to the best of my knowledge. Development worker, community has been used  in this note in any air is in the dictation or transcription are unintentional.  [name], MD  [facility]

## 2017-12-06 DIAGNOSIS — Z23 Encounter for immunization: Secondary | ICD-10-CM | POA: Diagnosis not present

## 2017-12-13 ENCOUNTER — Other Ambulatory Visit: Payer: Self-pay | Admitting: Family Medicine

## 2018-01-09 DIAGNOSIS — H401131 Primary open-angle glaucoma, bilateral, mild stage: Secondary | ICD-10-CM | POA: Diagnosis not present

## 2018-03-22 ENCOUNTER — Ambulatory Visit (INDEPENDENT_AMBULATORY_CARE_PROVIDER_SITE_OTHER): Payer: Medicare Other

## 2018-03-22 VITALS — BP 108/58 | HR 57 | Temp 97.6°F | Ht 67.0 in | Wt 180.6 lb

## 2018-03-22 DIAGNOSIS — Z0000 Encounter for general adult medical examination without abnormal findings: Secondary | ICD-10-CM | POA: Diagnosis not present

## 2018-03-22 NOTE — Progress Notes (Signed)
Subjective:   Carl Dominguez is a 73 y.o. male who presents for Medicare Annual/Subsequent preventive examination.  Review of Systems:  N/A  Cardiac Risk Factors include: advanced age (>59men, >52 women);diabetes mellitus;dyslipidemia;hypertension;male gender     Objective:    Vitals: BP (!) 108/58 (BP Location: Right Arm)   Pulse (!) 57   Temp 97.6 F (36.4 C) (Oral)   Ht 5\' 7"  (1.702 m)   Wt 180 lb 9.6 oz (81.9 kg)   BMI 28.29 kg/m   Body mass index is 28.29 kg/m.  Advanced Directives 03/22/2018 03/04/2017 03/24/2016  Does Patient Have a Medical Advance Directive? No Yes Yes  Type of Advance Directive - Living will Living will  Does patient want to make changes to medical advance directive? - - No - Patient declined  Would patient like information on creating a medical advance directive? No - Patient declined - -    Tobacco Social History   Tobacco Use  Smoking Status Former Smoker  . Types: Cigarettes  . Last attempt to quit: 03/01/1982  . Years since quitting: 36.0  Smokeless Tobacco Never Used  Tobacco Comment   has been quit for 30 years     Counseling given: Not Answered Comment: has been quit for 30 years   Clinical Intake:  Pre-visit preparation completed: Yes  Pain : No/denies pain Pain Score: 0-No pain    Diabetes:  Is the patient diabetic?  Yes type 2 If diabetic, was a CBG obtained today?  No  Did the patient bring in their glucometer from home?  No  How often do you monitor your CBG's? Once in the AM.   Financial Strains and Diabetes Management:  Are you having any financial strains with the device, your supplies or your medication? No .  Does the patient want to be seen by Chronic Care Management for management of their diabetes?  No  Would the patient like to be referred to a Nutritionist or for Diabetic Management?  No   Diabetic Exams:  Diabetic Eye Exam: Completed 07/11/17.   Diabetic Foot Exam: Completed 11/15/16. Pt has been  advised about the importance in completing this exam. Note made to f/u on this at CPE.    Nutritional Status: BMI 25 -29 Overweight Nutritional Risks: None   How often do you need to have someone help you when you read instructions, pamphlets, or other written materials from your doctor or pharmacy?: 1 - Never  Interpreter Needed?: No  Information entered by :: [facility], LPN  Past Medical History:  Diagnosis Date  . Diabetes mellitus without complication ([place])   . Heart disease   . Hyperlipidemia   . Hypertension   . Myocardial infarction ([place]) 1999   Past Surgical History:  Procedure Laterality Date  . CATARACT EXTRACTION W/PHACO Right 03/24/2016   Procedure: CATARACT EXTRACTION PHACO AND INTRAOCULAR LENS PLACEMENT ([place])  Right;  Surgeon: Leandrew Koyanagi, MD;  Location: [facility];  Service: Ophthalmology;  Laterality: Right;  Diabetic - oral meds right eye  . CHOLECYSTECTOMY  2010  . CORONARY ANGIOPLASTY WITH STENT PLACEMENT  1999  . HERNIA REPAIR     History reviewed. No pertinent family history. Social History   Socioeconomic History  . Marital status: Married    Spouse name: Not on file  . Number of children: 2  . Years of education: Not on file  . Highest education level: Some college, no degree  Occupational History  . Occupation: retired  Scientific laboratory technician  .  Financial resource strain: Not hard at all  . Food insecurity:    Worry: Never true    Inability: Never true  . Transportation needs:    Medical: No    Non-medical: No  Tobacco Use  . Smoking status: Former Smoker    Types: Cigarettes    Last attempt to quit: 03/01/1982    Years since quitting: 36.0  . Smokeless tobacco: Never Used  . Tobacco comment: has been quit for 30 years  Substance and Sexual Activity  . Alcohol use: Yes    Comment: occasionally- 1 beer  . Drug use: No  . Sexual activity: Not on file  Lifestyle  . Physical activity:    Days per week: 0 days    Minutes per  session: 0 min  . Stress: Not at all  Relationships  . Social connections:    Talks on phone: Patient refused    Gets together: Patient refused    Attends religious service: Patient refused    Active member of club or organization: Patient refused    Attends meetings of clubs or organizations: Patient refused    Relationship status: Patient refused  Other Topics Concern  . Not on file  Social History Narrative  . Not on file    Outpatient Encounter Medications as of 03/22/2018  Medication Sig  . aspirin 81 MG tablet Take 81 mg by mouth daily.   Marland Kitchen atorvastatin (LIPITOR) 40 MG tablet TAKE 1 TABLET AT BEDTIME  . cetirizine (ZYRTEC) 10 MG tablet Take 1 tablet (10 mg total) by mouth daily.  . cholecalciferol (VITAMIN D) 1000 UNITS tablet Take 1,000 Units by mouth daily.   . cholestyramine (QUESTRAN) 4 GM/DOSE powder Take one 4 gram scoop daily.  No other medication 4 hours before or after.  . CHOLESTYRAMINE PO Take by mouth. Takes once daily  . famotidine (PEPCID) 20 MG tablet Take 20 mg by mouth daily.   . fluticasone (FLONASE) 50 MCG/ACT nasal spray Place 2 sprays into both nostrils daily.  Marland Kitchen glipiZIDE (GLUCOTROL) 10 MG tablet TAKE 1 TABLET DAILY  . glucose blood (FREESTYLE TEST STRIPS) test strip Check sugar once daily DX E11.9  . ibuprofen (ADVIL,MOTRIN) 200 MG tablet Take 200 mg by mouth every 6 (six) hours as needed.   . Lancets (FREESTYLE) lancets Use as instructed  . latanoprost (XALATAN) 0.005 % ophthalmic solution Place 1 drop into both eyes at bedtime.   Marland Kitchen losartan (COZAAR) 50 MG tablet TAKE 1 TABLET BY MOUTH EVERY DAY  . metoprolol succinate (TOPROL-XL) 25 MG 24 hr tablet TAKE 1 TABLET DAILY  . montelukast (SINGULAIR) 10 MG tablet take 1 tablet by mouth at bedtime  . pioglitazone (ACTOS) 45 MG tablet TAKE 1 TABLET DAILY  . psyllium (METAMUCIL SMOOTH TEXTURE) 28 % packet Take 1 packet by mouth 2 (two) times daily.   . vitamin C (ASCORBIC ACID) 500 MG tablet Take 500 mg by  mouth daily.    No facility-administered encounter medications on file as of 03/22/2018.     Activities of Daily Living In your present state of health, do you have any difficulty performing the following activities: 03/22/2018  Hearing? Y  Comment Wears bilateral hearing aids.   Vision? N  Comment Wears eye glasses.   Difficulty concentrating or making decisions? Y  Walking or climbing stairs? N  Dressing or bathing? N  Doing errands, shopping? N  Preparing Food and eating ? N  Using the Toilet? N  In the past six months, have  you accidently leaked urine? N  Do you have problems with loss of bowel control? N  Managing your Medications? N  Managing your Finances? N  Housekeeping or managing your Housekeeping? N  Some recent data might be hidden    Patient Care Team: Jerrol Banana., MD as PCP - General (Family Medicine) Leandrew Koyanagi, MD as Referring Physician (Ophthalmology) Richmond Campbell, MD as Consulting Physician (Gastroenterology)   Assessment:   This is a routine wellness examination for Egidio.  Exercise Activities and Dietary recommendations Current Exercise Habits: The patient does not participate in regular exercise at present, Exercise limited by: None identified  Goals    . DIET - INCREASE WATER INTAKE     Recommend increasing water intake to 4 glasses a day.     . Exercise 3x per week (30 min per time)     Recommend to exercise for 3 days a week for at least 30 minutes at a time.         Fall Risk Fall Risk  03/22/2018 03/04/2017 11/15/2016 11/05/2015 03/24/2015  Falls in the past year? 0 No No No No   FALL RISK PREVENTION PERTAINING TO THE HOME:  Any stairs in or around the home WITH handrails? No  Home free of loose throw rugs in walkways, pet beds, electrical cords, etc? Yes  Adequate lighting in your home to reduce risk of falls? Yes   ASSISTIVE DEVICES UTILIZED TO PREVENT FALLS:  Life alert? No  Use of a cane, walker or w/c? No  Grab  bars in the bathroom? No  Shower chair or bench in shower? No  Elevated toilet seat or a handicapped toilet? No    TIMED UP AND GO:  Was the test performed? No .     Depression Screen PHQ 2/9 Scores 03/22/2018 03/04/2017 11/15/2016 11/05/2015  PHQ - 2 Score 0 0 0 0    Cognitive Function     6CIT Screen 03/22/2018 03/04/2017  What Year? 0 points 0 points  What month? 0 points 0 points  What time? 0 points 0 points  Count back from 20 0 points 0 points  Months in reverse 0 points 0 points  Repeat phrase 0 points 2 points  Total Score 0 2    Immunization History  Administered Date(s) Administered  . Influenza, High Dose Seasonal PF 12/01/2015, 12/03/2016, 12/06/2017  . Influenza-Unspecified 11/30/2014  . Pneumococcal Conjugate-13 12/07/2012, 03/14/2014  . Pneumococcal Polysaccharide-23 02/03/2005, 12/13/2008  . Tdap 12/06/2007  . Zoster 12/31/2010  . Zoster Recombinat (Shingrix) 12/03/2016, 04/14/2017    Qualifies for Shingles Vaccine? Up to date  Tdap: Although this vaccine is not a covered service during a Wellness Exam, does the patient still wish to receive this vaccine today?  No .  Education has been provided regarding the importance of this vaccine. Advised may receive this vaccine at local pharmacy or Health Dept. Aware to provide a copy of the vaccination record if obtained from local pharmacy or Health Dept. Verbalized acceptance and understanding.  Flu Vaccine: Up to date  Pneumococcal Vaccine: Up to date   Screening Tests Health Maintenance  Topic Date Due  . FOOT EXAM  11/15/2017  . TETANUS/TDAP  12/05/2017  . HEMOGLOBIN A1C  05/24/2018  . OPHTHALMOLOGY EXAM  07/12/2018  . COLONOSCOPY  04/04/2022  . INFLUENZA VACCINE  Completed  . Hepatitis C Screening  Completed  . PNA vac Low Risk Adult  Completed   Cancer Screenings:  Colorectal Screening: Completed 04/15/17. Repeat every 5  years.  Lung Cancer Screening: (Low Dose CT Chest recommended if Age 45-80  years, 30 pack-year currently smoking OR have quit w/in 15years.) does not qualify.    Additional Screening:  Hepatitis C Screening: Up to date  Vision Screening: Recommended annual ophthalmology exams for early detection of glaucoma and other disorders of the eye.  Dental Screening: Recommended annual dental exams for proper oral hygiene  Community Resource Referral:  CRR required this visit?  No        Plan:  I have personally reviewed and addressed the Medicare Annual Wellness questionnaire and have noted the following in the patient's chart:  A. Medical and social history B. Use of alcohol, tobacco or illicit drugs  C. Current medications and supplements D. Functional ability and status E.  Nutritional status F.  Physical activity G. Advance directives H. List of other physicians I.  Hospitalizations, surgeries, and ER visits in previous 12 months J.  [place] such as hearing and vision if needed, cognitive and depression L. Referrals and appointments - none  In addition, I have reviewed and discussed with patient certain preventive protocols, quality metrics, and best practice recommendations. A written personalized care plan for preventive services as well as general preventive health recommendations were provided to patient.  See attached scanned questionnaire for additional information.   Signed,  Fabio Neighbors, LPN Nurse Health Advisor   Nurse Recommendations: Pt needs a diabetic foot exam at next OV. Pt declined the tetanus vaccine today.

## 2018-03-22 NOTE — Patient Instructions (Signed)
[name] , Thank you for taking time to come for your Medicare Wellness Visit. I appreciate your ongoing commitment to your health goals. Please review the following plan we discussed and let me know if I can assist you in the future.   Screening recommendations/referrals: Colonoscopy: Up to date, due 04/2022 Recommended yearly ophthalmology/optometry visit for glaucoma screening and checkup Recommended yearly dental visit for hygiene and checkup  Vaccinations: Influenza vaccine: Up to date Pneumococcal vaccine: Completed series Tdap vaccine: Pt declines today.  Shingles vaccine: Completed series    Advanced directives: Advance directive discussed with you today. Even though you declined this today please call our office should you change your mind and we can give you the proper paperwork for you to fill out.  Conditions/risks identified: Recommend to exercise for 3 days a week for at least 30 minutes at a time.   Next appointment: 03/27/18 @ 10:00 AM with Dr Rosanna Randy.   Preventive Care 75 Years and Older, Male Preventive care refers to lifestyle choices and visits with your health care provider that can promote health and wellness. What does preventive care include?  A yearly physical exam. This is also called an annual well check.  Dental exams once or twice a year.  Routine eye exams. Ask your health care provider how often you should have your eyes checked.  Personal lifestyle choices, including:  Daily care of your teeth and gums.  Regular physical activity.  Eating a healthy diet.  Avoiding tobacco and drug use.  Limiting alcohol use.  Practicing safe sex.  Taking low doses of aspirin every day.  Taking vitamin and mineral supplements as recommended by your health care provider. What happens during an annual well check? The services and screenings done by your health care provider during your annual well check will depend on your age, overall health, lifestyle  risk factors, and family history of disease. Counseling  Your health care provider may ask you questions about your:  Alcohol use.  Tobacco use.  Drug use.  Emotional well-being.  Home and relationship well-being.  Sexual activity.  Eating habits.  History of falls.  Memory and ability to understand (cognition).  Work and work Statistician. Screening  You may have the following tests or measurements:  Height, weight, and BMI.  Blood pressure.  Lipid and cholesterol levels. These may be checked every 5 years, or more frequently if you are over 75 years old.  Skin check.  Lung cancer screening. You may have this screening every year starting at age 75 if you have a 30-pack-year history of smoking and currently smoke or have quit within the past 15 years.  Fecal occult blood test (FOBT) of the stool. You may have this test every year starting at age 65.  Flexible sigmoidoscopy or colonoscopy. You may have a sigmoidoscopy every 5 years or a colonoscopy every 10 years starting at age 75.  Prostate cancer screening. Recommendations will vary depending on your family history and other risks.  Hepatitis C blood test.  Hepatitis B blood test.  Sexually transmitted disease (STD) testing.  Diabetes screening. This is done by checking your blood sugar (glucose) after you have not eaten for a while (fasting). You may have this done every 1-3 years.  Abdominal aortic aneurysm (AAA) screening. You may need this if you are a current or former smoker.  Osteoporosis. You may be screened starting at age 75 if you are at high risk. Talk with your health care provider about your test  results, treatment options, and if necessary, the need for more tests. Vaccines  Your health care provider may recommend certain vaccines, such as:  Influenza vaccine. This is recommended every year.  Tetanus, diphtheria, and acellular pertussis (Tdap, Td) vaccine. You may need a Td booster every 10  years.  Zoster vaccine. You may need this after age 75.  Pneumococcal 13-valent conjugate (PCV13) vaccine. One dose is recommended after age 15.  Pneumococcal polysaccharide (PPSV23) vaccine. One dose is recommended after age 82. Talk to your health care provider about which screenings and vaccines you need and how often you need them. This information is not intended to replace advice given to you by your health care provider. Make sure you discuss any questions you have with your health care provider. Document Released: 03/14/2015 Document Revised: 11/05/2015 Document Reviewed: 12/17/2014 Elsevier Interactive Patient Education  2017 [place] Prevention in the Home Falls can cause injuries. They can happen to people of all ages. There are many things you can do to make your home safe and to help prevent falls. What can I do on the outside of my home?  Regularly fix the edges of walkways and driveways and fix any cracks.  Remove anything that might make you trip as you walk through a door, such as a raised step or threshold.  Trim any bushes or trees on the path to your home.  Use bright outdoor lighting.  Clear any walking paths of anything that might make someone trip, such as rocks or tools.  Regularly check to see if handrails are loose or broken. Make sure that both sides of any steps have handrails.  Any raised decks and porches should have guardrails on the edges.  Have any leaves, snow, or ice cleared regularly.  Use sand or salt on walking paths during winter.  Clean up any spills in your garage right away. This includes oil or grease spills. What can I do in the bathroom?  Use night lights.  Install grab bars by the toilet and in the tub and shower. Do not use towel bars as grab bars.  Use non-skid mats or decals in the tub or shower.  If you need to sit down in the shower, use a plastic, non-slip stool.  Keep the floor dry. Clean up any water that  spills on the floor as soon as it happens.  Remove soap buildup in the tub or shower regularly.  Attach bath mats securely with double-sided non-slip rug tape.  Do not have throw rugs and other things on the floor that can make you trip. What can I do in the bedroom?  Use night lights.  Make sure that you have a light by your bed that is easy to reach.  Do not use any sheets or blankets that are too big for your bed. They should not hang down onto the floor.  Have a firm chair that has side arms. You can use this for support while you get dressed.  Do not have throw rugs and other things on the floor that can make you trip. What can I do in the kitchen?  Clean up any spills right away.  Avoid walking on wet floors.  Keep items that you use a lot in easy-to-reach places.  If you need to reach something above you, use a strong step stool that has a grab bar.  Keep electrical cords out of the way.  Do not use floor polish or wax that makes  floors slippery. If you must use wax, use non-skid floor wax.  Do not have throw rugs and other things on the floor that can make you trip. What can I do with my stairs?  Do not leave any items on the stairs.  Make sure that there are handrails on both sides of the stairs and use them. Fix handrails that are broken or loose. Make sure that handrails are as long as the stairways.  Check any carpeting to make sure that it is firmly attached to the stairs. Fix any carpet that is loose or worn.  Avoid having throw rugs at the top or bottom of the stairs. If you do have throw rugs, attach them to the floor with carpet tape.  Make sure that you have a light switch at the top of the stairs and the bottom of the stairs. If you do not have them, ask someone to add them for you. What else can I do to help prevent falls?  Wear shoes that:  Do not have high heels.  Have rubber bottoms.  Are comfortable and fit you well.  Are closed at the  toe. Do not wear sandals.  If you use a stepladder:  Make sure that it is fully opened. Do not climb a closed stepladder.  Make sure that both sides of the stepladder are locked into place.  Ask someone to hold it for you, if possible.  Clearly mark and make sure that you can see:  Any grab bars or handrails.  First and last steps.  Where the edge of each step is.  Use tools that help you move around (mobility aids) if they are needed. These include:  Canes.  Walkers.  Scooters.  Crutches.  Turn on the lights when you go into a dark area. Replace any light bulbs as soon as they burn out.  Set up your furniture so you have a clear path. Avoid moving your furniture around.  If any of your floors are uneven, fix them.  If there are any pets around you, be aware of where they are.  Review your medicines with your doctor. Some medicines can make you feel dizzy. This can increase your chance of falling. Ask your doctor what other things that you can do to help prevent falls. This information is not intended to replace advice given to you by your health care provider. Make sure you discuss any questions you have with your health care provider. Document Released: 12/12/2008 Document Revised: 07/24/2015 Document Reviewed: 03/22/2014 Elsevier Interactive Patient Education  2017 [employer].

## 2018-03-27 ENCOUNTER — Ambulatory Visit (INDEPENDENT_AMBULATORY_CARE_PROVIDER_SITE_OTHER): Payer: Medicare Other | Admitting: Family Medicine

## 2018-03-27 VITALS — BP 130/70 | HR 60 | Temp 97.7°F | Resp 16 | Ht 67.0 in | Wt 182.0 lb

## 2018-03-27 DIAGNOSIS — I2510 Atherosclerotic heart disease of native coronary artery without angina pectoris: Secondary | ICD-10-CM | POA: Diagnosis not present

## 2018-03-27 DIAGNOSIS — E119 Type 2 diabetes mellitus without complications: Secondary | ICD-10-CM | POA: Diagnosis not present

## 2018-03-27 DIAGNOSIS — I10 Essential (primary) hypertension: Secondary | ICD-10-CM | POA: Diagnosis not present

## 2018-03-27 DIAGNOSIS — E7800 Pure hypercholesterolemia, unspecified: Secondary | ICD-10-CM | POA: Diagnosis not present

## 2018-03-27 NOTE — Progress Notes (Signed)
Carl Dominguez  MRN: [id number] DOB: [birth year]  Subjective:  HPI   The patient is a 73 year old male who presents for follow up after his Annual Wellness Visit with the [place] on 03/22/18. Patient is married and has 6 grandchildren.  He is retired.  He feels well.  1. Essential (primary) hypertension BP Readings from Last 3 Encounters:  03/27/18 130/70  03/22/18 (!) 108/58  11/23/17 (!) 104/56   2. Type 2 diabetes mellitus without complication, without long-term current use of insulin (HCC) Lab Results  Component Value Date   HGBA1C 6.9 (A) 11/23/2017   3. Hypercholesterolemia without hypertriglyceridemia Lab Results  Component Value Date   CHOL 123 03/10/2017   HDL 44 03/10/2017   LDLCALC 54 03/10/2017   TRIG 127 03/10/2017    Patient Active Problem List   Diagnosis Date Noted  . Actinic keratosis 11/14/2014  . CAD in native artery 11/14/2014  . Cognitive complaints 11/14/2014  . Diabetes ([place]) 11/14/2014  . Essential (primary) hypertension 11/14/2014  . Gastro-esophageal reflux disease without esophagitis 11/14/2014  . Hemorrhage of gastrointestinal tract 11/14/2014  . Internal hemorrhoids 11/14/2014  . Personal history of tobacco use, presenting hazards to health 11/14/2014  . HLD (hyperlipidemia) 11/14/2014  . Allergic state 11/14/2014  . Arthritis of hand, degenerative 11/14/2014  . Allergic rhinitis, seasonal 11/14/2014  . Prostatism 11/14/2014  . Hypercholesterolemia without hypertriglyceridemia 11/14/2014  . Change in blood platelet count 11/14/2014  . Diabetes mellitus, type 2 ([place]) 11/14/2014  . Avitaminosis D 11/14/2014    Past Medical History:  Diagnosis Date  . Diabetes mellitus without complication ([place])   . Heart disease   . Hyperlipidemia   . Hypertension   . Myocardial infarction [facility]) 1999    Social History   Socioeconomic History  . Marital status: Married    Spouse name: Not on file  . Number of children: 2  .  Years of education: Not on file  . Highest education level: Some college, no degree  Occupational History  . Occupation: retired  Scientific laboratory technician  . Financial resource strain: Not hard at all  . Food insecurity:    Worry: Never true    Inability: Never true  . Transportation needs:    Medical: No    Non-medical: No  Tobacco Use  . Smoking status: Former Smoker    Types: Cigarettes    Last attempt to quit: 03/01/1982    Years since quitting: 36.0  . Smokeless tobacco: Never Used  . Tobacco comment: has been quit for 30 years  Substance and Sexual Activity  . Alcohol use: Yes    Comment: occasionally- 1 beer  . Drug use: No  . Sexual activity: Not on file  Lifestyle  . Physical activity:    Days per week: 0 days    Minutes per session: 0 min  . Stress: Not at all  Relationships  . Social connections:    Talks on phone: Patient refused    Gets together: Patient refused    Attends religious service: Patient refused    Active member of club or organization: Patient refused    Attends meetings of clubs or organizations: Patient refused    Relationship status: Patient refused  . Intimate partner violence:    Fear of current or ex partner: No    Emotionally abused: No    Physically abused: No    Forced sexual activity: No  Other Topics Concern  . Not on file  Social History  Narrative  . Not on file    Outpatient Encounter Medications as of 03/27/2018  Medication Sig Note  . aspirin 81 MG tablet Take 81 mg by mouth daily.  11/14/2014: Received from: [facility]  . atorvastatin (LIPITOR) 40 MG tablet TAKE 1 TABLET AT BEDTIME   . cetirizine (ZYRTEC) 10 MG tablet Take 1 tablet (10 mg total) by mouth daily.   . cholecalciferol (VITAMIN D) 1000 UNITS tablet Take 1,000 Units by mouth daily.  11/14/2014: Received from: [facility]  . cholestyramine (QUESTRAN) 4 GM/DOSE powder Take one 4 gram scoop daily.  No other medication 4 hours before or after.     . famotidine (PEPCID) 20 MG tablet Take 20 mg by mouth daily.  11/14/2014: Received from: [facility]  . fluticasone (FLONASE) 50 MCG/ACT nasal spray Place 2 sprays into both nostrils daily.   Marland Kitchen glipiZIDE (GLUCOTROL) 10 MG tablet TAKE 1 TABLET DAILY   . glucose blood (FREESTYLE TEST STRIPS) test strip Check sugar once daily DX E11.9   . ibuprofen (ADVIL,MOTRIN) 200 MG tablet Take 200 mg by mouth every 6 (six) hours as needed.  11/14/2014: Received from: [facility]  . Lancets (FREESTYLE) lancets Use as instructed   . latanoprost (XALATAN) 0.005 % ophthalmic solution Place 1 drop into both eyes at bedtime.  11/14/2014: Received from: [facility]  . losartan (COZAAR) 50 MG tablet TAKE 1 TABLET BY MOUTH EVERY DAY   . metoprolol succinate (TOPROL-XL) 25 MG 24 hr tablet TAKE 1 TABLET DAILY   . montelukast (SINGULAIR) 10 MG tablet take 1 tablet by mouth at bedtime   . pioglitazone (ACTOS) 45 MG tablet TAKE 1 TABLET DAILY   . psyllium (METAMUCIL SMOOTH TEXTURE) 28 % packet Take 1 packet by mouth 2 (two) times daily.  11/14/2014: Received from: [facility]  . vitamin C (ASCORBIC ACID) 500 MG tablet Take 500 mg by mouth daily.    . [DISCONTINUED] CHOLESTYRAMINE PO Take by mouth. Takes once daily 11/14/2014: Received from: [facility]   No facility-administered encounter medications on file as of 03/27/2018.     No Known Allergies  Review of Systems  Constitutional: Negative.   HENT: Negative.   Eyes: Negative.   Respiratory: Negative.   Cardiovascular: Negative.   Gastrointestinal: Negative.   Genitourinary: Negative.   Musculoskeletal: Negative.   Skin: Negative.   Neurological: Negative.   Endo/Heme/Allergies: Negative.   Psychiatric/Behavioral: Negative.     Objective:  BP 130/70 (BP Location: Right Arm, Patient Position: Sitting, Cuff Size: Large)   Pulse 60   Temp 97.7 F (36.5 C) (Oral)   Resp 16    Ht 5\' 7"  (1.702 m)   Wt 182 lb (82.6 kg)   BMI 28.51 kg/m   Physical Exam  Constitutional: He is oriented to person, place, and time and well-developed, well-nourished, and in no distress.  HENT:  Head: Normocephalic and atraumatic.  Right Ear: External ear normal.  Left Ear: External ear normal.  Nose: Nose normal.  Mouth/Throat: Oropharynx is clear and moist.  Eyes: Pupils are equal, round, and reactive to light. Conjunctivae are normal. No scleral icterus.  Neck: Normal range of motion. Neck supple. No thyromegaly present.  Cardiovascular: Normal rate, regular rhythm and intact distal pulses. Exam reveals no gallop.  Murmur heard. Pulmonary/Chest: Effort normal and breath sounds normal. No respiratory distress. He has no wheezes.  Abdominal: Soft.  Musculoskeletal:        General: No tenderness or edema.  Neurological: He is alert and oriented to person, place, and time.  Skin: Skin is warm and dry.  Psychiatric: Mood, memory, affect and judgment normal.    Assessment and Plan :  1. Essential (primary) hypertension  - CBC with Differential/Platelet - Comprehensive metabolic panel - TSH  2. Type 2 diabetes mellitus without complication, without long-term current use of insulin (HCC) This is been under fairly good control. - Hemoglobin A1c  3. Hypercholesterolemia without hypertriglyceridemia On high-dose atorvastatin due to CAD - Lipid Panel With LDL/HDL Ratio  4. CAD in native artery All risk factors treated.

## 2018-03-28 LAB — COMPREHENSIVE METABOLIC PANEL
ALT: 14 IU/L (ref 0–44)
AST: 23 IU/L (ref 0–40)
Albumin/Globulin Ratio: 1.6 (ref 1.2–2.2)
Albumin: 4.1 g/dL (ref 3.7–4.7)
Alkaline Phosphatase: 65 IU/L (ref 39–117)
BUN/Creatinine Ratio: 15 (ref 10–24)
BUN: 17 mg/dL (ref 8–27)
Bilirubin Total: 0.9 mg/dL (ref 0.0–1.2)
CO2: 21 mmol/L (ref 20–29)
Calcium: 9.2 mg/dL (ref 8.6–10.2)
Chloride: 106 mmol/L (ref 96–106)
Creatinine, Ser: 1.17 mg/dL (ref 0.76–1.27)
GFR calc Af Amer: 71 mL/min/{1.73_m2} (ref >59)
GFR calc non Af Amer: 61 mL/min/{1.73_m2} (ref >59)
Globulin, Total: 2.5 g/dL (ref 1.5–4.5)
Glucose: 190 mg/dL — ABNORMAL HIGH (ref 65–99)
Potassium: 5 mmol/L (ref 3.5–5.2)
Sodium: 141 mmol/L (ref 134–144)
Total Protein: 6.6 g/dL (ref 6.0–8.5)

## 2018-03-28 LAB — CBC WITH DIFFERENTIAL/PLATELET
Basophils Absolute: 0 10*3/uL (ref 0.0–0.2)
Basos: 0 %
EOS (ABSOLUTE): 0.2 10*3/uL (ref 0.0–0.4)
Eos: 3 %
Hematocrit: 40.8 % (ref 37.5–51.0)
Hemoglobin: 13.8 g/dL (ref 13.0–17.7)
Immature Grans (Abs): 0 10*3/uL (ref 0.0–0.1)
Immature Granulocytes: 0 %
Lymphocytes Absolute: 1.8 10*3/uL (ref 0.7–3.1)
Lymphs: 22 %
MCH: 31.2 pg (ref 26.6–33.0)
MCHC: 33.8 g/dL (ref 31.5–35.7)
MCV: 92 fL (ref 79–97)
Monocytes Absolute: 0.6 10*3/uL (ref 0.1–0.9)
Monocytes: 7 %
Neutrophils Absolute: 5.4 10*3/uL (ref 1.4–7.0)
Neutrophils: 68 %
Platelets: 162 10*3/uL (ref 150–450)
RBC: 4.42 x10E6/uL (ref 4.14–5.80)
RDW: 12.8 % (ref 11.6–15.4)
WBC: 8 10*3/uL (ref 3.4–10.8)

## 2018-03-28 LAB — LIPID PANEL WITH LDL/HDL RATIO
Cholesterol, Total: 124 mg/dL (ref 100–199)
HDL: 44 mg/dL (ref >39)
LDL Calculated: 56 mg/dL (ref 0–99)
LDl/HDL Ratio: 1.3 ratio (ref 0.0–3.6)
Triglycerides: 120 mg/dL (ref 0–149)
VLDL Cholesterol Cal: 24 mg/dL (ref 5–40)

## 2018-03-28 LAB — TSH: TSH: 2.33 u[IU]/mL (ref 0.450–4.500)

## 2018-03-28 LAB — HEMOGLOBIN A1C
Est. average glucose Bld gHb Est-mCnc: 192 mg/dL
Hgb A1c MFr Bld: 8.3 % — ABNORMAL HIGH (ref 4.8–5.6)

## 2018-03-30 ENCOUNTER — Telehealth: Payer: Self-pay

## 2018-03-30 NOTE — Telephone Encounter (Signed)
-----   Message from Jerrol Banana., MD sent at 03/30/2018  1:02 PM EST ----- Labs stable but diabetes slightly higher with A1c of 8.3.  Continue to work on diet and exercise habits.

## 2018-03-30 NOTE — Telephone Encounter (Signed)
Advised  ED 

## 2018-04-04 DIAGNOSIS — E785 Hyperlipidemia, unspecified: Secondary | ICD-10-CM | POA: Diagnosis not present

## 2018-04-04 DIAGNOSIS — I208 Other forms of angina pectoris: Secondary | ICD-10-CM | POA: Diagnosis not present

## 2018-04-04 DIAGNOSIS — I209 Angina pectoris, unspecified: Secondary | ICD-10-CM | POA: Diagnosis not present

## 2018-04-04 DIAGNOSIS — R001 Bradycardia, unspecified: Secondary | ICD-10-CM | POA: Diagnosis not present

## 2018-04-04 DIAGNOSIS — R0602 Shortness of breath: Secondary | ICD-10-CM | POA: Diagnosis not present

## 2018-04-04 DIAGNOSIS — I2510 Atherosclerotic heart disease of native coronary artery without angina pectoris: Secondary | ICD-10-CM | POA: Diagnosis not present

## 2018-04-04 DIAGNOSIS — R011 Cardiac murmur, unspecified: Secondary | ICD-10-CM | POA: Diagnosis not present

## 2018-04-04 DIAGNOSIS — E669 Obesity, unspecified: Secondary | ICD-10-CM | POA: Diagnosis not present

## 2018-04-04 DIAGNOSIS — I10 Essential (primary) hypertension: Secondary | ICD-10-CM | POA: Diagnosis not present

## 2018-04-04 DIAGNOSIS — K219 Gastro-esophageal reflux disease without esophagitis: Secondary | ICD-10-CM | POA: Diagnosis not present

## 2018-04-05 ENCOUNTER — Other Ambulatory Visit: Payer: Self-pay | Admitting: Family Medicine

## 2018-04-15 ENCOUNTER — Ambulatory Visit (INDEPENDENT_AMBULATORY_CARE_PROVIDER_SITE_OTHER): Payer: Medicare Other | Admitting: Family Medicine

## 2018-04-15 VITALS — BP 152/71 | HR 56 | Temp 98.0°F | Resp 16 | Wt 184.0 lb

## 2018-04-15 DIAGNOSIS — J302 Other seasonal allergic rhinitis: Secondary | ICD-10-CM | POA: Diagnosis not present

## 2018-04-15 DIAGNOSIS — J329 Chronic sinusitis, unspecified: Secondary | ICD-10-CM

## 2018-04-15 DIAGNOSIS — I2510 Atherosclerotic heart disease of native coronary artery without angina pectoris: Secondary | ICD-10-CM

## 2018-04-15 MED ORDER — AZITHROMYCIN 250 MG PO TABS: ORAL_TABLET | ORAL | 0 refills | Status: AC

## 2018-04-15 NOTE — Progress Notes (Signed)
Patient: Carl Dominguez Male    DOB: 17-May-1945   73 y.o.   MRN: [id number] Visit Date: 04/15/2018  Today's Provider: Lelon Huh, MD   Chief Complaint  Patient presents with  . URI   Subjective:     HPI Upper Respiratory Infection: Patient complains of symptoms of a URI. Symptoms include congestion, cough and sore throat. Onset of symptoms was 4 days ago, gradually worsening since that time. He also c/o congestion and cough described as productive of white sputum and nocturnal for the past 4 days .  He is drinking plenty of fluids. Evaluation to date: none. Treatment to date: cough suppressants and decongestants. He did take 10 day course of Augmentin prescribed by his dentist for a tooth infection on 03/27/2018    No Known Allergies   Current Outpatient Medications:  .  aspirin 81 MG tablet, Take 81 mg by mouth daily. , Disp: , Rfl:  .  atorvastatin (LIPITOR) 40 MG tablet, TAKE 1 TABLET AT BEDTIME, Disp: 90 tablet, Rfl: 3 .  cetirizine (ZYRTEC) 10 MG tablet, Take 1 tablet (10 mg total) by mouth daily., Disp: 30 tablet, Rfl: 11 .  cholecalciferol (VITAMIN D) 1000 UNITS tablet, Take 1,000 Units by mouth daily. , Disp: , Rfl:  .  cholestyramine (QUESTRAN) 4 GM/DOSE powder, Take one 4 gram scoop daily.  No other medication 4 hours before or after., Disp: , Rfl:  .  famotidine (PEPCID) 20 MG tablet, Take 20 mg by mouth daily. , Disp: , Rfl:  .  fluticasone (FLONASE) 50 MCG/ACT nasal spray, Place 2 sprays into both nostrils daily., Disp: 16 g, Rfl: 12 .  glipiZIDE (GLUCOTROL) 10 MG tablet, TAKE 1 TABLET DAILY, Disp: 90 tablet, Rfl: 3 .  glucose blood (FREESTYLE TEST STRIPS) test strip, Check sugar once daily DX E11.9, Disp: 90 each, Rfl: 3 .  ibuprofen (ADVIL,MOTRIN) 200 MG tablet, Take 200 mg by mouth every 6 (six) hours as needed. , Disp: , Rfl:  .  Lancets (FREESTYLE) lancets, Use as instructed, Disp: 100 each, Rfl: 12 .  latanoprost (XALATAN) 0.005 % ophthalmic solution,  Place 1 drop into both eyes at bedtime. , Disp: , Rfl:  .  losartan (COZAAR) 50 MG tablet, TAKE 1 TABLET BY MOUTH EVERY DAY, Disp: 90 tablet, Rfl: 3 .  metoprolol succinate (TOPROL-XL) 25 MG 24 hr tablet, TAKE 1 TABLET DAILY, Disp: 90 tablet, Rfl: 3 .  montelukast (SINGULAIR) 10 MG tablet, take 1 tablet by mouth at bedtime, Disp: 30 tablet, Rfl: 11 .  pioglitazone (ACTOS) 45 MG tablet, TAKE 1 TABLET DAILY, Disp: 90 tablet, Rfl: 3 .  psyllium (METAMUCIL SMOOTH TEXTURE) 28 % packet, Take 1 packet by mouth 2 (two) times daily. , Disp: , Rfl:  .  vitamin C (ASCORBIC ACID) 500 MG tablet, Take 500 mg by mouth daily. , Disp: , Rfl:   Review of Systems  Constitutional: Negative.   HENT: Positive for congestion, postnasal drip, rhinorrhea and sore throat. Negative for ear pain, sinus pressure and sinus pain.   Respiratory: Positive for cough. Negative for shortness of breath and wheezing.     Social History   Tobacco Use  . Smoking status: Former Smoker    Types: Cigarettes    Last attempt to quit: 03/01/1982    Years since quitting: 36.1  . Smokeless tobacco: Never Used  . Tobacco comment: has been quit for 30 years  Substance Use Topics  . Alcohol use: Yes  Comment: occasionally- 1 beer      Objective:   BP (!) 152/71 (BP Location: Left Arm, Patient Position: Sitting, Cuff Size: Normal)   Pulse (!) 56   Temp 98 F (36.7 C) (Oral)   Resp 16   Wt 184 lb (83.5 kg)   SpO2 98%   BMI 28.82 kg/m  Vitals:   04/15/18 1120  BP: (!) 152/71  Pulse: (!) 56  Resp: 16  Temp: 98 F (36.7 C)  TempSrc: Oral  SpO2: 98%  Weight: 184 lb (83.5 kg)     Physical Exam  General Appearance:    Alert, cooperative, no distress  HENT:   bilateral TM normal without fluid or infection, neck without nodes, throat normal without erythema or exudate, frontal sinus tender and nasal mucosa pale and congested  Eyes:    PERRL, conjunctiva/corneas clear, EOM's intact       Lungs:     Clear to auscultation  bilaterally, respirations unlabored  Heart:    Regular rate and rhythm  Neurologic:   Awake, alert, oriented x 3. No apparent focal neurological           defect.          Assessment & Plan    1. Sinusitis, unspecified chronicity, unspecified location  - azithromycin (ZITHROMAX) 250 MG tablet; 2 by mouth today, then 1 daily for 4 days  Dispense: 6 tablet; Refill: 0  2. Seasonal allergic rhinitis, unspecified trigger Has just started back on fluticasone which he was encouraged to continue. Continue montelukast, zyrtec and Singulair.   Call if symptoms change or if not rapidly improving.       Lelon Huh, MD  [facility]

## 2018-04-15 NOTE — Patient Instructions (Signed)
.   Please review the attached list of medications and notify my office if there are any errors.   . Please bring all of your medications to every appointment so we can make sure that our medication list is the same as yours.   

## 2018-05-22 DIAGNOSIS — L570 Actinic keratosis: Secondary | ICD-10-CM | POA: Diagnosis not present

## 2018-05-22 DIAGNOSIS — L300 Nummular dermatitis: Secondary | ICD-10-CM | POA: Diagnosis not present

## 2018-05-22 DIAGNOSIS — L814 Other melanin hyperpigmentation: Secondary | ICD-10-CM | POA: Diagnosis not present

## 2018-06-19 ENCOUNTER — Other Ambulatory Visit: Payer: Self-pay | Admitting: Family Medicine

## 2018-06-19 DIAGNOSIS — I10 Essential (primary) hypertension: Secondary | ICD-10-CM

## 2018-06-26 ENCOUNTER — Other Ambulatory Visit: Payer: Self-pay | Admitting: Family Medicine

## 2018-06-26 DIAGNOSIS — E119 Type 2 diabetes mellitus without complications: Secondary | ICD-10-CM

## 2018-07-05 ENCOUNTER — Other Ambulatory Visit: Payer: Self-pay | Admitting: Family Medicine

## 2018-07-05 DIAGNOSIS — J302 Other seasonal allergic rhinitis: Secondary | ICD-10-CM

## 2018-07-10 ENCOUNTER — Other Ambulatory Visit: Payer: Self-pay | Admitting: Family Medicine

## 2018-07-10 DIAGNOSIS — J301 Allergic rhinitis due to pollen: Secondary | ICD-10-CM

## 2018-07-17 DIAGNOSIS — H401131 Primary open-angle glaucoma, bilateral, mild stage: Secondary | ICD-10-CM | POA: Diagnosis not present

## 2018-07-17 LAB — HM DIABETES EYE EXAM

## 2018-08-21 ENCOUNTER — Other Ambulatory Visit: Payer: Self-pay

## 2018-08-21 ENCOUNTER — Ambulatory Visit (INDEPENDENT_AMBULATORY_CARE_PROVIDER_SITE_OTHER): Payer: Medicare Other | Admitting: Family Medicine

## 2018-08-21 ENCOUNTER — Encounter: Payer: Self-pay | Admitting: Family Medicine

## 2018-08-21 VITALS — BP 118/68 | HR 60 | Temp 97.9°F | Resp 16 | Ht 67.0 in | Wt 175.0 lb

## 2018-08-21 DIAGNOSIS — I2510 Atherosclerotic heart disease of native coronary artery without angina pectoris: Secondary | ICD-10-CM

## 2018-08-21 DIAGNOSIS — E7800 Pure hypercholesterolemia, unspecified: Secondary | ICD-10-CM | POA: Diagnosis not present

## 2018-08-21 DIAGNOSIS — I10 Essential (primary) hypertension: Secondary | ICD-10-CM

## 2018-08-21 DIAGNOSIS — E119 Type 2 diabetes mellitus without complications: Secondary | ICD-10-CM

## 2018-08-21 DIAGNOSIS — K5904 Chronic idiopathic constipation: Secondary | ICD-10-CM | POA: Diagnosis not present

## 2018-08-21 LAB — POCT GLYCOSYLATED HEMOGLOBIN (HGB A1C)
Est. average glucose Bld gHb Est-mCnc: 177
Hemoglobin A1C: 7.8 % — AB (ref 4.0–5.6)

## 2018-08-21 NOTE — Progress Notes (Signed)
Patient: Carl Dominguez Male    DOB: 1945-05-28   73 y.o.   MRN: [id number] Visit Date: 08/21/2018  Today's Provider: [name], MD   Chief Complaint  Patient presents with  . Diabetes  . Hypertension  . Hyperlipidemia   Subjective:    HPI  Diabetes Mellitus Type II, Follow-up:  Pt feels well,no complaints. Lab Results  Component Value Date   HGBA1C 7.8 (A) 08/21/2018   HGBA1C 8.3 (H) 03/27/2018   HGBA1C 6.9 (A) 11/23/2017    Last seen for diabetes 6 months ago.  Management since then includes no medication changes. Monitor diet habits.  He reports good compliance with treatment. He is not having side effects.  Current symptoms include none and have been stable. Home blood sugar records: trend: stable  Episodes of hypoglycemia? no   Current Insulin Regimen: none Most Recent Eye Exam: up to date Weight trend: stable Prior visit with dietician: No Current exercise: yard work Current diet habits: well balanced  Pertinent Labs:    Component Value Date/Time   CHOL 124 03/27/2018 0000   TRIG 120 03/27/2018 0000   HDL 44 03/27/2018 0000   LDLCALC 56 03/27/2018 0000   CREATININE 1.17 03/27/2018 0000    Wt Readings from Last 3 Encounters:  08/21/18 175 lb (79.4 kg)  04/15/18 184 lb (83.5 kg)  03/27/18 182 lb (82.6 kg)    Hypertension, follow-up:  BP Readings from Last 3 Encounters:  08/21/18 118/68  04/15/18 (!) 152/71  03/27/18 130/70    He was last seen for hypertension 6 months ago.  BP at that visit was 130/70. Management since that visit includes no changes. He reports good compliance with treatment. He is not having side effects.  He is exercising. He is adherent to low salt diet.   Outside blood pressures are checked occasionally. He is experiencing none.  Patient denies exertional chest pressure/discomfort, lower extremity edema and palpitations.     Lipid/Cholesterol, Follow-up:   Last seen for this6 months ago.   Management changes since that visit include no changes. . Last Lipid Panel:    Component Value Date/Time   CHOL 124 03/27/2018 0000   TRIG 120 03/27/2018 0000   HDL 44 03/27/2018 0000   LDLCALC 56 03/27/2018 0000    Risk factors for vascular disease include diabetes mellitus and hypertension  He reports good compliance with treatment. He is not having side effects.  Current symptoms include none and have been stable.   No Known Allergies   Current Outpatient Medications:  .  aspirin 81 MG tablet, Take 81 mg by mouth daily. , Disp: , Rfl:  .  atorvastatin (LIPITOR) 40 MG tablet, TAKE 1 TABLET AT BEDTIME, Disp: 90 tablet, Rfl: 3 .  cetirizine (ZYRTEC) 10 MG tablet, TAKE 1 TABLET BY MOUTH ONCE DAILY, Disp: 30 tablet, Rfl: 11 .  cholecalciferol (VITAMIN D) 1000 UNITS tablet, Take 1,000 Units by mouth daily. , Disp: , Rfl:  .  cholestyramine (QUESTRAN) 4 GM/DOSE powder, Take one 4 gram scoop daily.  No other medication 4 hours before or after., Disp: , Rfl:  .  famotidine (PEPCID) 20 MG tablet, Take 20 mg by mouth daily. , Disp: , Rfl:  .  fluticasone (FLONASE) 50 MCG/ACT nasal spray, Place 2 sprays into both nostrils daily., Disp: 16 g, Rfl: 12 .  glipiZIDE (GLUCOTROL) 10 MG tablet, TAKE 1 TABLET DAILY, Disp: 90 tablet, Rfl: 3 .  glucose blood (FREESTYLE TEST STRIPS) test  strip, TEST BLOOD SUGAR ONCE DAILY AND IF NEEDED, Disp: 100 each, Rfl: 5 .  ibuprofen (ADVIL,MOTRIN) 200 MG tablet, Take 200 mg by mouth every 6 (six) hours as needed. , Disp: , Rfl:  .  Lactobacillus-Inulin (PROBIOTIC DIGESTIVE SUPPORT PO), Take 1 capsule by mouth daily., Disp: , Rfl:  .  Lancets (FREESTYLE) lancets, Use as instructed, Disp: 100 each, Rfl: 12 .  latanoprost (XALATAN) 0.005 % ophthalmic solution, Place 1 drop into both eyes at bedtime. , Disp: , Rfl:  .  losartan (COZAAR) 50 MG tablet, TAKE 1 TABLET BY MOUTH EVERY DAY, Disp: 90 tablet, Rfl: 3 .  metoprolol succinate (TOPROL-XL) 25 MG 24 hr tablet,  TAKE 1 TABLET DAILY, Disp: 90 tablet, Rfl: 3 .  montelukast (SINGULAIR) 10 MG tablet, TAKE 1 TABLET BY MOUTH AT BEDTIME, Disp: 30 tablet, Rfl: 11 .  pioglitazone (ACTOS) 45 MG tablet, TAKE 1 TABLET DAILY, Disp: 90 tablet, Rfl: 3 .  psyllium (METAMUCIL SMOOTH TEXTURE) 28 % packet, Take 1 packet by mouth 2 (two) times daily. , Disp: , Rfl:  .  vitamin C (ASCORBIC ACID) 500 MG tablet, Take 500 mg by mouth daily. , Disp: , Rfl:   Review of Systems  Constitutional: Negative for activity change, appetite change, chills, diaphoresis, fatigue, fever and unexpected weight change.  HENT: Negative.   Eyes: Negative.   Respiratory: Negative for cough and shortness of breath.   Cardiovascular: Negative for chest pain, palpitations and leg swelling.  Gastrointestinal: Negative for abdominal pain.  Endocrine: Negative for cold intolerance, heat intolerance, polydipsia, polyphagia and polyuria.  Musculoskeletal: Negative for arthralgias, joint swelling and myalgias.  Allergic/Immunologic: Negative for environmental allergies.  Neurological: Negative for dizziness, light-headedness and headaches.  Psychiatric/Behavioral: Negative for agitation, self-injury, sleep disturbance and suicidal ideas. The patient is not nervous/anxious.     Social History   Tobacco Use  . Smoking status: Former Smoker    Types: Cigarettes    Quit date: 03/01/1982    Years since quitting: 36.4  . Smokeless tobacco: Never Used  . Tobacco comment: has been quit for 30 years  Substance Use Topics  . Alcohol use: Yes    Comment: occasionally- 1 beer      Objective:   BP 118/68 (BP Location: Left Arm, Patient Position: Sitting, Cuff Size: Normal)   Pulse 60   Temp 97.9 F (36.6 C)   Resp 16   Ht 5\' 7"  (1.702 m)   Wt 175 lb (79.4 kg)   SpO2 96%   BMI 27.41 kg/m  Vitals:   08/21/18 0850  BP: 118/68  Pulse: 60  Resp: 16  Temp: 97.9 F (36.6 C)  SpO2: 96%  Weight: 175 lb (79.4 kg)  Height: 5\' 7"  (1.702 m)      Physical Exam Vitals signs reviewed.  Constitutional:      Appearance: He is well-developed.  HENT:     Head: Normocephalic and atraumatic.     Right Ear: External ear normal.     Left Ear: External ear normal.     Nose: Nose normal.  Eyes:     General: No scleral icterus.    Conjunctiva/sclera: Conjunctivae normal.  Neck:     Thyroid: No thyromegaly.  Cardiovascular:     Rate and Rhythm: Normal rate and regular rhythm.     Heart sounds: Normal heart sounds.  Pulmonary:     Effort: Pulmonary effort is normal.     Breath sounds: Normal breath sounds.  Abdominal:  Palpations: Abdomen is soft.  Skin:    General: Skin is warm and dry.  Neurological:     Mental Status: He is alert and oriented to person, place, and time.  Psychiatric:        Behavior: Behavior normal.        Thought Content: Thought content normal.        Judgment: Judgment normal.         Assessment & Plan    1. Type 2 diabetes mellitus without complication, without long-term current use of insulin (HCC) Trending better--continue lifestyle at age 31. - POCT glycosylated hemoglobin (Hb A1C)--7.8  2. Essential (primary) hypertension On Losartan,  3. Hypercholesterolemia without hypertriglyceridemia On lipitor.  4. CAD in native artery On metoprolol.More than 50% 25 minute visit spent in counseling or coordination of care   5. Chronic idiopathic constipation Better on Metamucil/cholestyramine.    I have done the exam and reviewed the above chart and it is accurate to the best of my knowledge. Development worker, community has been used in this note in any air is in the dictation or transcription are unintentional.  [name], MD  [place]

## 2018-08-30 DIAGNOSIS — M25521 Pain in right elbow: Secondary | ICD-10-CM | POA: Diagnosis not present

## 2018-08-30 DIAGNOSIS — I10 Essential (primary) hypertension: Secondary | ICD-10-CM | POA: Diagnosis not present

## 2018-08-30 DIAGNOSIS — Z7984 Long term (current) use of oral hypoglycemic drugs: Secondary | ICD-10-CM | POA: Diagnosis not present

## 2018-08-30 DIAGNOSIS — E119 Type 2 diabetes mellitus without complications: Secondary | ICD-10-CM | POA: Diagnosis not present

## 2018-08-30 DIAGNOSIS — E785 Hyperlipidemia, unspecified: Secondary | ICD-10-CM | POA: Diagnosis not present

## 2018-08-30 DIAGNOSIS — S59901A Unspecified injury of right elbow, initial encounter: Secondary | ICD-10-CM | POA: Diagnosis not present

## 2018-08-30 DIAGNOSIS — Z23 Encounter for immunization: Secondary | ICD-10-CM | POA: Diagnosis not present

## 2018-08-30 DIAGNOSIS — M25552 Pain in left hip: Secondary | ICD-10-CM | POA: Diagnosis not present

## 2018-09-16 IMAGING — CR DG CERVICAL SPINE 2 OR 3 VIEWS
1 series · 4 of 4 positions shown · non-contrast
Comparison: None

CLINICAL DATA: Pain and stiffness at RIGHT-side of neck for 5
months

EXAM:
CERVICAL SPINE - 2-3 VIEW

[Series 1: dg cervical spine 2 or 3 views · 0.14mm/px · 4 of 4 slices shown]
[im 1/4]
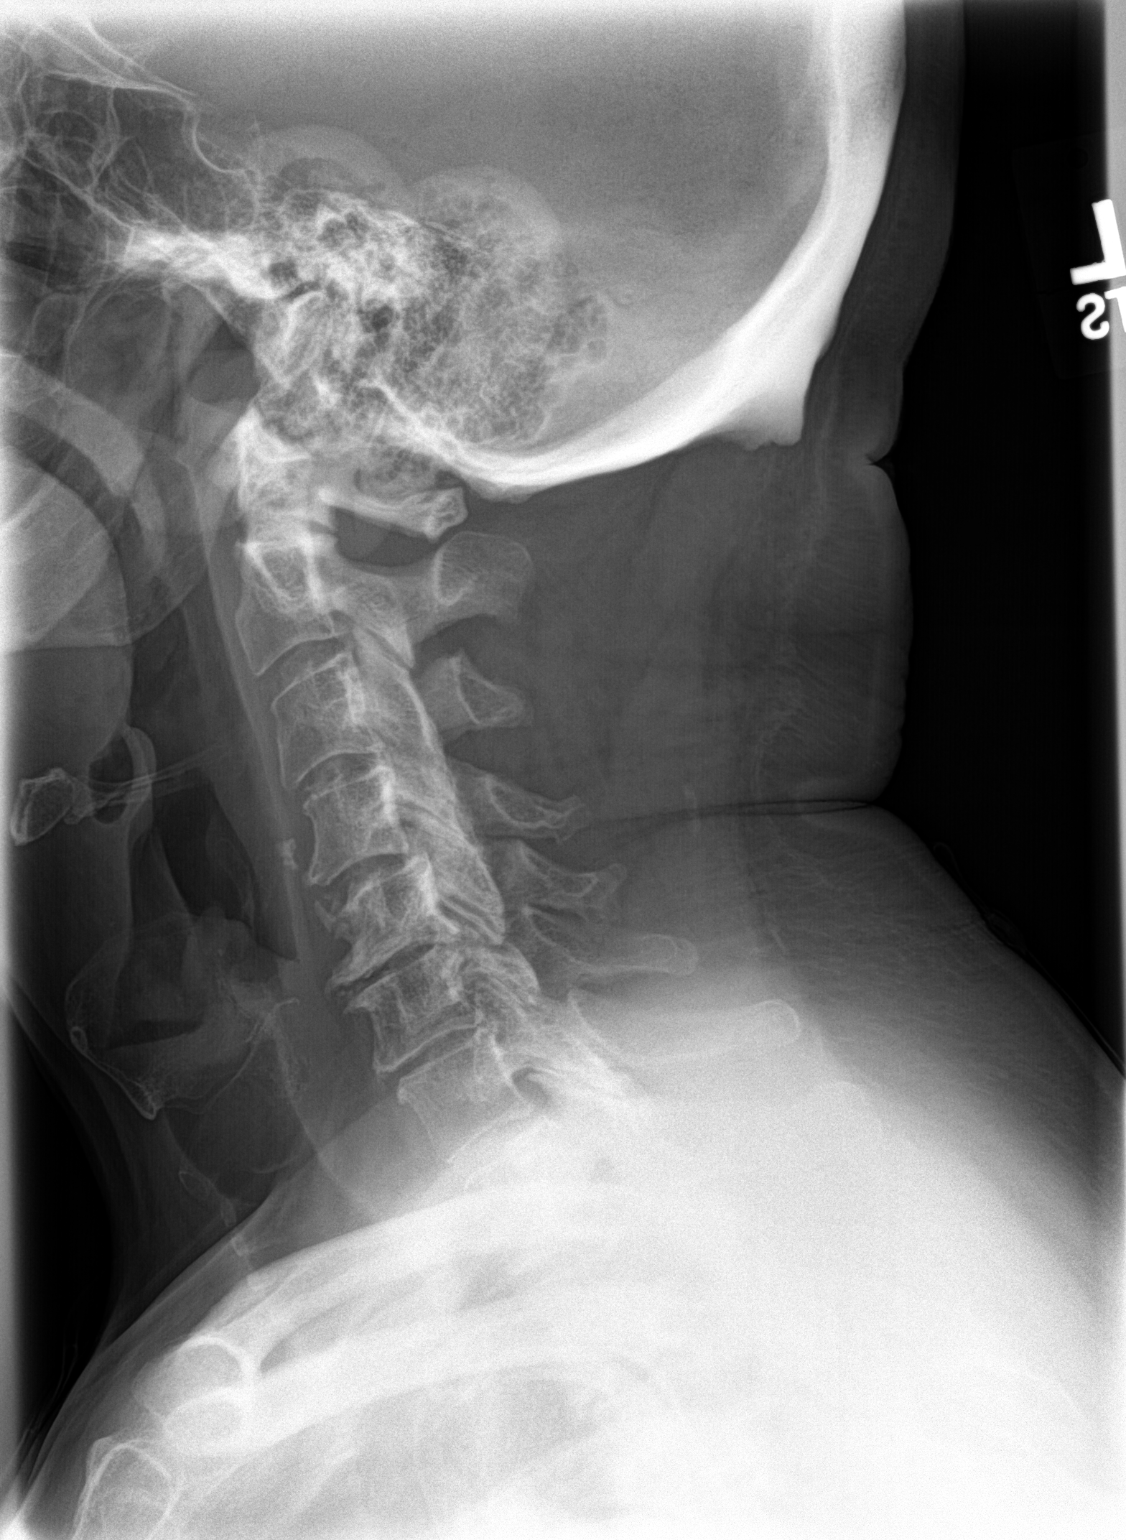
[im 2/4]
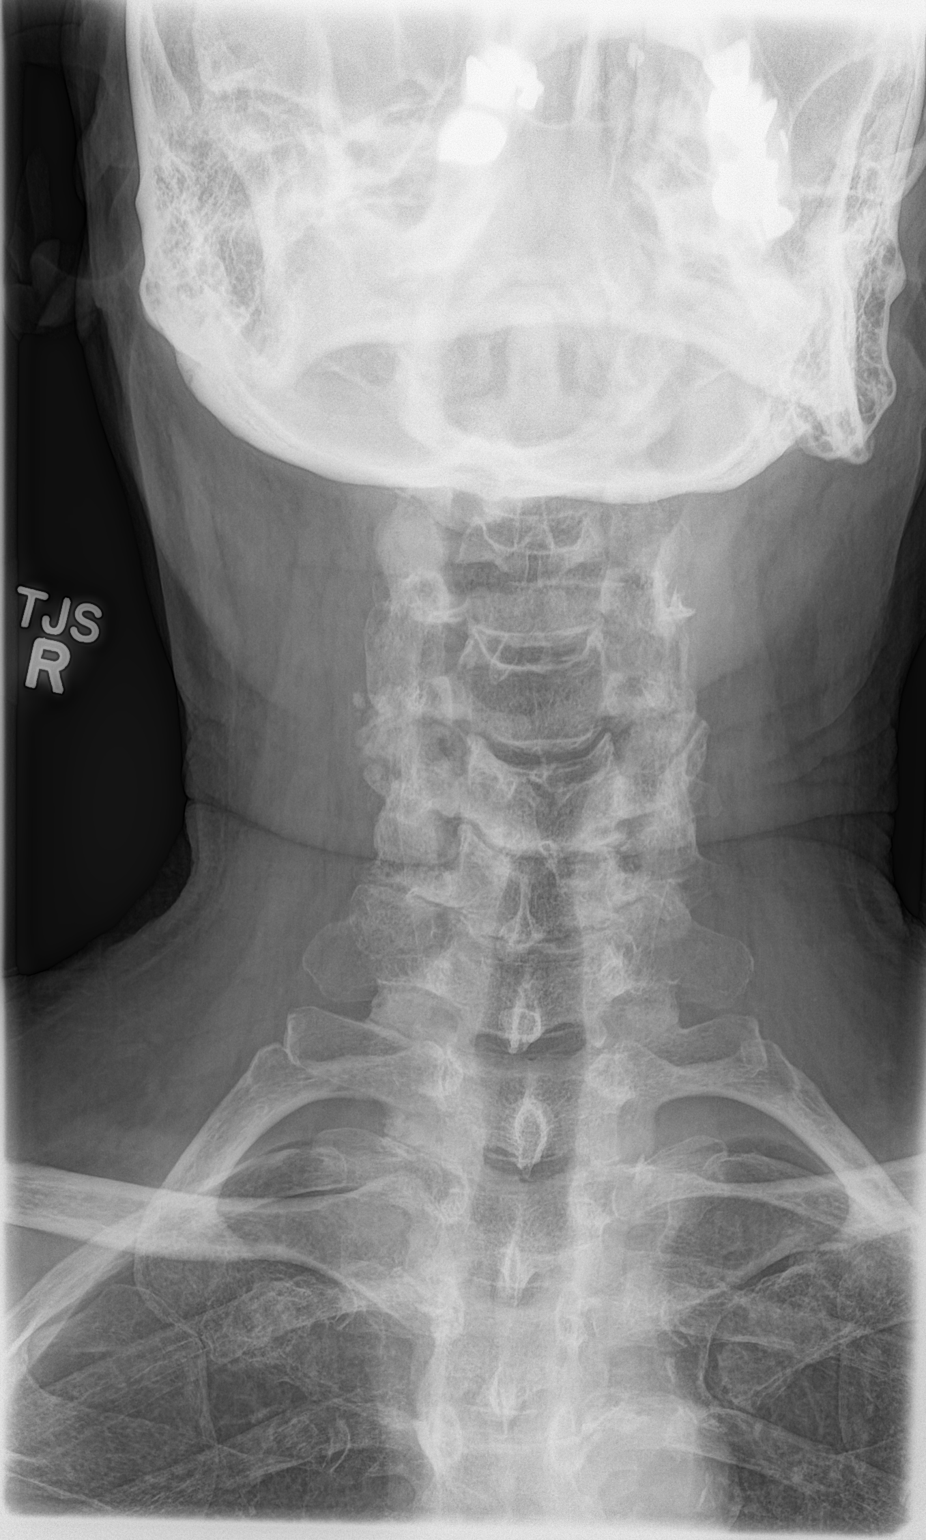
[im 3/4]
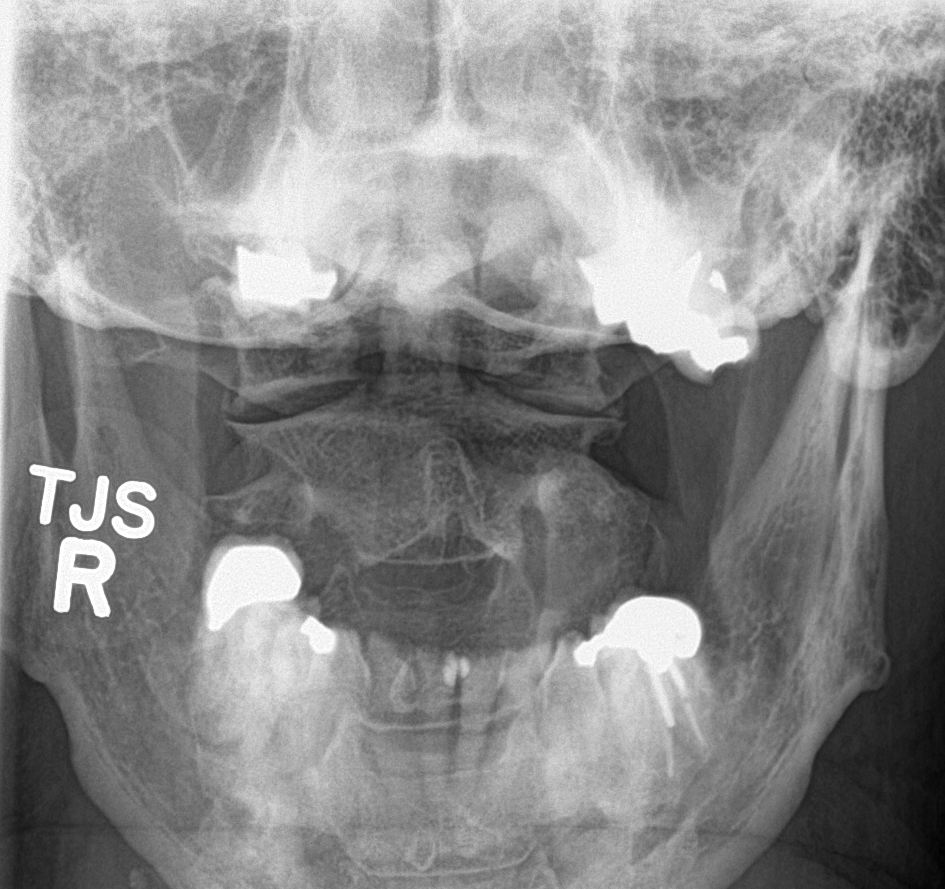
[im 4/4]
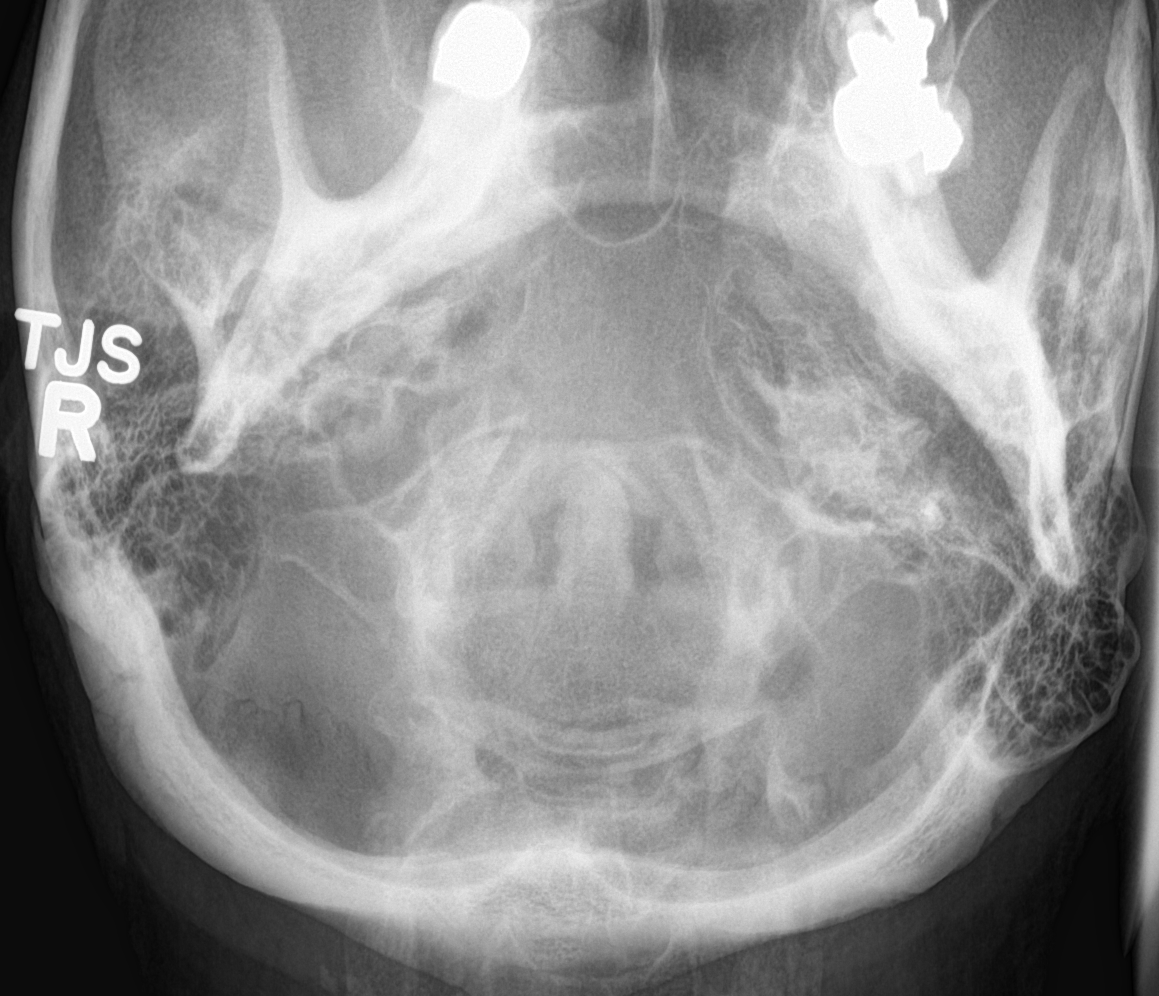

[4 of 4 positions shown; findings below may reference images not displayed]

FINDINGS: Prevertebral soft tissues normal thickness.

Disc space narrowing with endplate spur formation at C4-C5, C5-C6
and C6-C7.

2.5 mm anterolisthesis at C4-C5.

Multilevel facet degenerative changes.

Vertebral body heights maintained without fracture or additional
subluxation.

Lung apices clear.

Atherosclerotic calcification aortic arch.
IMPRESSION: Degenerative disc and facet disease changes of the cervical spine as
above.

Aortic atherosclerosis.

## 2018-10-13 ENCOUNTER — Other Ambulatory Visit: Payer: Self-pay | Admitting: Family Medicine

## 2018-10-13 DIAGNOSIS — E119 Type 2 diabetes mellitus without complications: Secondary | ICD-10-CM

## 2018-11-20 ENCOUNTER — Other Ambulatory Visit: Payer: Self-pay | Admitting: Family Medicine

## 2018-11-20 DIAGNOSIS — L570 Actinic keratosis: Secondary | ICD-10-CM | POA: Diagnosis not present

## 2018-11-20 DIAGNOSIS — L300 Nummular dermatitis: Secondary | ICD-10-CM | POA: Diagnosis not present

## 2018-11-20 DIAGNOSIS — L0889 Other specified local infections of the skin and subcutaneous tissue: Secondary | ICD-10-CM | POA: Diagnosis not present

## 2018-11-20 DIAGNOSIS — L814 Other melanin hyperpigmentation: Secondary | ICD-10-CM | POA: Diagnosis not present

## 2018-11-30 ENCOUNTER — Other Ambulatory Visit: Payer: Self-pay | Admitting: Family Medicine

## 2018-12-12 DIAGNOSIS — Z23 Encounter for immunization: Secondary | ICD-10-CM | POA: Diagnosis not present

## 2019-01-15 DIAGNOSIS — H401131 Primary open-angle glaucoma, bilateral, mild stage: Secondary | ICD-10-CM | POA: Diagnosis not present

## 2019-01-29 DIAGNOSIS — H401131 Primary open-angle glaucoma, bilateral, mild stage: Secondary | ICD-10-CM | POA: Diagnosis not present

## 2019-03-19 ENCOUNTER — Other Ambulatory Visit: Payer: Self-pay | Admitting: Family Medicine

## 2019-03-19 DIAGNOSIS — E119 Type 2 diabetes mellitus without complications: Secondary | ICD-10-CM

## 2019-03-19 MED ORDER — ATORVASTATIN CALCIUM 40 MG PO TABS: 40.0000 mg | ORAL_TABLET | Freq: Every day | ORAL | 1 refills | Status: DC

## 2019-03-19 NOTE — Telephone Encounter (Signed)
[facility] faxed refill request for the following medications: ° °atorvastatin (LIPITOR) 40 MG tablet  ° °Please advise. ° °

## 2019-03-23 NOTE — Progress Notes (Deleted)
Patient: Carl Dominguez, Male    DOB: 08-14-1945, 74 y.o.   MRN: [id number] Visit Date: 03/23/2019  Today's Provider: [name], MD   No chief complaint on file.  Subjective:     Patient had AWV with [facility] today at 9:40 am.   Complete Physical YOSHIYUKI Dominguez is a 74 y.o. male. He feels {DESC; WELL/FAIRLY WELL/POORLY:18703}. He reports exercising ***. He reports he is sleeping {DESC; WELL/FAIRLY WELL/POORLY:18703}.  -----------------------------------------------------------   Review of Systems  Social History   Socioeconomic History  . Marital status: Married    Spouse name: Not on file  . Number of children: 2  . Years of education: Not on file  . Highest education level: Some college, no degree  Occupational History  . Occupation: retired  Tobacco Use  . Smoking status: Former Smoker    Types: Cigarettes    Quit date: 03/01/1982    Years since quitting: 37.0  . Smokeless tobacco: Never Used  . Tobacco comment: has been quit for 30 years  Substance and Sexual Activity  . Alcohol use: Yes    Comment: occasionally- 1 beer  . Drug use: No  . Sexual activity: Not on file  Other Topics Concern  . Not on file  Social History Narrative  . Not on file   Social Determinants of Health   Financial Resource Strain:   . Difficulty of Paying Living Expenses: Not on file  Food Insecurity:   . Worried About Charity fundraiser in the Last Year: Not on file  . Ran Out of Food in the Last Year: Not on file  Transportation Needs:   . Lack of Transportation (Medical): Not on file  . Lack of Transportation (Non-Medical): Not on file  Physical Activity:   . Days of Exercise per Week: Not on file  . Minutes of Exercise per Session: Not on file  Stress:   . Feeling of Stress : Not on file  Social Connections:   . Frequency of Communication with Friends and Family: Not on file  . Frequency of Social Gatherings with Friends and Family: Not on file  . Attends  Religious Services: Not on file  . Active Member of Clubs or Organizations: Not on file  . Attends Archivist Meetings: Not on file  . Marital Status: Not on file  Intimate Partner Violence:   . Fear of Current or Ex-Partner: Not on file  . Emotionally Abused: Not on file  . Physically Abused: Not on file  . Sexually Abused: Not on file    Past Medical History:  Diagnosis Date  . Diabetes mellitus without complication ([place])   . Heart disease   . Hyperlipidemia   . Hypertension   . Myocardial infarction [facility]) 1999     Patient Active Problem List   Diagnosis Date Noted  . Actinic keratosis 11/14/2014  . CAD in native artery 11/14/2014  . Cognitive complaints 11/14/2014  . Diabetes ([place]) 11/14/2014  . Essential (primary) hypertension 11/14/2014  . Gastro-esophageal reflux disease without esophagitis 11/14/2014  . Hemorrhage of gastrointestinal tract 11/14/2014  . Internal hemorrhoids 11/14/2014  . Personal history of tobacco use, presenting hazards to health 11/14/2014  . HLD (hyperlipidemia) 11/14/2014  . Allergic state 11/14/2014  . Arthritis of hand, degenerative 11/14/2014  . Allergic rhinitis, seasonal 11/14/2014  . Prostatism 11/14/2014  . Hypercholesterolemia without hypertriglyceridemia 11/14/2014  . Change in blood platelet count 11/14/2014  . Diabetes mellitus, type 2 ([place])  11/14/2014  . Avitaminosis D 11/14/2014    Past Surgical History:  Procedure Laterality Date  . CATARACT EXTRACTION W/PHACO Right 03/24/2016   Procedure: CATARACT EXTRACTION PHACO AND INTRAOCULAR LENS PLACEMENT ([place])  Right;  Surgeon: Leandrew Koyanagi, MD;  Location: [facility];  Service: Ophthalmology;  Laterality: Right;  Diabetic - oral meds right eye  . CHOLECYSTECTOMY  2010  . CORONARY ANGIOPLASTY WITH STENT PLACEMENT  1999  . HERNIA REPAIR      His family history is not on file.   Current Outpatient Medications:  .  aspirin 81 MG tablet, Take 81 mg by mouth  daily. , Disp: , Rfl:  .  atorvastatin (LIPITOR) 40 MG tablet, Take 1 tablet (40 mg total) by mouth at bedtime., Disp: 90 tablet, Rfl: 1 .  cetirizine (ZYRTEC) 10 MG tablet, TAKE 1 TABLET BY MOUTH ONCE DAILY, Disp: 30 tablet, Rfl: 11 .  cholecalciferol (VITAMIN D) 1000 UNITS tablet, Take 1,000 Units by mouth daily. , Disp: , Rfl:  .  cholestyramine (QUESTRAN) 4 GM/DOSE powder, Take one 4 gram scoop daily.  No other medication 4 hours before or after., Disp: , Rfl:  .  famotidine (PEPCID) 20 MG tablet, Take 20 mg by mouth daily. , Disp: , Rfl:  .  fluticasone (FLONASE) 50 MCG/ACT nasal spray, Place 2 sprays into both nostrils daily., Disp: 16 g, Rfl: 12 .  glipiZIDE (GLUCOTROL) 10 MG tablet, TAKE 1 TABLET DAILY, Disp: 90 tablet, Rfl: 1 .  glucose blood (FREESTYLE TEST STRIPS) test strip, TEST BLOOD SUGAR ONCE DAILY AND IF NEEDED, Disp: 100 each, Rfl: 5 .  ibuprofen (ADVIL,MOTRIN) 200 MG tablet, Take 200 mg by mouth every 6 (six) hours as needed. , Disp: , Rfl:  .  Lactobacillus-Inulin (PROBIOTIC DIGESTIVE SUPPORT PO), Take 1 capsule by mouth daily., Disp: , Rfl:  .  Lancets (FREESTYLE) lancets, USE AS DIRECTED, Disp: 100 each, Rfl: 12 .  latanoprost (XALATAN) 0.005 % ophthalmic solution, Place 1 drop into both eyes at bedtime. , Disp: , Rfl:  .  losartan (COZAAR) 50 MG tablet, TAKE 1 TABLET BY MOUTH EVERY DAY, Disp: 90 tablet, Rfl: 3 .  metoprolol succinate (TOPROL-XL) 25 MG 24 hr tablet, TAKE 1 TABLET DAILY, Disp: 90 tablet, Rfl: 3 .  montelukast (SINGULAIR) 10 MG tablet, TAKE 1 TABLET BY MOUTH AT BEDTIME, Disp: 30 tablet, Rfl: 11 .  pioglitazone (ACTOS) 45 MG tablet, TAKE 1 TABLET DAILY, Disp: 90 tablet, Rfl: 3 .  psyllium (METAMUCIL SMOOTH TEXTURE) 28 % packet, Take 1 packet by mouth 2 (two) times daily. , Disp: , Rfl:  .  vitamin C (ASCORBIC ACID) 500 MG tablet, Take 500 mg by mouth daily. , Disp: , Rfl:   Patient Care Team: Jerrol Banana., MD as PCP - General (Family  Medicine) Leandrew Koyanagi, MD as Referring Physician (Ophthalmology) Richmond Campbell, MD as Consulting Physician (Gastroenterology)     Objective:    Vitals: There were no vitals taken for this visit.  Physical Exam  Activities of Daily Living No flowsheet data found.  Fall Risk Assessment Fall Risk  03/22/2018 03/04/2017 11/15/2016 11/05/2015 03/24/2015  Falls in the past year? 0 No No No No     Depression Screen PHQ 2/9 Scores 03/22/2018 03/04/2017 11/15/2016 11/05/2015  PHQ - 2 Score 0 0 0 0    6CIT Screen 03/22/2018  What Year? 0 points  What month? 0 points  What time? 0 points  Count back from 20 0 points  Months in  reverse 0 points  Repeat phrase 0 points  Total Score 0       Assessment & Plan:    Annual Physical Reviewed patient's Family Medical History Reviewed and updated list of patient's medical providers Assessment of cognitive impairment was done Assessed patient's functional ability Established a written schedule for health screening [place] Completed and Reviewed  Exercise Activities and Dietary recommendations Goals    . DIET - INCREASE WATER INTAKE     Recommend increasing water intake to 4 glasses a day.     . Exercise 3x per week (30 min per time)     Recommend to exercise for 3 days a week for at least 30 minutes at a time.         Immunization History  Administered Date(s) Administered  . Influenza, High Dose Seasonal PF 12/01/2015, 12/03/2016, 12/06/2017  . Influenza-Unspecified 11/30/2014, 12/12/2018  . Pneumococcal Conjugate-13 12/07/2012, 03/14/2014  . Pneumococcal Polysaccharide-23 02/03/2005, 12/13/2008  . Tdap 12/06/2007  . Zoster 12/31/2010  . Zoster Recombinat (Shingrix) 12/03/2016, 04/14/2017    Health Maintenance  Topic Date Due  . FOOT EXAM  11/15/2017  . HEMOGLOBIN A1C  02/20/2019  . OPHTHALMOLOGY EXAM  07/17/2019  . COLONOSCOPY  04/04/2022  . TETANUS/TDAP  08/29/2028  . INFLUENZA VACCINE   Completed  . Hepatitis C Screening  Completed  . PNA vac Low Risk Adult  Completed     Discussed health benefits of physical activity, and encouraged him to engage in regular exercise appropriate for his age and condition.    ------------------------------------------------------------------------------------------------------------    [name], MD  [place]

## 2019-03-28 ENCOUNTER — Encounter: Payer: Medicare Other | Admitting: Family Medicine

## 2019-03-28 ENCOUNTER — Ambulatory Visit: Payer: Medicare Other

## 2019-03-29 NOTE — Progress Notes (Signed)
Subjective:   Carl Dominguez is a 74 y.o. male who presents for Medicare Annual/Subsequent preventive examination.    This visit is being conducted through telemedicine due to the COVID-19 pandemic. This patient has given me verbal consent via doximity to conduct this visit, patient states they are participating from their home address. Some vital signs may be absent or patient reported.    Patient identification: identified by name, DOB, and current address  Review of Systems:  N/A  Cardiac Risk Factors include: advanced age (>63men, >12 women);dyslipidemia;hypertension;male gender     Objective:    Vitals: There were no vitals taken for this visit.  There is no height or weight on file to calculate BMI. Unable to obtain vitals due to visit being conducted via telephonically.   Advanced Directives 04/02/2019 03/22/2018 03/04/2017 03/24/2016  Does Patient Have a Medical Advance Directive? No No Yes Yes  Type of Advance Directive - - Living will Living will  Does patient want to make changes to medical advance directive? - - - No - Patient declined  Would patient like information on creating a medical advance directive? No - Patient declined No - Patient declined - -    Tobacco Social History   Tobacco Use  Smoking Status Former Smoker  . Types: Cigarettes  . Quit date: 03/01/1982  . Years since quitting: 37.1  Smokeless Tobacco Never Used  Tobacco Comment   has been quit for 30 years     Counseling given: Not Answered Comment: has been quit for 30 years   Clinical Intake:  Pre-visit preparation completed: Yes  Pain : No/denies pain Pain Score: 0-No pain     Nutritional Risks: None Diabetes: Yes  How often do you need to have someone help you when you read instructions, pamphlets, or other written materials from your doctor or pharmacy?: 1 - Never   Diabetes:  Is the patient diabetic?  Yes  If diabetic, was a CBG obtained today?  No  Did the patient bring in  their glucometer from home?  No  How often do you monitor your CBG's? Once a day in morning.   Financial Strains and Diabetes Management:  Are you having any financial strains with the device, your supplies or your medication? No .  Does the patient want to be seen by Chronic Care Management for management of their diabetes?  No  Would the patient like to be referred to a Nutritionist or for Diabetic Management?  No   Diabetic Exams:  Diabetic Eye Exam: Completed 07/17/18. Repeat yearly.  Diabetic Foot Exam: Completed 11/15/16. Pt has been advised about the importance in completing this exam. Note made to follow up on this at next in office apt.    Interpreter Needed?: No  Information entered by :: [facility], LPN  Past Medical History:  Diagnosis Date  . Diabetes mellitus without complication ([place])   . Heart disease   . Hyperlipidemia   . Hypertension   . Myocardial infarction ([place]) 1999   Past Surgical History:  Procedure Laterality Date  . CATARACT EXTRACTION W/PHACO Right 03/24/2016   Procedure: CATARACT EXTRACTION PHACO AND INTRAOCULAR LENS PLACEMENT ([place])  Right;  Surgeon: Leandrew Koyanagi, MD;  Location: [facility];  Service: Ophthalmology;  Laterality: Right;  Diabetic - oral meds right eye  . CHOLECYSTECTOMY  2010  . CORONARY ANGIOPLASTY WITH STENT PLACEMENT  1999  . HERNIA REPAIR     History reviewed. No pertinent family history. Social History   Socioeconomic History  .  Marital status: Married    Spouse name: Not on file  . Number of children: 2  . Years of education: Not on file  . Highest education level: Some college, no degree  Occupational History  . Occupation: retired  Tobacco Use  . Smoking status: Former Smoker    Types: Cigarettes    Quit date: 03/01/1982    Years since quitting: 37.1  . Smokeless tobacco: Never Used  . Tobacco comment: has been quit for 30 years  Substance and Sexual Activity  . Alcohol use: Yes    Comment:  occasionally- 1 beer  . Drug use: No  . Sexual activity: Not on file  Other Topics Concern  . Not on file  Social History Narrative  . Not on file   Social Determinants of Health   Financial Resource Strain: Low Risk   . Difficulty of Paying Living Expenses: Not hard at all  Food Insecurity: No Food Insecurity  . Worried About Charity fundraiser in the Last Year: Never true  . Ran Out of Food in the Last Year: Never true  Transportation Needs: No Transportation Needs  . Lack of Transportation (Medical): No  . Lack of Transportation (Non-Medical): No  Physical Activity: Inactive  . Days of Exercise per Week: 0 days  . Minutes of Exercise per Session: 0 min  Stress: No Stress Concern Present  . Feeling of Stress : Not at all  Social Connections: Slightly Isolated  . Frequency of Communication with Friends and Family: More than three times a week  . Frequency of Social Gatherings with Friends and Family: Three times a week  . Attends Religious Services: More than 4 times per year  . Active Member of Clubs or Organizations: No  . Attends Archivist Meetings: Never  . Marital Status: Married    Outpatient Encounter Medications as of 04/02/2019  Medication Sig  . aspirin 81 MG tablet Take 81 mg by mouth daily.   Marland Kitchen atorvastatin (LIPITOR) 40 MG tablet Take 1 tablet (40 mg total) by mouth at bedtime.  . cetirizine (ZYRTEC) 10 MG tablet TAKE 1 TABLET BY MOUTH ONCE DAILY  . cholecalciferol (VITAMIN D) 1000 UNITS tablet Take 1,000 Units by mouth daily.   . cholestyramine (QUESTRAN) 4 GM/DOSE powder Take one 4 gram scoop daily.  No other medication 4 hours before or after.  . famotidine (PEPCID) 20 MG tablet Take 20 mg by mouth daily.   . fluticasone (FLONASE) 50 MCG/ACT nasal spray Place 2 sprays into both nostrils daily. (Patient taking differently: Place 2 sprays into both nostrils daily. As needed)  . glipiZIDE (GLUCOTROL) 10 MG tablet TAKE 1 TABLET DAILY  . glucose blood  (FREESTYLE TEST STRIPS) test strip TEST BLOOD SUGAR ONCE DAILY AND IF NEEDED  . ibuprofen (ADVIL,MOTRIN) 200 MG tablet Take 400 mg by mouth every morning.   . Lactobacillus-Inulin (PROBIOTIC DIGESTIVE SUPPORT PO) Take 1 capsule by mouth daily.  . Lancets (FREESTYLE) lancets USE AS DIRECTED  . latanoprost (XALATAN) 0.005 % ophthalmic solution Place 1 drop into both eyes at bedtime.   Marland Kitchen losartan (COZAAR) 50 MG tablet TAKE 1 TABLET BY MOUTH EVERY DAY  . metoprolol succinate (TOPROL-XL) 25 MG 24 hr tablet TAKE 1 TABLET DAILY  . montelukast (SINGULAIR) 10 MG tablet TAKE 1 TABLET BY MOUTH AT BEDTIME  . pioglitazone (ACTOS) 45 MG tablet TAKE 1 TABLET DAILY  . psyllium (METAMUCIL SMOOTH TEXTURE) 28 % packet Take 1 packet by mouth 2 (two) times daily.   Marland Kitchen  vitamin C (ASCORBIC ACID) 500 MG tablet Take 500 mg by mouth daily.    No facility-administered encounter medications on file as of 04/02/2019.    Activities of Daily Living In your present state of health, do you have any difficulty performing the following activities: 04/02/2019  Hearing? N  Comment Wears bilateral hearing aids.  Vision? N  Difficulty concentrating or making decisions? N  Walking or climbing stairs? N  Dressing or bathing? N  Doing errands, shopping? N  Preparing Food and eating ? N  Using the Toilet? N  In the past six months, have you accidently leaked urine? N  Do you have problems with loss of bowel control? N  Managing your Medications? N  Managing your Finances? N  Housekeeping or managing your Housekeeping? N  Some recent data might be hidden    Patient Care Team: Jerrol Banana., MD as PCP - General (Family Medicine) Leandrew Koyanagi, MD as Referring Physician (Ophthalmology) Richmond Campbell, MD as Consulting Physician (Gastroenterology) Yolonda Kida, MD as Consulting Physician (Cardiology)   Assessment:   This is a routine wellness examination for Krithik.  Exercise Activities and Dietary  recommendations Current Exercise Habits: The patient does not participate in regular exercise at present, Exercise limited by: None identified  Goals    . DIET - INCREASE WATER INTAKE     Recommend increasing water intake to 4 glasses a day.     . Exercise 3x per week (30 min per time)     Recommend to exercise for 3 days a week for at least 30 minutes at a time.      Marland Kitchen LIFESTYLE - DECREASE FALLS RISK     Recommend to remove any items from the home that may cause slips or trips.       Fall Risk Fall Risk  04/02/2019 03/22/2018 03/04/2017 11/15/2016 11/05/2015  Falls in the past year? 1 0 No No No  Number falls in past yr: 0 - - - -  Injury with Fall? 0 - - - -  Follow up Falls prevention discussed - - - -   FALL RISK PREVENTION PERTAINING TO THE HOME:  Any stairs in or around the home? No  If so, are there any without handrails? N/A  Home free of loose throw rugs in walkways, pet beds, electrical cords, etc? Yes  Adequate lighting in your home to reduce risk of falls? Yes   ASSISTIVE DEVICES UTILIZED TO PREVENT FALLS:  Life alert? No  Use of a cane, walker or w/c? No  Grab bars in the bathroom? No  Shower chair or bench in shower? No  Elevated toilet seat or a handicapped toilet? No    TIMED UP AND GO:  Was the test performed? No .    Depression Screen PHQ 2/9 Scores 04/02/2019 04/02/2019 03/22/2018 03/04/2017  PHQ - 2 Score 0 0 0 0    Cognitive Function: Declined today.      6CIT Screen 03/22/2018 03/04/2017  What Year? 0 points 0 points  What month? 0 points 0 points  What time? 0 points 0 points  Count back from 20 0 points 0 points  Months in reverse 0 points 0 points  Repeat phrase 0 points 2 points  Total Score 0 2    Immunization History  Administered Date(s) Administered  . Influenza, High Dose Seasonal PF 12/01/2015, 12/03/2016, 12/06/2017  . Influenza-Unspecified 11/30/2014, 12/12/2018  . Pneumococcal Conjugate-13 12/07/2012, 03/14/2014  . Pneumococcal  Polysaccharide-23 02/03/2005, 12/13/2008  .  Tdap 12/06/2007, 08/30/2018  . Zoster 12/31/2010  . Zoster Recombinat (Shingrix) 12/03/2016, 04/14/2017    Qualifies for Shingles Vaccine? Completed series  Tdap: Up to date  Flu Vaccine: Up to date  Pneumococcal Vaccine: Completed series  Screening Tests Health Maintenance  Topic Date Due  . FOOT EXAM  11/15/2017  . HEMOGLOBIN A1C  02/20/2019  . OPHTHALMOLOGY EXAM  07/17/2019  . COLONOSCOPY  04/04/2022  . TETANUS/TDAP  08/29/2028  . INFLUENZA VACCINE  Completed  . Hepatitis C Screening  Completed  . PNA vac Low Risk Adult  Completed   Cancer Screenings:  Colorectal Screening: Completed 04/15/17. Repeat every 5 years.   Lung Cancer Screening: (Low Dose CT Chest recommended if Age 61-80 years, 30 pack-year currently smoking OR have quit w/in 15years.) does not qualify.   Additional Screening:  Hepatitis C Screening: Up to date  Dental Screening: Recommended annual dental exams for proper oral hygiene  Community Resource Referral:  CRR required this visit?  No        Plan:  I have personally reviewed and addressed the Medicare Annual Wellness questionnaire and have noted the following in the patient's chart:  A. Medical and social history B. Use of alcohol, tobacco or illicit drugs  C. Current medications and supplements D. Functional ability and status E.  Nutritional status F.  Physical activity G. Advance directives H. List of other physicians I.  Hospitalizations, surgeries, and ER visits in previous 12 months J.  [place] such as hearing and vision if needed, cognitive and depression L. Referrals and appointments   In addition, I have reviewed and discussed with patient certain preventive protocols, quality metrics, and best practice recommendations. A written personalized care plan for preventive services as well as general preventive health recommendations were provided to patient.   Glendora Score, [place]  [id number] Nurse Health Advisor  Nurse Notes: Pt needs a diabetic foot exam and Hgb A1c checked at next in office visit.

## 2019-04-02 ENCOUNTER — Ambulatory Visit (INDEPENDENT_AMBULATORY_CARE_PROVIDER_SITE_OTHER): Payer: Medicare Other

## 2019-04-02 ENCOUNTER — Other Ambulatory Visit: Payer: Self-pay

## 2019-04-02 DIAGNOSIS — Z0000 Encounter for general adult medical examination without abnormal findings: Secondary | ICD-10-CM | POA: Diagnosis not present

## 2019-04-02 NOTE — Patient Instructions (Signed)
Carl Dominguez , Thank you for taking time to come for your Medicare Wellness Visit. I appreciate your ongoing commitment to your health goals. Please review the following plan we discussed and let me know if I can assist you in the future.   Screening recommendations/referrals: Colonoscopy: Up to date, due 04/2022 Recommended yearly ophthalmology/optometry visit for glaucoma screening and checkup Recommended yearly dental visit for hygiene and checkup  Vaccinations: Influenza vaccine: Up to date Pneumococcal vaccine: Completed series Tdap vaccine: Up to date, due 08/2028 Shingles vaccine: Completed series    Advanced directives: Pt requested a copy of the form to complete. Copy left at front office for pick up at next appointment.  Conditions/risks identified: Fall risk prevention discussed today. Recommend to exercise 3 days a week for 30 minutes at a time. Also recommend to increase water intake to 6-8 8 oz glasses a day.   Next appointment: 04/25/19 @ 10:20 AM with Dr Rosanna Randy.   Preventive Care 38 Years and Older, Male Preventive care refers to lifestyle choices and visits with your health care provider that can promote health and wellness. What does preventive care include?  A yearly physical exam. This is also called an annual well check.  Dental exams once or twice a year.  Routine eye exams. Ask your health care provider how often you should have your eyes checked.  Personal lifestyle choices, including:  Daily care of your teeth and gums.  Regular physical activity.  Eating a healthy diet.  Avoiding tobacco and drug use.  Limiting alcohol use.  Practicing safe sex.  Taking low doses of aspirin every day.  Taking vitamin and mineral supplements as recommended by your health care provider. What happens during an annual well check? The services and screenings done by your health care provider during your annual well check will depend on your age, overall health,  lifestyle risk factors, and family history of disease. Counseling  Your health care provider may ask you questions about your:  Alcohol use.  Tobacco use.  Drug use.  Emotional well-being.  Home and relationship well-being.  Sexual activity.  Eating habits.  History of falls.  Memory and ability to understand (cognition).  Work and work Statistician. Screening  You may have the following tests or measurements:  Height, weight, and BMI.  Blood pressure.  Lipid and cholesterol levels. These may be checked every 5 years, or more frequently if you are over 104 years old.  Skin check.  Lung cancer screening. You may have this screening every year starting at age 1 if you have a 30-pack-year history of smoking and currently smoke or have quit within the past 15 years.  Fecal occult blood test (FOBT) of the stool. You may have this test every year starting at age 34.  Flexible sigmoidoscopy or colonoscopy. You may have a sigmoidoscopy every 5 years or a colonoscopy every 10 years starting at age 12.  Prostate cancer screening. Recommendations will vary depending on your family history and other risks.  Hepatitis C blood test.  Hepatitis B blood test.  Sexually transmitted disease (STD) testing.  Diabetes screening. This is done by checking your blood sugar (glucose) after you have not eaten for a while (fasting). You may have this done every 1-3 years.  Abdominal aortic aneurysm (AAA) screening. You may need this if you are a current or former smoker.  Osteoporosis. You may be screened starting at age 15 if you are at high risk. Talk with your health care provider about  your test results, treatment options, and if necessary, the need for more tests. Vaccines  Your health care provider may recommend certain vaccines, such as:  Influenza vaccine. This is recommended every year.  Tetanus, diphtheria, and acellular pertussis (Tdap, Td) vaccine. You may need a Td booster  every 10 years.  Zoster vaccine. You may need this after age 18.  Pneumococcal 13-valent conjugate (PCV13) vaccine. One dose is recommended after age 28.  Pneumococcal polysaccharide (PPSV23) vaccine. One dose is recommended after age 62. Talk to your health care provider about which screenings and vaccines you need and how often you need them. This information is not intended to replace advice given to you by your health care provider. Make sure you discuss any questions you have with your health care provider. Document Released: 03/14/2015 Document Revised: 11/05/2015 Document Reviewed: 12/17/2014 Elsevier Interactive Patient Education  2017 [place] Prevention in the Home Falls can cause injuries. They can happen to people of all ages. There are many things you can do to make your home safe and to help prevent falls. What can I do on the outside of my home?  Regularly fix the edges of walkways and driveways and fix any cracks.  Remove anything that might make you trip as you walk through a door, such as a raised step or threshold.  Trim any bushes or trees on the path to your home.  Use bright outdoor lighting.  Clear any walking paths of anything that might make someone trip, such as rocks or tools.  Regularly check to see if handrails are loose or broken. Make sure that both sides of any steps have handrails.  Any raised decks and porches should have guardrails on the edges.  Have any leaves, snow, or ice cleared regularly.  Use sand or salt on walking paths during winter.  Clean up any spills in your garage right away. This includes oil or grease spills. What can I do in the bathroom?  Use night lights.  Install grab bars by the toilet and in the tub and shower. Do not use towel bars as grab bars.  Use non-skid mats or decals in the tub or shower.  If you need to sit down in the shower, use a plastic, non-slip stool.  Keep the floor dry. Clean up any  water that spills on the floor as soon as it happens.  Remove soap buildup in the tub or shower regularly.  Attach bath mats securely with double-sided non-slip rug tape.  Do not have throw rugs and other things on the floor that can make you trip. What can I do in the bedroom?  Use night lights.  Make sure that you have a light by your bed that is easy to reach.  Do not use any sheets or blankets that are too big for your bed. They should not hang down onto the floor.  Have a firm chair that has side arms. You can use this for support while you get dressed.  Do not have throw rugs and other things on the floor that can make you trip. What can I do in the kitchen?  Clean up any spills right away.  Avoid walking on wet floors.  Keep items that you use a lot in easy-to-reach places.  If you need to reach something above you, use a strong step stool that has a grab bar.  Keep electrical cords out of the way.  Do not use floor polish or wax  that makes floors slippery. If you must use wax, use non-skid floor wax.  Do not have throw rugs and other things on the floor that can make you trip. What can I do with my stairs?  Do not leave any items on the stairs.  Make sure that there are handrails on both sides of the stairs and use them. Fix handrails that are broken or loose. Make sure that handrails are as long as the stairways.  Check any carpeting to make sure that it is firmly attached to the stairs. Fix any carpet that is loose or worn.  Avoid having throw rugs at the top or bottom of the stairs. If you do have throw rugs, attach them to the floor with carpet tape.  Make sure that you have a light switch at the top of the stairs and the bottom of the stairs. If you do not have them, ask someone to add them for you. What else can I do to help prevent falls?  Wear shoes that:  Do not have high heels.  Have rubber bottoms.  Are comfortable and fit you well.  Are closed  at the toe. Do not wear sandals.  If you use a stepladder:  Make sure that it is fully opened. Do not climb a closed stepladder.  Make sure that both sides of the stepladder are locked into place.  Ask someone to hold it for you, if possible.  Clearly mark and make sure that you can see:  Any grab bars or handrails.  First and last steps.  Where the edge of each step is.  Use tools that help you move around (mobility aids) if they are needed. These include:  Canes.  Walkers.  Scooters.  Crutches.  Turn on the lights when you go into a dark area. Replace any light bulbs as soon as they burn out.  Set up your furniture so you have a clear path. Avoid moving your furniture around.  If any of your floors are uneven, fix them.  If there are any pets around you, be aware of where they are.  Review your medicines with your doctor. Some medicines can make you feel dizzy. This can increase your chance of falling. Ask your doctor what other things that you can do to help prevent falls. This information is not intended to replace advice given to you by your health care provider. Make sure you discuss any questions you have with your health care provider. Document Released: 12/12/2008 Document Revised: 07/24/2015 Document Reviewed: 03/22/2014 Elsevier Interactive Patient Education  2017 [employer].

## 2019-04-10 DIAGNOSIS — I2510 Atherosclerotic heart disease of native coronary artery without angina pectoris: Secondary | ICD-10-CM | POA: Diagnosis not present

## 2019-04-10 DIAGNOSIS — E785 Hyperlipidemia, unspecified: Secondary | ICD-10-CM | POA: Diagnosis not present

## 2019-04-10 DIAGNOSIS — R011 Cardiac murmur, unspecified: Secondary | ICD-10-CM | POA: Diagnosis not present

## 2019-04-10 DIAGNOSIS — E119 Type 2 diabetes mellitus without complications: Secondary | ICD-10-CM | POA: Diagnosis not present

## 2019-04-10 DIAGNOSIS — I10 Essential (primary) hypertension: Secondary | ICD-10-CM | POA: Diagnosis not present

## 2019-04-10 DIAGNOSIS — E669 Obesity, unspecified: Secondary | ICD-10-CM | POA: Diagnosis not present

## 2019-04-10 DIAGNOSIS — I208 Other forms of angina pectoris: Secondary | ICD-10-CM | POA: Diagnosis not present

## 2019-04-10 DIAGNOSIS — I209 Angina pectoris, unspecified: Secondary | ICD-10-CM | POA: Diagnosis not present

## 2019-04-10 DIAGNOSIS — R0602 Shortness of breath: Secondary | ICD-10-CM | POA: Diagnosis not present

## 2019-04-10 DIAGNOSIS — R001 Bradycardia, unspecified: Secondary | ICD-10-CM | POA: Diagnosis not present

## 2019-04-24 NOTE — Progress Notes (Signed)
Patient: Carl Dominguez Male    DOB: December 21, 1945   74 y.o.   MRN: [id number] Visit Date: 04/25/2019  Today's Provider: [name], MD   Chief Complaint  Patient presents with  . Diabetes  . Hypertension  . Hyperlipidemia   Subjective:    I [place] S. Dimas, CMA, am acting as scribe for [name], MD.  Patient had AWV with [facility] 04/02/2019.  HPI   Diabetes Mellitus Type II, Follow-up:   Lab Results  Component Value Date   HGBA1C 7.8 (A) 08/21/2018   HGBA1C 8.3 (H) 03/27/2018   HGBA1C 6.9 (A) 11/23/2017    Last seen for diabetes 8 months ago.  Management since then includes no changes. He reports excellent compliance with treatment. He is not having side effects.  Current symptoms include none and have been stable. Home blood sugar records: fasting range: 180's  Episodes of hypoglycemia? no   Current insulin regiment: Is not on insulin Most Recent Eye Exam: 07/17/2018 Weight trend: stable Prior visit with dietician: No Current exercise: no regular exercise Current diet habits: in general, a "healthy" diet    Pertinent Labs:    Component Value Date/Time   CHOL 124 03/27/2018 0000   TRIG 120 03/27/2018 0000   HDL 44 03/27/2018 0000   LDLCALC 56 03/27/2018 0000   CREATININE 1.17 03/27/2018 0000    Wt Readings from Last 3 Encounters:  04/25/19 173 lb 3.2 oz (78.6 kg)  08/21/18 175 lb (79.4 kg)  04/15/18 184 lb (83.5 kg)    ------------------------------------------------------------------------   Hypertension, follow-up:  BP Readings from Last 3 Encounters:  04/25/19 114/72  08/21/18 118/68  04/15/18 (!) 152/71    He was last seen for hypertension 8 months ago.  BP at that visit was 118/68. Management changes since that visit include no changes continue Losartan. He reports excellent compliance with treatment. He is not having side effects.  He is not exercising. He is adherent to low salt diet.   Outside blood pressures  are stable. He is experiencing none.  Patient denies chest pain and lower extremity edema.   Cardiovascular risk factors include advanced age (older than 32 for men, 2 for women), diabetes mellitus, hypertension and male gender.  Use of agents associated with hypertension: none.     Weight trend: stable Wt Readings from Last 3 Encounters:  04/25/19 173 lb 3.2 oz (78.6 kg)  08/21/18 175 lb (79.4 kg)  04/15/18 184 lb (83.5 kg)   ------------------------------------------------------------------------   Lipid/Cholesterol, Follow-up:   Last seen for this1 years ago.  Management changes since that visit include no changes, continue Lipitor 40mg  daily. Last Lipid Panel:    Component Value Date/Time   CHOL 124 03/27/2018 0000   TRIG 120 03/27/2018 0000   HDL 44 03/27/2018 0000   LDLCALC 56 03/27/2018 0000    Risk factors for vascular disease include diabetes mellitus, hypercholesterolemia and hypertension  He reports excellent compliance with treatment. He is not having side effects.  Current symptoms include none and have been stable. Weight trend: stable Prior visit with dietician: no Current diet: in general, a "healthy" diet   Current exercise: no regular exercise  Wt Readings from Last 3 Encounters:  04/25/19 173 lb 3.2 oz (78.6 kg)  08/21/18 175 lb (79.4 kg)  04/15/18 184 lb (83.5 kg)    -------------------------------------------------------------------  CAD in native artery From 08/21/2018-On metoprolol.  Chronic idiopathic constipation From 08/21/2018-Better on Metamucil/cholestyramine.  Patient would like for  me to look at a cyst on his neck.  It is not painful and not draining.  It is healing.  No Known Allergies   Current Outpatient Medications:  .  aspirin 81 MG tablet, Take 81 mg by mouth daily. , Disp: , Rfl:  .  atorvastatin (LIPITOR) 40 MG tablet, Take 1 tablet (40 mg total) by mouth at bedtime., Disp: 90 tablet, Rfl: 1 .  cetirizine (ZYRTEC)  10 MG tablet, TAKE 1 TABLET BY MOUTH ONCE DAILY, Disp: 30 tablet, Rfl: 11 .  cholecalciferol (VITAMIN D) 1000 UNITS tablet, Take 1,000 Units by mouth daily. , Disp: , Rfl:  .  cholestyramine (QUESTRAN) 4 GM/DOSE powder, Take one 4 gram scoop daily.  No other medication 4 hours before or after., Disp: , Rfl:  .  famotidine (PEPCID) 20 MG tablet, Take 20 mg by mouth daily. , Disp: , Rfl:  .  fluticasone (FLONASE) 50 MCG/ACT nasal spray, Place 2 sprays into both nostrils daily. (Patient taking differently: Place 2 sprays into both nostrils daily. As needed), Disp: 16 g, Rfl: 12 .  glipiZIDE (GLUCOTROL) 10 MG tablet, TAKE 1 TABLET DAILY, Disp: 90 tablet, Rfl: 1 .  glucose blood (FREESTYLE TEST STRIPS) test strip, TEST BLOOD SUGAR ONCE DAILY AND IF NEEDED, Disp: 100 each, Rfl: 5 .  ibuprofen (ADVIL,MOTRIN) 200 MG tablet, Take 400 mg by mouth every morning. , Disp: , Rfl:  .  Lactobacillus-Inulin (PROBIOTIC DIGESTIVE SUPPORT PO), Take 1 capsule by mouth daily., Disp: , Rfl:  .  Lancets (FREESTYLE) lancets, USE AS DIRECTED, Disp: 100 each, Rfl: 12 .  latanoprost (XALATAN) 0.005 % ophthalmic solution, Place 1 drop into both eyes at bedtime. , Disp: , Rfl:  .  losartan (COZAAR) 50 MG tablet, TAKE 1 TABLET BY MOUTH EVERY DAY, Disp: 90 tablet, Rfl: 3 .  metoprolol succinate (TOPROL-XL) 25 MG 24 hr tablet, TAKE 1 TABLET DAILY, Disp: 90 tablet, Rfl: 3 .  montelukast (SINGULAIR) 10 MG tablet, TAKE 1 TABLET BY MOUTH AT BEDTIME, Disp: 30 tablet, Rfl: 11 .  pioglitazone (ACTOS) 45 MG tablet, TAKE 1 TABLET DAILY, Disp: 90 tablet, Rfl: 3 .  psyllium (METAMUCIL SMOOTH TEXTURE) 28 % packet, Take 1 packet by mouth 2 (two) times daily. , Disp: , Rfl:  .  triamcinolone cream (KENALOG) 0.1 %, APPLY TO AFFECTED AREA QD TO BID, Disp: , Rfl:  .  vitamin C (ASCORBIC ACID) 500 MG tablet, Take 500 mg by mouth daily. , Disp: , Rfl:   Review of Systems  Constitutional: Negative.   HENT: Negative.   Eyes: Negative.     Respiratory: Negative.   Cardiovascular: Negative.   Gastrointestinal: Negative.   Endocrine: Negative.   Genitourinary: Negative.   Musculoskeletal: Negative.   Skin: Negative.   Allergic/Immunologic: Negative.   Neurological: Negative.   Hematological: Negative.   Psychiatric/Behavioral: Negative.     Social History   Tobacco Use  . Smoking status: Former Smoker    Types: Cigarettes    Quit date: 03/01/1982    Years since quitting: 37.1  . Smokeless tobacco: Never Used  . Tobacco comment: has been quit for 30 years  Substance Use Topics  . Alcohol use: Yes    Comment: occasionally- 1 beer      Objective:   BP 114/72 (BP Location: Right Arm, Patient Position: Sitting, Cuff Size: Normal)   Pulse 60   Temp (!) 96.9 F (36.1 C) (Temporal)   Resp 16   Ht 5\' 7"  (1.702 m)  Wt 173 lb 3.2 oz (78.6 kg)   BMI 27.13 kg/m  Vitals:   04/25/19 1032  BP: 114/72  Pulse: 60  Resp: 16  Temp: (!) 96.9 F (36.1 C)  TempSrc: Temporal  Weight: 173 lb 3.2 oz (78.6 kg)  Height: 5\' 7"  (1.702 m)  Body mass index is 27.13 kg/m.   Physical Exam Vitals reviewed.  Constitutional:      Appearance: He is well-developed.  HENT:     Head: Normocephalic and atraumatic.     Right Ear: External ear normal.     Left Ear: External ear normal.     Nose: Nose normal.  Eyes:     General: No scleral icterus.    Conjunctiva/sclera: Conjunctivae normal.  Neck:     Thyroid: No thyromegaly.     Vascular: No carotid bruit.  Cardiovascular:     Rate and Rhythm: Normal rate and regular rhythm.     Heart sounds: Normal heart sounds.  Pulmonary:     Effort: Pulmonary effort is normal.     Breath sounds: Normal breath sounds.  Abdominal:     Palpations: Abdomen is soft.  Genitourinary:    Penis: Normal.      Testes: Normal.     Prostate: Normal.     Rectum: Normal.  Lymphadenopathy:     Cervical: No cervical adenopathy.  Skin:    General: Skin is warm and dry.     Comments: There  appears to be healing sebaceous cyst at the base of his neck posteriorly.  No infection and no drainage.  Neurological:     General: No focal deficit present.     Mental Status: He is alert and oriented to person, place, and time.     Comments: Monofilament foot exam is normal.  Psychiatric:        Mood and Affect: Mood normal.        Behavior: Behavior normal.        Thought Content: Thought content normal.        Judgment: Judgment normal.      No results found for any visits on 04/25/19.     Assessment & Plan    1. Type 2 diabetes mellitus without complication, without long-term current use of insulin ([place]) Patient on glipizide and pioglitazone.  Stop glipizide ER if he ever develops any hypoglycemia.  - Hemoglobin A1c - TSH  2. Essential (primary) hypertension On metoprolol.  And losartan. - CBC with Differential/Platelet - Comprehensive metabolic panel - TSH  3. Hypercholesterolemia without hypertriglyceridemia On Lipitor 40 - Lipid Panel With LDL/HDL Ratio  4. CAD in native artery All risk factors treated. - CBC with Differential/Platelet - Comprehensive metabolic panel 5.  Sebaceous cyst of neck Treat with warm compresses.     Richard Cranford Mon, MD  [facility]

## 2019-04-25 ENCOUNTER — Encounter: Payer: Self-pay | Admitting: Family Medicine

## 2019-04-25 ENCOUNTER — Ambulatory Visit (INDEPENDENT_AMBULATORY_CARE_PROVIDER_SITE_OTHER): Payer: Medicare Other | Admitting: Family Medicine

## 2019-04-25 ENCOUNTER — Other Ambulatory Visit: Payer: Self-pay

## 2019-04-25 VITALS — BP 114/72 | HR 60 | Temp 96.9°F | Resp 16 | Ht 67.0 in | Wt 173.2 lb

## 2019-04-25 DIAGNOSIS — L723 Sebaceous cyst: Secondary | ICD-10-CM

## 2019-04-25 DIAGNOSIS — E119 Type 2 diabetes mellitus without complications: Secondary | ICD-10-CM | POA: Diagnosis not present

## 2019-04-25 DIAGNOSIS — I2510 Atherosclerotic heart disease of native coronary artery without angina pectoris: Secondary | ICD-10-CM | POA: Diagnosis not present

## 2019-04-25 DIAGNOSIS — E7800 Pure hypercholesterolemia, unspecified: Secondary | ICD-10-CM

## 2019-04-25 DIAGNOSIS — E559 Vitamin D deficiency, unspecified: Secondary | ICD-10-CM

## 2019-04-25 DIAGNOSIS — I10 Essential (primary) hypertension: Secondary | ICD-10-CM

## 2019-04-26 LAB — COMPREHENSIVE METABOLIC PANEL
ALT: 16 IU/L (ref 0–44)
AST: 22 IU/L (ref 0–40)
Albumin/Globulin Ratio: 2 (ref 1.2–2.2)
Albumin: 4.5 g/dL (ref 3.7–4.7)
Alkaline Phosphatase: 62 IU/L (ref 39–117)
BUN/Creatinine Ratio: 12 (ref 10–24)
BUN: 18 mg/dL (ref 8–27)
Bilirubin Total: 1.5 mg/dL — ABNORMAL HIGH (ref 0.0–1.2)
CO2: 24 mmol/L (ref 20–29)
Calcium: 9.2 mg/dL (ref 8.6–10.2)
Chloride: 101 mmol/L (ref 96–106)
Creatinine, Ser: 1.47 mg/dL — ABNORMAL HIGH (ref 0.76–1.27)
GFR calc Af Amer: 53 mL/min/{1.73_m2} — ABNORMAL LOW (ref >59)
GFR calc non Af Amer: 46 mL/min/{1.73_m2} — ABNORMAL LOW (ref >59)
Globulin, Total: 2.2 g/dL (ref 1.5–4.5)
Glucose: 186 mg/dL — ABNORMAL HIGH (ref 65–99)
Potassium: 4.9 mmol/L (ref 3.5–5.2)
Sodium: 141 mmol/L (ref 134–144)
Total Protein: 6.7 g/dL (ref 6.0–8.5)

## 2019-04-26 LAB — CBC WITH DIFFERENTIAL/PLATELET
Basophils Absolute: 0 10*3/uL (ref 0.0–0.2)
Basos: 0 %
EOS (ABSOLUTE): 0.3 10*3/uL (ref 0.0–0.4)
Eos: 3 %
Hematocrit: 42.8 % (ref 37.5–51.0)
Hemoglobin: 14.3 g/dL (ref 13.0–17.7)
Immature Grans (Abs): 0 10*3/uL (ref 0.0–0.1)
Immature Granulocytes: 0 %
Lymphocytes Absolute: 2.1 10*3/uL (ref 0.7–3.1)
Lymphs: 22 %
MCH: 31.4 pg (ref 26.6–33.0)
MCHC: 33.4 g/dL (ref 31.5–35.7)
MCV: 94 fL (ref 79–97)
Monocytes Absolute: 0.7 10*3/uL (ref 0.1–0.9)
Monocytes: 8 %
Neutrophils Absolute: 6.3 10*3/uL (ref 1.4–7.0)
Neutrophils: 67 %
Platelets: 171 10*3/uL (ref 150–450)
RBC: 4.56 x10E6/uL (ref 4.14–5.80)
RDW: 12.7 % (ref 11.6–15.4)
WBC: 9.5 10*3/uL (ref 3.4–10.8)

## 2019-04-26 LAB — LIPID PANEL WITH LDL/HDL RATIO
Cholesterol, Total: 126 mg/dL (ref 100–199)
HDL: 45 mg/dL (ref >39)
LDL Chol Calc (NIH): 60 mg/dL (ref 0–99)
LDL/HDL Ratio: 1.3 ratio (ref 0.0–3.6)
Triglycerides: 119 mg/dL (ref 0–149)
VLDL Cholesterol Cal: 21 mg/dL (ref 5–40)

## 2019-04-26 LAB — TSH: TSH: 2.05 u[IU]/mL (ref 0.450–4.500)

## 2019-04-26 LAB — HEMOGLOBIN A1C
Est. average glucose Bld gHb Est-mCnc: 192 mg/dL
Hgb A1c MFr Bld: 8.3 % — ABNORMAL HIGH (ref 4.8–5.6)

## 2019-05-21 DIAGNOSIS — L570 Actinic keratosis: Secondary | ICD-10-CM | POA: Diagnosis not present

## 2019-05-21 DIAGNOSIS — L814 Other melanin hyperpigmentation: Secondary | ICD-10-CM | POA: Diagnosis not present

## 2019-05-21 DIAGNOSIS — L720 Epidermal cyst: Secondary | ICD-10-CM | POA: Diagnosis not present

## 2019-05-21 DIAGNOSIS — D485 Neoplasm of uncertain behavior of skin: Secondary | ICD-10-CM | POA: Diagnosis not present

## 2019-05-21 DIAGNOSIS — L300 Nummular dermatitis: Secondary | ICD-10-CM | POA: Diagnosis not present

## 2019-05-22 ENCOUNTER — Other Ambulatory Visit: Payer: Self-pay | Admitting: Family Medicine

## 2019-05-22 DIAGNOSIS — J301 Allergic rhinitis due to pollen: Secondary | ICD-10-CM

## 2019-06-01 ENCOUNTER — Other Ambulatory Visit: Payer: Self-pay | Admitting: Family Medicine

## 2019-06-01 DIAGNOSIS — J301 Allergic rhinitis due to pollen: Secondary | ICD-10-CM

## 2019-06-01 MED ORDER — MONTELUKAST SODIUM 10 MG PO TABS: 10.0000 mg | ORAL_TABLET | Freq: Every day | ORAL | 0 refills | Status: DC

## 2019-06-01 NOTE — Telephone Encounter (Signed)
Pt called in for assistance. Pt says that he is at the pharmacy to pick up refill. Pt says he was told by pharmacy that he doesn't have anymore refills on file and need a new rx please assist pt with  montelukast (SINGULAIR) 10 MG tablet   Pt says that he is going out of town and would like refill as soon as possible.   Pharmacy: [facility] [address], [place] - [place] AT [facility]  [place], [place] [phone]

## 2019-06-02 ENCOUNTER — Other Ambulatory Visit: Payer: Self-pay | Admitting: Family Medicine

## 2019-06-02 DIAGNOSIS — I10 Essential (primary) hypertension: Secondary | ICD-10-CM

## 2019-06-02 NOTE — Telephone Encounter (Signed)
Pt given refill to get to upcoming scheduled appt Requested Prescriptions  Pending Prescriptions Disp Refills  . metoprolol succinate (TOPROL-XL) 25 MG 24 hr tablet [Pharmacy Med Name: METOPROLO ER TAB SUC 25MG ] 90 tablet 0    Sig: TAKE 1 TABLET DAILY     Cardiovascular:  Beta Blockers Failed - 06/02/2019  8:17 AM      Failed - Valid encounter within last 6 months    Recent Outpatient Visits          1 month ago Type 2 diabetes mellitus without complication, without long-term current use of insulin [facility])   [facility] Jerrol Banana., MD   9 months ago Type 2 diabetes mellitus without complication, without long-term current use of insulin [facility])   [facility] Jerrol Banana., MD   1 year ago Sinusitis, unspecified chronicity, unspecified location   [facility] Birdie Sons, MD   1 year ago Essential (primary) hypertension   [facility] Jerrol Banana., MD   1 year ago Type 2 diabetes mellitus without complication, without long-term current use of insulin [facility])   [facility] Jerrol Banana., MD      Future Appointments            In 2 months Jerrol Banana., MD [facility]           Passed - Last BP in normal range    BP Readings from Last 1 Encounters:  04/25/19 114/72         Passed - Last Heart Rate in normal range    Pulse Readings from Last 1 Encounters:  04/25/19 60

## 2019-06-18 NOTE — Progress Notes (Signed)
Established patient visit  I,Carl Dominguez,acting as a scribe for Carl Durie, MD.,have documented all relevant documentation on the behalf of Carl Durie, MD,as directed by  Carl Durie, MD while in the presence of Carl Durie, MD.    Patient: Carl Dominguez   DOB: 10-Oct-1945   74 y.o. Male  MRN: [id number] Visit Date: 06/21/2019  Today's healthcare provider: [name], MD   Chief Complaint  Patient presents with  . Follow-up  . Diabetes   Subjective    HPI  Overall patient is doing well.  He has no complaints.   he is taking medications as prescribed.  He is trying to remain active. Diabetes Mellitus Type II, Follow-up  Lab Results  Component Value Date   HGBA1C 8.3 (H) 04/25/2019   HGBA1C 7.8 (A) 08/21/2018   HGBA1C 8.3 (H) 03/27/2018   Last seen for diabetes 2 months ago.  Management since then includes;Patient on glipizide and pioglitazone. Advised to stop glipizide ER if he ever develops any hypoglycemia. Marland Kitchen He reports good compliance with treatment. He is having side effects. Elevated blood sugars Home blood sugar records: fasting range: 180-163  Episodes of hypoglycemia? No    Current insulin regiment: n/a Most Recent Eye Exam: January 15, 2019 at [facility] Current exercise: walking Current diet habits: well balanced  Pertinent Labs: Lab Results  Component Value Date   CHOL 126 04/25/2019   HDL 45 04/25/2019   LDLCALC 60 04/25/2019   TRIG 119 04/25/2019   Lab Results  Component Value Date   NA 141 04/25/2019   K 4.9 04/25/2019   CL 101 04/25/2019   CO2 24 04/25/2019   GLUCOSE 186 (H) 04/25/2019   BUN 18 04/25/2019   CREATININE 1.47 (H) 04/25/2019   CALCIUM 9.2 04/25/2019   GFRNONAA 46 (L) 04/25/2019   GFRAA 53 (L) 04/25/2019     Wt Readings from Last 3 Encounters:  06/21/19 171 lb (77.6 kg)  04/25/19 173 lb 3.2 oz (78.6 kg)  08/21/18 175 lb (79.4 kg)     --------------------------------------------------------------------     Medications: Outpatient Medications Prior to Visit  Medication Sig  . aspirin 81 MG tablet Take 81 mg by mouth daily.   Marland Kitchen atorvastatin (LIPITOR) 40 MG tablet Take 1 tablet (40 mg total) by mouth at bedtime.  . cetirizine (ZYRTEC) 10 MG tablet TAKE 1 TABLET BY MOUTH ONCE DAILY  . cholecalciferol (VITAMIN D) 1000 UNITS tablet Take 1,000 Units by mouth daily.   . cholestyramine (QUESTRAN) 4 GM/DOSE powder Take one 4 gram scoop daily.  No other medication 4 hours before or after.  . famotidine (PEPCID) 20 MG tablet Take 20 mg by mouth daily.   . fluticasone (FLONASE) 50 MCG/ACT nasal spray Place 2 sprays into both nostrils daily. (Patient taking differently: Place 2 sprays into both nostrils daily. As needed)  . glipiZIDE (GLUCOTROL) 10 MG tablet TAKE 1 TABLET DAILY  . glucose blood (FREESTYLE TEST STRIPS) test strip TEST BLOOD SUGAR ONCE DAILY AND IF NEEDED  . ibuprofen (ADVIL,MOTRIN) 200 MG tablet Take 400 mg by mouth every morning.   . Lactobacillus-Inulin (PROBIOTIC DIGESTIVE SUPPORT PO) Take 1 capsule by mouth daily.  . Lancets (FREESTYLE) lancets USE AS DIRECTED  . latanoprost (XALATAN) 0.005 % ophthalmic solution Place 1 drop into both eyes at bedtime.   Marland Kitchen losartan (COZAAR) 50 MG tablet TAKE 1 TABLET BY MOUTH EVERY DAY  . metoprolol succinate (TOPROL-XL) 25 MG 24 hr tablet TAKE 1  TABLET DAILY  . montelukast (SINGULAIR) 10 MG tablet Take 1 tablet (10 mg total) by mouth at bedtime.  . pioglitazone (ACTOS) 45 MG tablet TAKE 1 TABLET DAILY  . psyllium (METAMUCIL SMOOTH TEXTURE) 28 % packet Take 1 packet by mouth 2 (two) times daily.   Marland Kitchen triamcinolone cream (KENALOG) 0.1 % APPLY TO AFFECTED AREA QD TO BID  . vitamin C (ASCORBIC ACID) 500 MG tablet Take 500 mg by mouth daily.    No facility-administered medications prior to visit.    Review of Systems  Constitutional: Negative for appetite change, chills and  fever.  HENT: Negative.   Eyes: Negative.   Respiratory: Negative for chest tightness, shortness of breath and wheezing.   Cardiovascular: Negative for chest pain and palpitations.  Gastrointestinal: Negative for abdominal pain, nausea and vomiting.  Endocrine: Negative.   Genitourinary: Negative.   Allergic/Immunologic: Negative.   Hematological: Negative.   Psychiatric/Behavioral: Negative.        Objective    BP 109/65 (BP Location: Left Arm, Patient Position: Sitting, Cuff Size: Large)   Pulse 62   Temp (!) 96.9 F (36.1 C) (Other (Comment))   Resp 16   Ht 5\' 7"  (1.702 m)   Wt 171 lb (77.6 kg)   SpO2 97%   BMI 26.78 kg/m    Physical Exam Vitals reviewed.  Constitutional:      Appearance: Normal appearance. He is well-developed.  HENT:     Head: Normocephalic and atraumatic.     Right Ear: External ear normal.     Left Ear: External ear normal.     Nose: Nose normal.  Eyes:     General: No scleral icterus.    Conjunctiva/sclera: Conjunctivae normal.  Neck:     Thyroid: No thyromegaly.     Vascular: No carotid bruit.  Cardiovascular:     Rate and Rhythm: Normal rate and regular rhythm.     Pulses: Normal pulses.     Heart sounds: Normal heart sounds.  Pulmonary:     Effort: Pulmonary effort is normal.     Breath sounds: Normal breath sounds.  Abdominal:     Palpations: Abdomen is soft.  Genitourinary:    Penis: Normal.      Testes: Normal.     Prostate: Normal.     Rectum: Normal.  Musculoskeletal:     Cervical back: Neck supple.     Right lower leg: No edema.     Left lower leg: No edema.  Lymphadenopathy:     Cervical: No cervical adenopathy.  Skin:    General: Skin is warm and dry.  Neurological:     General: No focal deficit present.     Mental Status: He is alert and oriented to person, place, and time.     Comments: Monofilament foot exam is normal.  Psychiatric:        Mood and Affect: Mood normal.        Behavior: Behavior normal.         Thought Content: Thought content normal.        Judgment: Judgment normal.       No results found for any visits on 06/21/19.   Assessment & Plan    1. Type 2 diabetes mellitus without complication, without long-term current use of insulin ([facility]) Try Metformin 500 mg daily.  Patient does not remember dietary counseling which occurred many years ago.  Refer to chronic care management for dietary counseling for diabetes.  Restart Metformin. Last A1c was  8.32 months ago.  Follow-up 2 months. - Ambulatory referral to Chronic Care Management Services - metFORMIN (GLUCOPHAGE-XR) 500 MG 24 hr tablet; Take 1 tablet (500 mg total) by mouth daily with breakfast.  Dispense: 30 tablet; Refill: 1 2. CAD in native artery All risk factors treated  3. Gastro-esophageal reflux disease without esophagitis   4. Other hyperlipidemia On Lipitor 40   No follow-ups on file.         Javyn Havlin Cranford Mon, MD  [facility] [phone] (phone) [phone] (fax)  [place]

## 2019-06-21 ENCOUNTER — Encounter: Payer: Self-pay | Admitting: Family Medicine

## 2019-06-21 ENCOUNTER — Telehealth: Payer: Self-pay

## 2019-06-21 ENCOUNTER — Other Ambulatory Visit: Payer: Self-pay

## 2019-06-21 ENCOUNTER — Other Ambulatory Visit: Payer: Self-pay | Admitting: Family Medicine

## 2019-06-21 ENCOUNTER — Ambulatory Visit (INDEPENDENT_AMBULATORY_CARE_PROVIDER_SITE_OTHER): Payer: Medicare Other | Admitting: Family Medicine

## 2019-06-21 VITALS — BP 109/65 | HR 62 | Temp 96.9°F | Resp 16 | Ht 67.0 in | Wt 171.0 lb

## 2019-06-21 DIAGNOSIS — E7849 Other hyperlipidemia: Secondary | ICD-10-CM

## 2019-06-21 DIAGNOSIS — E119 Type 2 diabetes mellitus without complications: Secondary | ICD-10-CM

## 2019-06-21 DIAGNOSIS — I2510 Atherosclerotic heart disease of native coronary artery without angina pectoris: Secondary | ICD-10-CM

## 2019-06-21 DIAGNOSIS — K219 Gastro-esophageal reflux disease without esophagitis: Secondary | ICD-10-CM | POA: Diagnosis not present

## 2019-06-21 MED ORDER — METFORMIN HCL ER 500 MG PO TB24: 500.0000 mg | ORAL_TABLET | Freq: Every day | ORAL | 1 refills | Status: DC

## 2019-06-21 NOTE — Telephone Encounter (Signed)
For now yes

## 2019-06-21 NOTE — Telephone Encounter (Signed)
   Notes to clinic:  Patient requests 90 days supply Script was sent with 30 day and 1 refill Requested Prescriptions  Pending Prescriptions Disp Refills   metFORMIN (GLUCOPHAGE-XR) 500 MG 24 hr tablet [Pharmacy Med Name: METFORMIN ER '500MG'$  24HR TABS] 90 tablet     Sig: TAKE 1 TABLET(500 MG) BY MOUTH DAILY WITH BREAKFAST      Endocrinology:  Diabetes - Biguanides Failed - 06/21/2019  9:05 AM      Failed - Cr in normal range and within 360 days    Creatinine, Ser  Date Value Ref Range Status  04/25/2019 1.47 (H) 0.76 - 1.27 mg/dL Final          Failed - HBA1C is between 0 and 7.9 and within 180 days    Hgb A1c MFr Bld  Date Value Ref Range Status  04/25/2019 8.3 (H) 4.8 - 5.6 % Final    Comment:             Prediabetes: 5.7 - 6.4          Diabetes: >6.4          Glycemic control for adults with diabetes: <7.0           Failed - eGFR in normal range and within 360 days    GFR calc Af Amer  Date Value Ref Range Status  04/25/2019 53 (L) >59 mL/min/1.73 Final   GFR calc non Af Amer  Date Value Ref Range Status  04/25/2019 46 (L) >59 mL/min/1.73 Final          Passed - Valid encounter within last 6 months    Recent Outpatient Visits           Today Type 2 diabetes mellitus without complication, without long-term current use of insulin [facility])   [facility] Jerrol Banana., MD   1 month ago Type 2 diabetes mellitus without complication, without long-term current use of insulin [facility])   [facility] Jerrol Banana., MD   10 months ago Type 2 diabetes mellitus without complication, without long-term current use of insulin [facility])   [facility] Jerrol Banana., MD   1 year ago Sinusitis, unspecified chronicity, unspecified location   [facility] Birdie Sons, MD   1 year ago Essential (primary) hypertension   [facility] Jerrol Banana., MD       Future Appointments              In 2 months Jerrol Banana., MD [facility] [place]

## 2019-06-21 NOTE — Telephone Encounter (Signed)
Copied from [place] [id number]. Topic: General - Inquiry >> Jun 21, 2019  2:25 PM Greggory Keen D wrote: Reason for CRM: Pt was in the office this morning and see Dr. Rosanna Randy.  Pt is unsure if he is continue taking the Glipizide with the other medications that Dr. Rosanna Randy added. CB# [phone]

## 2019-06-22 ENCOUNTER — Telehealth: Payer: Self-pay | Admitting: Family Medicine

## 2019-06-22 NOTE — Telephone Encounter (Signed)
Patient advised.

## 2019-06-22 NOTE — Chronic Care Management (AMB) (Signed)
  Chronic Care Management   Note  06/22/2019 Name: OSSIE BELTRAN MRN: [id number] DOB: January 03, 1946  DARRIS STAIGER is a 74 y.o. year old male who is a primary care patient of Jerrol Banana., MD. I reached out to [name] by phone today in response to a referral sent by Mr. Shadeed Colberg Mullinix's PCP, Dr. Miguel Aschoff     Mr. Albea was given information about Chronic Care Management services today including:  1. CCM service includes personalized support from designated clinical staff supervised by his physician, including individualized plan of care and coordination with other care providers 2. 24/7 contact phone numbers for assistance for urgent and routine care needs. 3. Service will only be billed when office clinical staff spend 20 minutes or more in a month to coordinate care. 4. Only one practitioner may furnish and bill the service in a calendar month. 5. The patient may stop CCM services at any time (effective at the end of the month) by phone call to the office staff. 6. The patient will be responsible for cost sharing (co-pay) of up to 20% of the service fee (after annual deductible is met).  Patient agreed to services and verbal consent obtained.   Follow up plan: Telephone appointment with care management team member scheduled for:07/03/2019  Glenna Durand, LPN Health Advisor, [facility] ??[email or web address] ??[phone]

## 2019-06-29 ENCOUNTER — Other Ambulatory Visit: Payer: Self-pay | Admitting: Family Medicine

## 2019-06-29 DIAGNOSIS — J302 Other seasonal allergic rhinitis: Secondary | ICD-10-CM

## 2019-07-02 ENCOUNTER — Other Ambulatory Visit: Payer: Self-pay | Admitting: Family Medicine

## 2019-07-02 DIAGNOSIS — E119 Type 2 diabetes mellitus without complications: Secondary | ICD-10-CM

## 2019-07-02 MED ORDER — FREESTYLE LANCETS MISC: 12 refills | Status: DC

## 2019-07-02 NOTE — Telephone Encounter (Signed)
Copied from [place] [id number]. Topic: Quick Communication - Rx Refill/Question >> Jul 02, 2019 11:57 AM Rainey Pines A wrote: Medication: Lancets (FREESTYLE) lancets (Patient is completely out of medication and pharmacy is waiting on prescription to be sent back over.)  Has the patient contacted their pharmacy? yes (Agent: If no, request that the patient contact the pharmacy for the refill.) (Agent: If yes, when and what did the pharmacy advise?)Contact PCP  Preferred Pharmacy (with phone number or street name): [facility] [id number], [place] [facility]  Phone:  [phone] Fax:  [phone]     Agent: Please be advised that RX refills may take up to 3 business days. We ask that you follow-up with your pharmacy.

## 2019-07-03 ENCOUNTER — Ambulatory Visit (INDEPENDENT_AMBULATORY_CARE_PROVIDER_SITE_OTHER): Payer: Medicare Other

## 2019-07-03 DIAGNOSIS — E119 Type 2 diabetes mellitus without complications: Secondary | ICD-10-CM

## 2019-07-03 NOTE — Chronic Care Management (AMB) (Signed)
Chronic Care Management   Initial Visit Note    07/03/2019 Name: Carl Dominguez MRN: [id number] DOB: 1945/03/13  Primary Care Provider: Jerrol Banana., MD Reason for referral : Chronic Care Management   Carl Dominguez is a 74 y.o. year old male who is a primary care patient of Carl Banana., MD. The CCM team was consulted for assistance with chronic disease management and care coordination. A telephonic assessment was conducted today.  Review of Carl Dominguez status, including review of consultants reports, relevant labs and test results was conducted today. Collaboration with appropriate care team members was performed as part of the comprehensive evaluation and provision of chronic care management services.    SDOH (Social Determinants of Health) assessments performed: Yes No additional interventions required at this time.    Medications: Outpatient Encounter Medications as of 07/03/2019  Medication Sig Note  . glipiZIDE (GLUCOTROL) 10 MG tablet TAKE 1 TABLET DAILY   . metFORMIN (GLUCOPHAGE-XR) 500 MG 24 hr tablet TAKE 1 TABLET(500 MG) BY MOUTH DAILY WITH BREAKFAST   . pioglitazone (ACTOS) 45 MG tablet TAKE 1 TABLET DAILY   . aspirin 81 MG tablet Take 81 mg by mouth daily.  11/14/2014: Received from: [facility]  . atorvastatin (LIPITOR) 40 MG tablet Take 1 tablet (40 mg total) by mouth at bedtime.   . cetirizine (ZYRTEC) 10 MG tablet TAKE 1 TABLET BY MOUTH EVERY DAY   . cholecalciferol (VITAMIN D) 1000 UNITS tablet Take 1,000 Units by mouth daily.  11/14/2014: Received from: [facility]  . cholestyramine (QUESTRAN) 4 GM/DOSE powder Take one 4 gram scoop daily.  No other medication 4 hours before or after.   . famotidine (PEPCID) 20 MG tablet Take 20 mg by mouth daily.  11/14/2014: Received from: [facility]  . fluticasone (FLONASE) 50 MCG/ACT nasal spray Place 2 sprays into both nostrils daily. (Patient taking  differently: Place 2 sprays into both nostrils daily. As needed)   . glucose blood (FREESTYLE TEST STRIPS) test strip TEST BLOOD SUGAR ONCE DAILY AND IF NEEDED   . ibuprofen (ADVIL,MOTRIN) 200 MG tablet Take 400 mg by mouth every morning.  11/14/2014: Received from: [facility]  . Lactobacillus-Inulin (PROBIOTIC DIGESTIVE SUPPORT PO) Take 1 capsule by mouth daily.   . Lancets (FREESTYLE) lancets USE AS DIRECTED   . latanoprost (XALATAN) 0.005 % ophthalmic solution Place 1 drop into both eyes at bedtime.  11/14/2014: Received from: [facility]  . losartan (COZAAR) 50 MG tablet TAKE 1 TABLET BY MOUTH EVERY DAY   . metoprolol succinate (TOPROL-XL) 25 MG 24 hr tablet TAKE 1 TABLET DAILY   . montelukast (SINGULAIR) 10 MG tablet Take 1 tablet (10 mg total) by mouth at bedtime.   . psyllium (METAMUCIL SMOOTH TEXTURE) 28 % packet Take 1 packet by mouth 2 (two) times daily.  11/14/2014: Received from: [facility]  . triamcinolone cream (KENALOG) 0.1 % APPLY TO AFFECTED AREA QD TO BID   . vitamin C (ASCORBIC ACID) 500 MG tablet Take 500 mg by mouth daily.     No facility-administered encounter medications on file as of 07/03/2019.     Objective:  BP Readings from Last 3 Encounters:  06/21/19 109/65  04/25/19 114/72  08/21/18 118/68   Lab Results  Component Value Date   HGBA1C 8.3 (H) 04/25/2019   Lab Results  Component Value Date   CHOL 126 04/25/2019   HDL 45 04/25/2019   LDLCALC 60 04/25/2019  TRIG 119 04/25/2019    Goals Addressed            This Visit's Progress   . Chronic Disease Management       CARE PLAN ENTRY (see longitudinal plan of care for additional care plan information)  Current Barriers: . Chronic Disease Management support and education needs related to Hypertension, Hyperlipidemia, Diabetes and GERD.  Case Manager Clinical Goal(s):  Marland Kitchen Over the next 90 days, patient will attend all scheduled medical  appointments.  . Over the next 90 days, patient will take all medications as prescribed. . Over the next 90 days, patient will monitor blood glucose daily and maintain a log. . Over the next 90 days, patient will continue to engaged in mild exercises and activities as tolerated. . Over the next 90 days, patient will follow safety recommendations to prevent falls. . Over the next 60 days, patient will document daily nutritional intake. . Over the next 30 days, patient will notify team if he requires assistance with obtaining needed medical supplies.  Interventions:  . Inter-disciplinary care team collaboration (see longitudinal plan of care) . Reviewed medications. Encouraged to take all medications as prescribed and notify team with concerns regarding prescription costs. Reports self administering medications. Denies concerns regarding medication management or costs. . Discussed s/sx of hypoglycemia and hyperglycemia along with recommended interventions. Encouraged to monitor blood glucose daily and maintain a log. Reports a fasting range from 130's to 160's over the past week. Reports a reading of '143mg'$ /dl today.  . Discussed current nutritional intake along with cardiac prudent/diabetic options. Encouraged to read nutritional labels and maintain a food journal to identify trends with eating habits. Reports attempting to adhere to recommended diet. Agreeable to maintaining a journal to identify foods that are likely contributing to elevated blood glucose levels. Prefers not to engage with a nutritionist at this time but will consider if A1C does not improve. . Discussed current activity level along with safety measure to prevent falls. Reports regularly engaging in mild activity over the past few weeks. Denies recent changes or decline in activity tolerance. . Reviewed pending provider appointments. Encouraged to attend appointments as scheduled to prevent delays in care. Declines concerns regarding  transportation. . Discussed plans for ongoing care management and follow up. Provided direct contact information for CCM Nurse Case Manager.  Patient Self Care Activities:  . Self administers medications  . Attends scheduled provider appointments . Calls pharmacy for medication refills . Performs ADL's independently . Performs IADL's independently . Calls provider office for new concerns or questions   Initial goal documentation        Mr. Gaddie was given information about Chronic Care Management services including:  1. CCM service includes personalized support from designated clinical staff supervised by his physician, including individualized plan of care and coordination with other care providers 2. 24/7 contact phone numbers for assistance for urgent and routine care needs. 3. Service will only be billed when office clinical staff spend 20 minutes or more in a month to coordinate care. 4. Only one practitioner may furnish and bill the service in a calendar month. 5. The patient may stop CCM services at any time (effective at the end of the month) by phone call to the office staff. 6. The patient will be responsible for cost sharing (co-pay) of up to 20% of the service fee (after annual deductible is met).  Patient agreed to services and verbal consent obtained.     PLAN The care management team  will follow-up with Mr. Spease in three weeks.    Horris Latino [facility] Care Management [phone]

## 2019-07-05 ENCOUNTER — Telehealth: Payer: Self-pay

## 2019-07-05 DIAGNOSIS — E119 Type 2 diabetes mellitus without complications: Secondary | ICD-10-CM

## 2019-07-05 MED ORDER — FREESTYLE LANCETS MISC: 12 refills | Status: DC

## 2019-07-05 NOTE — Telephone Encounter (Signed)
Copied from [place] [id number]. Topic: Quick Communication - Rx Refill/Question >> Jul 02, 2019 11:57 AM Rainey Pines A wrote: Medication: Lancets (FREESTYLE) lancets (Patient is completely out of medication and pharmacy is waiting on prescription to be sent back over.)  Has the patient contacted their pharmacy? yes (Agent: If no, request that the patient contact the pharmacy for the refill.) (Agent: If yes, when and what did the pharmacy advise?)Contact PCP  Preferred Pharmacy (with phone number or street name): [facility] [id number], [place] [facility]  Phone:  [phone] Fax:  [phone]     Agent: Please be advised that RX refills may take up to 3 business days. We ask that you follow-up with your pharmacy. >> Jul 05, 2019  9:58 AM Greggory Keen D wrote: [name] with [facility] [address] asking how many tmes he patient testing per day.  CB#  [phone]

## 2019-07-05 NOTE — Telephone Encounter (Signed)
Copied from [place] [id number]. Topic: Quick Communication - Rx Refill/Question >> Jul 02, 2019 11:57 AM Rainey Pines A wrote: Medication: Lancets (FREESTYLE) lancets (Patient is completely out of medication and pharmacy is waiting on prescription to be sent back over.)  Has the patient contacted their pharmacy? yes (Agent: If no, request that the patient contact the pharmacy for the refill.) (Agent: If yes, when and what did the pharmacy advise?)Contact PCP  Preferred Pharmacy (with phone number or street name): [facility] [id number], [place] [facility]  Phone:  [phone] Fax:  [phone]     Agent: Please be advised that RX refills may take up to 3 business days. We ask that you follow-up with your pharmacy.

## 2019-07-05 NOTE — Telephone Encounter (Signed)
[name] advised.

## 2019-07-11 NOTE — Patient Instructions (Addendum)
Thank you for allowing the Chronic Care Management team to participate in your care.  Goals Addressed            This Visit's Progress   . Chronic Disease Management       CARE PLAN ENTRY (see longitudinal plan of care for additional care plan information)  Current Barriers: . Chronic Disease Management support and education needs related to Hypertension, Hyperlipidemia, Diabetes and GERD.  Case Manager Clinical Goal(s):  Marland Kitchen Over the next 90 days, patient will attend all scheduled medical appointments.  . Over the next 90 days, patient will take all medications as prescribed. . Over the next 90 days, patient will monitor blood glucose daily and maintain a log. . Over the next 90 days, patient will continue to engaged in mild exercises and activities as tolerated. . Over the next 90 days, patient will follow safety recommendations to prevent falls. . Over the next 60 days, patient will document daily nutritional intake. . Over the next 30 days, patient will notify team if he requires assistance with obtaining needed medical supplies.  Interventions:  . Inter-disciplinary care team collaboration (see longitudinal plan of care) . Reviewed medications. Encouraged to take all medications as prescribed and notify team with concerns regarding prescription costs. Reports self administering medications. Denies concerns regarding medication management or costs. . Discussed s/sx of hypoglycemia and hyperglycemia along with recommended interventions. Encouraged to monitor blood glucose daily and maintain a log. Reports a fasting range from 130's to 160's over the past week. Reports a reading of '143mg'$ /dl today.  . Discussed current nutritional intake along with cardiac prudent/diabetic options. Encouraged to read nutritional labels and maintain a food journal to identify trends with eating habits. Reports attempting to adhere to recommended diet. Agreeable to maintaining a journal to identify foods  that are likely contributing to elevated blood glucose levels. Prefers not to engage with a nutritionist at this time but will consider if A1C does not improve. . Discussed current activity level along with safety measure to prevent falls. Reports regularly engaging in mild activity over the past few weeks. Denies recent changes or decline in activity tolerance. . Reviewed pending provider appointments. Encouraged to attend appointments as scheduled to prevent delays in care. Declines concerns regarding transportation. . Discussed plans for ongoing care management and follow up. Provided direct contact information for CCM Nurse Case Manager.  Patient Self Care Activities:  . Self administers medications  . Attends scheduled provider appointments . Calls pharmacy for medication refills . Performs ADL's independently . Performs IADL's independently . Calls provider office for new concerns or questions   Initial goal documentation        Carl Dominguez was given information about Chronic Care Management services including:  1. CCM service includes personalized support from designated clinical staff supervised by his physician, including individualized plan of care and coordination with other care providers 2. 24/7 contact phone numbers for assistance for urgent and routine care needs. 3. Service will only be billed when office clinical staff spend 20 minutes or more in a month to coordinate care. 4. Only one practitioner may furnish and bill the service in a calendar month. 5. The patient may stop CCM services at any time (effective at the end of the month) by phone call to the office staff. 6. The patient will be responsible for cost sharing (co-pay) of up to 20% of the service fee (after annual deductible is met).  Patient agreed to services and verbal consent obtained.  Carl Dominguez verbalized understanding of the instructions provided during the telephonic outreach today. Declined need  for a mailed/printed copy of the instructions.   The care management team will follow-up with Carl Dominguez in three weeks.   [facility] Latino [facility] Care Management [phone]

## 2019-07-19 ENCOUNTER — Other Ambulatory Visit: Payer: Self-pay | Admitting: Family Medicine

## 2019-07-19 DIAGNOSIS — E119 Type 2 diabetes mellitus without complications: Secondary | ICD-10-CM

## 2019-07-19 NOTE — Telephone Encounter (Signed)
Requested Prescriptions  Pending Prescriptions Disp Refills  . glucose blood (FREESTYLE TEST STRIPS) test strip [Pharmacy Med Name: FREESTYLE TEST STRIPS 100S] 100 strip 5    Sig: TEST BLOOD SUGAR EVERY DAY AND AS NEEDED     Endocrinology: Diabetes - Testing Supplies Passed - 07/19/2019 10:51 AM      Passed - Valid encounter within last 12 months    Recent Outpatient Visits          4 weeks ago Type 2 diabetes mellitus without complication, without long-term current use of insulin [facility])   [facility] Jerrol Banana., MD   2 months ago Type 2 diabetes mellitus without complication, without long-term current use of insulin [facility])   [facility] Jerrol Banana., MD   11 months ago Type 2 diabetes mellitus without complication, without long-term current use of insulin [facility])   [facility] Jerrol Banana., MD   1 year ago Sinusitis, unspecified chronicity, unspecified location   [facility] Birdie Sons, MD   1 year ago Essential (primary) hypertension   [facility] Jerrol Banana., MD      Future Appointments            In 1 month Jerrol Banana., MD [facility] [place]

## 2019-07-24 ENCOUNTER — Ambulatory Visit: Payer: Medicare Other

## 2019-07-24 DIAGNOSIS — E119 Type 2 diabetes mellitus without complications: Secondary | ICD-10-CM | POA: Diagnosis not present

## 2019-08-01 NOTE — Patient Instructions (Signed)
Thank you for allowing the Chronic Care Management team to participate in your care.   Goals Addressed            This Visit's Progress   . Chronic Disease Management       CARE PLAN ENTRY (see longitudinal plan of care for additional care plan information)  Current Barriers: . Chronic Disease Management support and education needs related to Hypertension, Hyperlipidemia, Diabetes and GERD.  Case Manager Clinical Goal(s):  Marland Kitchen Over the next 90 days, patient will attend all scheduled medical appointments.  . Over the next 90 days, patient will take all medications as prescribed. . Over the next 90 days, patient will monitor blood glucose daily and maintain a log. . Over the next 90 days, patient will continue to engaged in mild exercises and activities as tolerated. . Over the next 90 days, patient will follow safety recommendations to prevent falls. . Over the next 60 days, patient will document daily nutritional intake. . Over the next 30 days, patient will notify team if he requires assistance with obtaining needed medical supplies.  Interventions:  . Inter-disciplinary care team collaboration (see longitudinal plan of care) . Discussed recent blood glucose readings, s/sx of hypoglycemia and hyperglycemia along with recommended interventions.  Reports a fasting range from 120-176 mg/dl over the past two weeks. Reports a reading of 173 mg/dl. . Discussed current nutritional intake along with cardiac prudent/diabetic options. Attempting to monitor nutritional intake. Still prefers not to engage with a nutritionist or dietician.  . Discussed current activity level along with safety measure to prevent falls. Reports minimal activity over the last week. Prefers not to participate in activities the involve muscle strengthening. Encouraged to engage in low impact exercises as tolerated.  Patient Self Care Activities:  . Self administers medications  . Attends scheduled provider  appointments . Calls pharmacy for medication refills . Performs ADL's independently . Performs IADL's independently . Calls provider office for new concerns or questions   Please see past updates related to this goal by clicking on the "Past Updates" button in the selected goal         Mr. Wackerman verbalized understanding of the instructions provided during the telephonic outreach today. Declined need for mailed/printed instructions.   The care management team will follow-up within the next month.    [facility]ris Latino [facility] Care Management [phone]

## 2019-08-01 NOTE — Chronic Care Management (AMB) (Signed)
Chronic Care Management   Follow Up Note    Name: Carl Dominguez MRN: [id number] DOB: 08/29/1945  Primary Care Provider: Jerrol Banana., MD Reason for referral : Chronic Care Management   Carl Dominguez is a 74 y.o. year old male who is a primary care patient of Jerrol Banana., MD. He is currently engaged with the chronic care management team. A routine telephonic outreach was conducted today.  Review of Carl Dominguez status, including review of consultants reports, relevant labs and test results was conducted today. Collaboration with appropriate care team members was performed as part of the comprehensive evaluation and provision of chronic care management services.    SDOH (Social Determinants of Health) assessments performed: No     Outpatient Encounter Medications as of 07/24/2019  Medication Sig Note  . aspirin 81 MG tablet Take 81 mg by mouth daily.  11/14/2014: Received from: [facility]  . atorvastatin (LIPITOR) 40 MG tablet Take 1 tablet (40 mg total) by mouth at bedtime.   . cetirizine (ZYRTEC) 10 MG tablet TAKE 1 TABLET BY MOUTH EVERY DAY   . cholecalciferol (VITAMIN D) 1000 UNITS tablet Take 1,000 Units by mouth daily.  11/14/2014: Received from: [facility]  . cholestyramine (QUESTRAN) 4 GM/DOSE powder Take one 4 gram scoop daily.  No other medication 4 hours before or after.   . famotidine (PEPCID) 20 MG tablet Take 20 mg by mouth daily.  11/14/2014: Received from: [facility]  . fluticasone (FLONASE) 50 MCG/ACT nasal spray Place 2 sprays into both nostrils daily. (Patient taking differently: Place 2 sprays into both nostrils daily. As needed)   . glipiZIDE (GLUCOTROL) 10 MG tablet TAKE 1 TABLET DAILY   . glucose blood (FREESTYLE TEST STRIPS) test strip TEST BLOOD SUGAR EVERY DAY AND AS NEEDED   . ibuprofen (ADVIL,MOTRIN) 200 MG tablet Take 400 mg by mouth every morning.  11/14/2014: Received from:  [facility]  . Lactobacillus-Inulin (PROBIOTIC DIGESTIVE SUPPORT PO) Take 1 capsule by mouth daily.   . Lancets (FREESTYLE) lancets USE AS DIRECTED   . latanoprost (XALATAN) 0.005 % ophthalmic solution Place 1 drop into both eyes at bedtime.  11/14/2014: Received from: [facility]  . losartan (COZAAR) 50 MG tablet TAKE 1 TABLET BY MOUTH EVERY DAY   . metFORMIN (GLUCOPHAGE-XR) 500 MG 24 hr tablet TAKE 1 TABLET(500 MG) BY MOUTH DAILY WITH BREAKFAST   . metoprolol succinate (TOPROL-XL) 25 MG 24 hr tablet TAKE 1 TABLET DAILY   . montelukast (SINGULAIR) 10 MG tablet Take 1 tablet (10 mg total) by mouth at bedtime.   . pioglitazone (ACTOS) 45 MG tablet TAKE 1 TABLET DAILY   . psyllium (METAMUCIL SMOOTH TEXTURE) 28 % packet Take 1 packet by mouth 2 (two) times daily.  11/14/2014: Received from: [facility]  . triamcinolone cream (KENALOG) 0.1 % APPLY TO AFFECTED AREA QD TO BID   . vitamin C (ASCORBIC ACID) 500 MG tablet Take 500 mg by mouth daily.     No facility-administered encounter medications on file as of 07/24/2019.     Objective:  Lab Results  Component Value Date   HGBA1C 8.3 (H) 04/25/2019    Goals Addressed            This Visit's Progress   . Chronic Disease Management       CARE PLAN ENTRY (see longitudinal plan of care for additional care plan information)  Current Barriers: . Chronic Disease Management support  and education needs related to Hypertension, Hyperlipidemia, Diabetes and GERD.  Case Manager Clinical Goal(s):  Marland Kitchen Over the next 90 days, patient will attend all scheduled medical appointments.  . Over the next 90 days, patient will take all medications as prescribed. . Over the next 90 days, patient will monitor blood glucose daily and maintain a log. . Over the next 90 days, patient will continue to engaged in mild exercises and activities as tolerated. . Over the next 90 days, patient will follow safety  recommendations to prevent falls. . Over the next 60 days, patient will document daily nutritional intake. . Over the next 30 days, patient will notify team if he requires assistance with obtaining needed medical supplies.  Interventions:  . Inter-disciplinary care team collaboration (see longitudinal plan of care) . Discussed recent blood glucose readings, s/sx of hypoglycemia and hyperglycemia along with recommended interventions.  Reports a fasting range from 120-176 mg/dl over the past two weeks. Reports a reading of 173 mg/dl. . Discussed current nutritional intake along with cardiac prudent/diabetic options. Attempting to monitor nutritional intake. Still prefers not to engage with a nutritionist or dietician.  . Discussed current activity level along with safety measure to prevent falls. Reports minimal activity over the last week. Prefers not to participate in activities the involve muscle strengthening. Encouraged to engage in low impact exercises as tolerated.  Patient Self Care Activities:  . Self administers medications  . Attends scheduled provider appointments . Calls pharmacy for medication refills . Performs ADL's independently . Performs IADL's independently . Calls provider office for new concerns or questions   Please see past updates related to this goal by clicking on the "Past Updates" button in the selected goal          PLAN The care management team will reach out to Mr. Panzica again within the next month.    Horris Latino [facility] Care Management [phone]

## 2019-08-07 ENCOUNTER — Ambulatory Visit: Payer: Medicare Other

## 2019-08-07 ENCOUNTER — Telehealth: Payer: Self-pay

## 2019-08-14 NOTE — Chronic Care Management (AMB) (Signed)
  Chronic Care Management   Note   Name: Carl Dominguez MRN: [id number] DOB: 1945/12/15   Care Coordination Only: Heart Health, Diabetes and Nutritional resources mailed.   Follow up plan: The care management team will follow-up with [name] as scheduled later this month.        Horris Latino [facility] Care Management [phone]

## 2019-08-21 ENCOUNTER — Ambulatory Visit (INDEPENDENT_AMBULATORY_CARE_PROVIDER_SITE_OTHER): Payer: Medicare Other

## 2019-08-21 DIAGNOSIS — E119 Type 2 diabetes mellitus without complications: Secondary | ICD-10-CM | POA: Diagnosis not present

## 2019-08-21 NOTE — Chronic Care Management (AMB) (Signed)
Chronic Care Management   Follow Up Note   08/21/2019 Name: Carl Dominguez MRN: [id number] DOB: [birth year]  Primary Care Provider: Jerrol Banana., MD Reason for referral : Chronic Care Management   Carl Dominguez is a 74 y.o. year old male who is a primary care patient of Jerrol Banana., MD. He is currently engaged with the chronic care management team. A routine outreach was conducted today.  Review of Carl Dominguez  status, including review of consultants reports, relevant labs and test results was conducted today. Collaboration with appropriate care team members was performed as part of the comprehensive evaluation and provision of chronic care management services.     SDOH (Social Determinants of Health) assessments performed: No     Outpatient Encounter Medications as of 08/21/2019  Medication Sig Note  . aspirin 81 MG tablet Take 81 mg by mouth daily.  11/14/2014: Received from: [facility]  . atorvastatin (LIPITOR) 40 MG tablet Take 1 tablet (40 mg total) by mouth at bedtime.   . cetirizine (ZYRTEC) 10 MG tablet TAKE 1 TABLET BY MOUTH EVERY DAY   . cholecalciferol (VITAMIN D) 1000 UNITS tablet Take 1,000 Units by mouth daily.  11/14/2014: Received from: [facility]  . cholestyramine (QUESTRAN) 4 GM/DOSE powder Take one 4 gram scoop daily.  No other medication 4 hours before or after.   . famotidine (PEPCID) 20 MG tablet Take 20 mg by mouth daily.  11/14/2014: Received from: [facility]  . fluticasone (FLONASE) 50 MCG/ACT nasal spray Place 2 sprays into both nostrils daily. (Patient taking differently: Place 2 sprays into both nostrils daily. As needed)   . glipiZIDE (GLUCOTROL) 10 MG tablet TAKE 1 TABLET DAILY   . glucose blood (FREESTYLE TEST STRIPS) test strip TEST BLOOD SUGAR EVERY DAY AND AS NEEDED   . ibuprofen (ADVIL,MOTRIN) 200 MG tablet Take 400 mg by mouth every morning.  11/14/2014: Received from:  [facility]  . Lactobacillus-Inulin (PROBIOTIC DIGESTIVE SUPPORT PO) Take 1 capsule by mouth daily.   . Lancets (FREESTYLE) lancets USE AS DIRECTED   . latanoprost (XALATAN) 0.005 % ophthalmic solution Place 1 drop into both eyes at bedtime.  11/14/2014: Received from: [facility]  . losartan (COZAAR) 50 MG tablet TAKE 1 TABLET BY MOUTH EVERY DAY   . metFORMIN (GLUCOPHAGE-XR) 500 MG 24 hr tablet TAKE 1 TABLET(500 MG) BY MOUTH DAILY WITH BREAKFAST   . metoprolol succinate (TOPROL-XL) 25 MG 24 hr tablet TAKE 1 TABLET DAILY   . montelukast (SINGULAIR) 10 MG tablet Take 1 tablet (10 mg total) by mouth at bedtime.   . pioglitazone (ACTOS) 45 MG tablet TAKE 1 TABLET DAILY   . psyllium (METAMUCIL SMOOTH TEXTURE) 28 % packet Take 1 packet by mouth 2 (two) times daily.  11/14/2014: Received from: [facility]  . triamcinolone cream (KENALOG) 0.1 % APPLY TO AFFECTED AREA QD TO BID   . vitamin C (ASCORBIC ACID) 500 MG tablet Take 500 mg by mouth daily.     No facility-administered encounter medications on file as of 08/21/2019.     Goals Addressed            This Visit's Progress   . Chronic Disease Management       CARE PLAN ENTRY (see longitudinal plan of care for additional care plan information)  Current Barriers: . Chronic Disease Management support and education needs related to Hypertension, Hyperlipidemia, Diabetes and GERD.  Case Manager Clinical Goal(s):  .  Over the next 90 days, patient will attend all scheduled medical appointments.  . Over the next 90 days, patient will take all medications as prescribed. . Over the next 90 days, patient will monitor blood glucose daily and maintain a log. . Over the next 90 days, patient will continue to engaged in mild exercises and activities as tolerated. . Over the next 90 days, patient will follow safety recommendations to prevent falls. . Over the next 60 days, patient will document daily  nutritional intake. . Over the next 30 days, patient will notify team if he requires assistance with obtaining needed medical supplies.  Interventions:  . Inter-disciplinary care team collaboration (see longitudinal plan of care) . Discussed compliance with medications and recent blood glucose readings  Reports highest reading was 163 mg/dl over the weekend but most readings have ranged in the 130's. Remains compliant with medications. . Discussed current nutritional intake along with cardiac prudent/diabetic options. Reports significant improvement with nutritional intake and adherence to diet recommendations. Verbalized several options and "food swaps" that he recently implemented. His spouse is very supportive and assisting him with meeting his goals. . Discussed current activity level. Denies falls and continues to follow recommended safety measures. Continues to work in his garden and engages in mild/low impact activities.   . Discussed long term plan for chronic care management. Carl Dominguez is doing very well. He is very motivated to decrease his A1C and achieve his care management goals. He will complete labs and a PCP follow-up later this week. The care management team will plan to follow-up within the next month to discuss progress and update goals.    Patient Self Care Activities:  . Self administers medications  . Attends scheduled provider appointments . Calls pharmacy for medication refills . Performs ADL's independently . Performs IADL's independently . Calls provider office for new concerns or questions   Please see past updates related to this goal by clicking on the "Past Updates" button in the selected goal           PLAN  The care management team will follow-up within the next month.   Carl Dominguez [facility] Care [facility] [phone]

## 2019-08-22 ENCOUNTER — Other Ambulatory Visit: Payer: Self-pay | Admitting: Family Medicine

## 2019-08-22 DIAGNOSIS — J301 Allergic rhinitis due to pollen: Secondary | ICD-10-CM

## 2019-08-22 NOTE — Progress Notes (Signed)
Trena Platt Cummings,acting as a scribe for [name], MD.,have documented all relevant documentation on the behalf of [name], MD,as directed by  [name], MD while in the presence of [name], MD.  Established patient visit   Patient: Carl Dominguez   DOB: [birth year]   74 y.o. Male  MRN: [id number] Visit Date: 08/23/2019  Today's healthcare provider: [name], MD   Chief Complaint  Patient presents with  . Diabetes Mellitus   Subjective    HPI  Patient feels well with no complaints today.  He has had both Covid vaccines. Diabetes Mellitus Type II, follow-up  Lab Results  Component Value Date   HGBA1C 8.3 (H) 04/25/2019   HGBA1C 7.8 (A) 08/21/2018   HGBA1C 8.3 (H) 03/27/2018   Last seen for diabetes 2 months ago.       Management since then includes; Try Metformin 500 mg daily.  Patient does not remember dietary counseling which occurred many years ago.  Refer to chronic care management for dietary counseling for diabetes.  Restart Metformin. Last A1c was 8.32 months ago. He reports excellent compliance with treatment. He is not having side effects.   Home blood sugar records: fasting range: 131  Episodes of hypoglycemia? No    Current insulin regiment: none Most Recent Eye Exam: scheduled for August 2021  ---------------------------------------------------------------------------------------------------       Medications: Outpatient Medications Prior to Visit  Medication Sig  . aspirin 81 MG tablet Take 81 mg by mouth daily.   Marland Kitchen atorvastatin (LIPITOR) 40 MG tablet Take 1 tablet (40 mg total) by mouth at bedtime.  . cetirizine (ZYRTEC) 10 MG tablet TAKE 1 TABLET BY MOUTH EVERY DAY  . cholecalciferol (VITAMIN D) 1000 UNITS tablet Take 1,000 Units by mouth daily.   . cholestyramine (QUESTRAN) 4 GM/DOSE powder Take one 4 gram scoop daily.  No other medication 4 hours before or after.  . famotidine (PEPCID) 20 MG  tablet Take 20 mg by mouth daily.   . fluticasone (FLONASE) 50 MCG/ACT nasal spray Place 2 sprays into both nostrils daily. (Patient taking differently: Place 2 sprays into both nostrils daily. As needed)  . glipiZIDE (GLUCOTROL) 10 MG tablet TAKE 1 TABLET DAILY  . glucose blood (FREESTYLE TEST STRIPS) test strip TEST BLOOD SUGAR EVERY DAY AND AS NEEDED  . ibuprofen (ADVIL,MOTRIN) 200 MG tablet Take 400 mg by mouth every morning.   . Lactobacillus-Inulin (PROBIOTIC DIGESTIVE SUPPORT PO) Take 1 capsule by mouth daily.  . Lancets (FREESTYLE) lancets USE AS DIRECTED  . latanoprost (XALATAN) 0.005 % ophthalmic solution Place 1 drop into both eyes at bedtime.   Marland Kitchen losartan (COZAAR) 50 MG tablet TAKE 1 TABLET BY MOUTH EVERY DAY  . metFORMIN (GLUCOPHAGE-XR) 500 MG 24 hr tablet TAKE 1 TABLET(500 MG) BY MOUTH DAILY WITH BREAKFAST  . metoprolol succinate (TOPROL-XL) 25 MG 24 hr tablet TAKE 1 TABLET DAILY  . montelukast (SINGULAIR) 10 MG tablet TAKE 1 TABLET(10 MG) BY MOUTH AT BEDTIME  . pioglitazone (ACTOS) 45 MG tablet TAKE 1 TABLET DAILY  . psyllium (METAMUCIL SMOOTH TEXTURE) 28 % packet Take 1 packet by mouth 2 (two) times daily.   Marland Kitchen triamcinolone cream (KENALOG) 0.1 % APPLY TO AFFECTED AREA QD TO BID  . vitamin C (ASCORBIC ACID) 500 MG tablet Take 500 mg by mouth daily.    No facility-administered medications prior to visit.    Review of Systems  Constitutional: Negative for appetite change, chills and fever.  Eyes: Negative.   Respiratory: Negative for chest tightness, shortness of breath and wheezing.   Cardiovascular: Negative for chest pain and palpitations.  Gastrointestinal: Negative for abdominal pain, nausea and vomiting.  Endocrine: Negative.   Allergic/Immunologic: Negative.   Neurological: Negative.   Hematological: Negative.   Psychiatric/Behavioral: Negative.     Last hemoglobin A1c Lab Results  Component Value Date   HGBA1C 7.0 (A) 08/23/2019      Objective    BP  112/69 (BP Location: Right Arm, Patient Position: Sitting, Cuff Size: Normal)   Pulse 65   Temp (!) 96.9 F (36.1 C) (Temporal)   Ht 5\' 7"  (1.702 m)   Wt 166 lb 3.2 oz (75.4 kg)   BMI 26.03 kg/m  BP Readings from Last 3 Encounters:  08/23/19 112/69  06/21/19 109/65  04/25/19 114/72   Wt Readings from Last 3 Encounters:  08/23/19 166 lb 3.2 oz (75.4 kg)  06/21/19 171 lb (77.6 kg)  04/25/19 173 lb 3.2 oz (78.6 kg)      Physical Exam Vitals reviewed.  Constitutional:      Appearance: Normal appearance. He is well-developed.  HENT:     Head: Normocephalic and atraumatic.     Right Ear: External ear normal.     Left Ear: External ear normal.     Nose: Nose normal.  Eyes:     General: No scleral icterus.    Conjunctiva/sclera: Conjunctivae normal.  Neck:     Thyroid: No thyromegaly.     Vascular: No carotid bruit.  Cardiovascular:     Rate and Rhythm: Normal rate and regular rhythm.     Pulses: Normal pulses.     Heart sounds: Normal heart sounds.  Pulmonary:     Effort: Pulmonary effort is normal.     Breath sounds: Normal breath sounds.  Abdominal:     Palpations: Abdomen is soft.  Musculoskeletal:     Cervical back: Neck supple.     Right lower leg: No edema.     Left lower leg: No edema.  Lymphadenopathy:     Cervical: No cervical adenopathy.  Skin:    General: Skin is warm and dry.  Neurological:     General: No focal deficit present.     Mental Status: He is alert and oriented to person, place, and time.     Comments: Monofilament foot exam is normal.  Psychiatric:        Mood and Affect: Mood normal.        Behavior: Behavior normal.        Thought Content: Thought content normal.        Judgment: Judgment normal.       No results found for any visits on 08/23/19.  Assessment & Plan     1. Type 2 diabetes mellitus without complication, without long-term current use of insulin (HCC) A1c is 7.0.  Good control of diabetes with Metformin and  glipizide and Actos.  Consider stopping glipizide in the future - POCT HgB A1C  2. CAD in native artery All risk factors treated.  3. Essential (primary) hypertension Controlled on losartan and metoprolol  4. Hypercholesterolemia without hypertriglyceridemia On atorvastatin.   No follow-ups on file.      I, [name], MD, have reviewed all documentation for this visit. The documentation on 08/31/19 for the exam, diagnosis, procedures, and orders are all accurate and complete.    Samanthamarie Ezzell Cranford Mon, MD  [facility] [phone] (phone) [phone] (fax)  [facility]

## 2019-08-23 ENCOUNTER — Telehealth: Payer: Self-pay

## 2019-08-23 ENCOUNTER — Other Ambulatory Visit: Payer: Self-pay

## 2019-08-23 ENCOUNTER — Ambulatory Visit (INDEPENDENT_AMBULATORY_CARE_PROVIDER_SITE_OTHER): Payer: Medicare Other | Admitting: Family Medicine

## 2019-08-23 ENCOUNTER — Encounter: Payer: Self-pay | Admitting: Family Medicine

## 2019-08-23 VITALS — BP 112/69 | HR 65 | Temp 96.9°F | Ht 67.0 in | Wt 166.2 lb

## 2019-08-23 DIAGNOSIS — I10 Essential (primary) hypertension: Secondary | ICD-10-CM | POA: Diagnosis not present

## 2019-08-23 DIAGNOSIS — E119 Type 2 diabetes mellitus without complications: Secondary | ICD-10-CM

## 2019-08-23 DIAGNOSIS — E7800 Pure hypercholesterolemia, unspecified: Secondary | ICD-10-CM

## 2019-08-23 DIAGNOSIS — I2510 Atherosclerotic heart disease of native coronary artery without angina pectoris: Secondary | ICD-10-CM | POA: Diagnosis not present

## 2019-08-23 LAB — POCT GLYCOSYLATED HEMOGLOBIN (HGB A1C)
Estimated Average Glucose: 154
Hemoglobin A1C: 7 % — AB (ref 4.0–5.6)

## 2019-08-23 MED ORDER — METFORMIN HCL ER 500 MG PO TB24: ORAL_TABLET | ORAL | 3 refills | Status: DC

## 2019-08-23 NOTE — Telephone Encounter (Signed)
Copied from [place] [id number]. Topic: General - Other >> Aug 23, 2019  9:52 AM Rainey Pines A wrote: Patient had appointment today and forgot to ask Dr. Rosanna Randy for clariication on if he should continue taking the metformin, Patient said that he has 27 pills left . Please advise

## 2019-08-23 NOTE — Telephone Encounter (Signed)
Patient advised that Dr. Rosanna Randy would like him to continue taking metformin, patient expressed verbal understanding. Patient requested that his refill be sent to [facility].

## 2019-08-23 NOTE — Telephone Encounter (Signed)
Yes he should.  Please send in a years worth of refills.  90 days at a time is fine.

## 2019-08-28 NOTE — Patient Instructions (Signed)
Thank you for allowing the Chronic Care Management team to participate in your care.  Goals Addressed            This Visit's Progress   . Chronic Disease Management       CARE PLAN ENTRY (see longitudinal plan of care for additional care plan information)  Current Barriers: . Chronic Disease Management support and education needs related to Hypertension, Hyperlipidemia, Diabetes and GERD.  Case Manager Clinical Goal(s):  Marland Kitchen Over the next 90 days, patient will attend all scheduled medical appointments.  . Over the next 90 days, patient will take all medications as prescribed. . Over the next 90 days, patient will monitor blood glucose daily and maintain a log. . Over the next 90 days, patient will continue to engaged in mild exercises and activities as tolerated. . Over the next 90 days, patient will follow safety recommendations to prevent falls. . Over the next 60 days, patient will document daily nutritional intake. . Over the next 30 days, patient will notify team if he requires assistance with obtaining needed medical supplies.  Interventions:  . Inter-disciplinary care team collaboration (see longitudinal plan of care) . Discussed compliance with medications and recent blood glucose readings  Reports highest reading was 163 mg/dl over the weekend but most readings have ranged in the 130's. Remains compliant with medications. . Discussed current nutritional intake along with cardiac prudent/diabetic options. Reports significant improvement with nutritional intake and adherence to diet recommendations. Verbalized several options and "food swaps" that he recently implemented. His spouse is very supportive and assisting him with meeting his goals. . Discussed current activity level. Denies falls and continues to follow recommended safety measures. Continues to work in his garden and engages in mild/low impact activities.   . Discussed long term plan for chronic care management. Mr  Brideau is doing very well. He is very motivated to decrease his A1C and achieve his care management goals. He will complete labs and a PCP follow-up later this week. The care management team will plan to follow-up within the next month to discuss progress and update goals.    Patient Self Care Activities:  . Self administers medications  . Attends scheduled provider appointments . Calls pharmacy for medication refills . Performs ADL's independently . Performs IADL's independently . Calls provider office for new concerns or questions   Please see past updates related to this goal by clicking on the "Past Updates" button in the selected goal         Mr. Shular verbalized understanding of the information discussed during the telephonic outreach today. Declined need for a mailed/printed copy of the instructions.   The care management team will follow-up with Mr. Laswell within the next month.   Horris Latino [facility] [phone]

## 2019-08-31 ENCOUNTER — Other Ambulatory Visit: Payer: Self-pay | Admitting: Family Medicine

## 2019-08-31 DIAGNOSIS — I10 Essential (primary) hypertension: Secondary | ICD-10-CM

## 2019-09-25 ENCOUNTER — Ambulatory Visit: Payer: Medicare Other

## 2019-09-26 ENCOUNTER — Other Ambulatory Visit: Payer: Self-pay

## 2019-09-26 ENCOUNTER — Encounter: Payer: Self-pay | Admitting: Family Medicine

## 2019-09-26 ENCOUNTER — Ambulatory Visit (INDEPENDENT_AMBULATORY_CARE_PROVIDER_SITE_OTHER): Payer: Medicare Other | Admitting: Family Medicine

## 2019-09-26 ENCOUNTER — Ambulatory Visit: Payer: Medicare Other | Admitting: Adult Health

## 2019-09-26 ENCOUNTER — Encounter: Payer: Self-pay | Admitting: Adult Health

## 2019-09-26 VITALS — BP 127/64 | HR 62 | Temp 97.1°F | Wt 167.8 lb

## 2019-09-26 DIAGNOSIS — S39012A Strain of muscle, fascia and tendon of lower back, initial encounter: Secondary | ICD-10-CM

## 2019-09-26 DIAGNOSIS — I2510 Atherosclerotic heart disease of native coronary artery without angina pectoris: Secondary | ICD-10-CM

## 2019-09-26 DIAGNOSIS — M25559 Pain in unspecified hip: Secondary | ICD-10-CM | POA: Insufficient documentation

## 2019-09-26 DIAGNOSIS — M25529 Pain in unspecified elbow: Secondary | ICD-10-CM | POA: Insufficient documentation

## 2019-09-26 DIAGNOSIS — W19XXXA Unspecified fall, initial encounter: Secondary | ICD-10-CM | POA: Insufficient documentation

## 2019-09-26 LAB — POCT URINALYSIS DIPSTICK
Bilirubin, UA: NEGATIVE
Blood, UA: NEGATIVE
Glucose, UA: NEGATIVE
Ketones, UA: NEGATIVE
Leukocytes, UA: NEGATIVE
Nitrite, UA: NEGATIVE
Protein, UA: NEGATIVE
Spec Grav, UA: 1.02 (ref 1.010–1.025)
Urobilinogen, UA: 0.2 E.U./dL
pH, UA: 6 (ref 5.0–8.0)

## 2019-09-26 MED ORDER — CYCLOBENZAPRINE HCL 10 MG PO TABS: 10.0000 mg | ORAL_TABLET | Freq: Every day | ORAL | 0 refills | Status: DC | PRN

## 2019-09-26 MED ORDER — NAPROXEN 500 MG PO TABS: 500.0000 mg | ORAL_TABLET | Freq: Two times a day (BID) | ORAL | 0 refills | Status: DC | PRN

## 2019-09-26 NOTE — Progress Notes (Deleted)
° ° ° °  Subjective:    Carl Dominguez is a 74 y.o. male who presents for evaluation of low back pain. The patient has had {history; pain back:5285::"recurrent self limited episodes of low back pain in the past"}. Symptoms have been present for {1-10:13787} {units:19031} and are {clinical course - history:17::"unchanged"}.  Onset was related to / precipitated by {causes; back pain:32249::"no known injury"}. The pain is located in the {back pain location:31199} and {radiation:20410}. The pain is described as {pain quality:31200} and occurs {timing:31009}. {Pain rating:20411} Symptoms are exacerbated by {causes; aggravators pain back:31424}. Symptoms are improved by {pain treatments:32172}. He has also tried {pain treatments:32172} which provided no symptom relief. He has {back pain associated symptoms neuro:31426} associated with the back pain. {red flag Hx:20412}  {Common ambulatory SmartLinks:19316}  Review of Systems {ros - complete:30496}    Objective:   {Exam; back exam:5796::"Full range of motion without pain, no tenderness, no spasm, no curvature.","Normal reflexes, gait, strength and negative straight-leg raise."}    Assessment:    {back diagnosis:16452}    Plan:    {Plan; back pain:10213}

## 2019-09-26 NOTE — Progress Notes (Signed)
Established patient visit   Patient: Carl Dominguez   DOB: April 01, 1945   74 y.o. Male  MRN: [id number] Visit Date: 09/26/2019  Today's healthcare provider: [name], MD   Chief Complaint  Patient presents with  . Back Pain   Subjective    Back Pain This is a new problem. The current episode started in the past 7 days. The problem occurs constantly. Radiates to: right hip. The pain is at a severity of 7/10. The pain is the same all the time. The symptoms are aggravated by sitting and standing. Pertinent negatives include no abdominal pain, chest pain, dysuria, fever, headaches, leg pain, numbness, pelvic pain or tingling. Treatments tried: 500 mg equate tylenol. The treatment provided no relief.  Patient fell on his right side 1 year ago but is okay from that.  1 week ago he was doing some heavy labor and after that had a lot of low back pain.  No radicular symptoms.  He has a lot of back spasm when he stands up and moves about.       Medications: Outpatient Medications Prior to Visit  Medication Sig  . aspirin 81 MG tablet Take 81 mg by mouth daily.   Marland Kitchen atorvastatin (LIPITOR) 40 MG tablet Take 1 tablet (40 mg total) by mouth at bedtime.  . cetirizine (ZYRTEC) 10 MG tablet TAKE 1 TABLET BY MOUTH EVERY DAY  . cholecalciferol (VITAMIN D) 1000 UNITS tablet Take 1,000 Units by mouth daily.   . cholestyramine (QUESTRAN) 4 GM/DOSE powder Take one 4 gram scoop daily.  No other medication 4 hours before or after.  . famotidine (PEPCID) 20 MG tablet Take 20 mg by mouth daily.   . fluticasone (FLONASE) 50 MCG/ACT nasal spray Place 2 sprays into both nostrils daily. (Patient taking differently: Place 2 sprays into both nostrils daily. As needed)  . glipiZIDE (GLUCOTROL) 10 MG tablet TAKE 1 TABLET DAILY  . glucose blood (FREESTYLE TEST STRIPS) test strip TEST BLOOD SUGAR EVERY DAY AND AS NEEDED  . ibuprofen (ADVIL,MOTRIN) 200 MG tablet Take 400 mg by mouth every morning.   .  Lactobacillus-Inulin (PROBIOTIC DIGESTIVE SUPPORT PO) Take 1 capsule by mouth daily.  . Lancets (FREESTYLE) lancets USE AS DIRECTED  . latanoprost (XALATAN) 0.005 % ophthalmic solution Place 1 drop into both eyes at bedtime.   Marland Kitchen losartan (COZAAR) 50 MG tablet TAKE 1 TABLET BY MOUTH EVERY DAY  . metFORMIN (GLUCOPHAGE-XR) 500 MG 24 hr tablet TAKE 1 TABLET(500 MG) BY MOUTH DAILY WITH BREAKFAST  . metoprolol succinate (TOPROL-XL) 25 MG 24 hr tablet TAKE 1 TABLET DAILY  . montelukast (SINGULAIR) 10 MG tablet TAKE 1 TABLET(10 MG) BY MOUTH AT BEDTIME  . pioglitazone (ACTOS) 45 MG tablet TAKE 1 TABLET DAILY  . psyllium (METAMUCIL SMOOTH TEXTURE) 28 % packet Take 1 packet by mouth 2 (two) times daily.   Marland Kitchen triamcinolone cream (KENALOG) 0.1 % APPLY TO AFFECTED AREA QD TO BID  . vitamin C (ASCORBIC ACID) 500 MG tablet Take 500 mg by mouth daily.    No facility-administered medications prior to visit.    Review of Systems  Constitutional: Negative for fever.  Cardiovascular: Negative for chest pain.  Gastrointestinal: Negative for abdominal pain.  Genitourinary: Negative for dysuria and pelvic pain.  Musculoskeletal: Positive for back pain.  Neurological: Negative.  Negative for tingling, numbness and headaches.  Psychiatric/Behavioral: Negative.   All other systems reviewed and are negative.      Objective  BP (!) 127/64 (BP Location: Right Arm, Patient Position: Sitting, Cuff Size: Normal)   Pulse 62   Temp (!) 97.1 F (36.2 C) (Temporal)   Wt 167 lb 12.8 oz (76.1 kg)   BMI 26.28 kg/m  BP Readings from Last 3 Encounters:  09/26/19 (!) 127/64  08/23/19 112/69  06/21/19 109/65   Wt Readings from Last 3 Encounters:  09/26/19 167 lb 12.8 oz (76.1 kg)  08/23/19 166 lb 3.2 oz (75.4 kg)  06/21/19 171 lb (77.6 kg)      Physical Exam Vitals reviewed.  Constitutional:      Appearance: Normal appearance. He is well-developed.  HENT:     Head: Normocephalic and atraumatic.     Right  Ear: External ear normal.     Left Ear: External ear normal.     Nose: Nose normal.  Eyes:     General: No scleral icterus.    Conjunctiva/sclera: Conjunctivae normal.  Neck:     Thyroid: No thyromegaly.     Vascular: No carotid bruit.  Cardiovascular:     Rate and Rhythm: Normal rate and regular rhythm.     Pulses: Normal pulses.     Heart sounds: Normal heart sounds.  Pulmonary:     Effort: Pulmonary effort is normal.     Breath sounds: Normal breath sounds.  Abdominal:     Palpations: Abdomen is soft.  Musculoskeletal:     Cervical back: Neck supple.     Right lower leg: No edema.     Left lower leg: No edema.     Comments: Straight leg raise is negative.  No muscular weakness in lower extremities.  Lymphadenopathy:     Cervical: No cervical adenopathy.  Skin:    General: Skin is warm and dry.  Neurological:     General: No focal deficit present.     Mental Status: He is alert and oriented to person, place, and time.  Psychiatric:        Mood and Affect: Mood normal.        Behavior: Behavior normal.        Thought Content: Thought content normal.        Judgment: Judgment normal.       No results found for any visits on 09/26/19.  Assessment & Plan     1. Strain of lumbar region, initial encounter I think this is almost all musculoskeletal pain with muscle spasm.  Treat with naproxen and Flexeril and local heat.  May need prednisone but will hold off on that because of his diabetes.  No imaging at this time. - naproxen (NAPROSYN) 500 MG tablet; Take 1 tablet (500 mg total) by mouth 2 (two) times daily as needed.  Dispense: 30 tablet; Refill: 0 - cyclobenzaprine (FLEXERIL) 10 MG tablet; Take 1 tablet (10 mg total) by mouth daily as needed for muscle spasms.  Dispense: 30 tablet; Refill: 0 - POCT urinalysis dipstick - diazepam (VALIUM) 2 MG tablet; Take 1 tablet (2 mg total) by mouth every 8 (eight) hours as needed for anxiety.  Dispense: 30 tablet; Refill: 1   No  follow-ups on file.         Willies Laviolette Cranford Mon, MD  [facility] [phone] (phone) [phone] (fax)  [place]

## 2019-09-26 NOTE — Progress Notes (Deleted)
Acute Office Visit  Subjective:    Patient ID: Carl Dominguez, male    DOB: Jan 27, 1946, 74 y.o.   MRN: [id number]  No chief complaint on file.   HPI Patient is in today for ***   Past Medical History:  Diagnosis Date   Diabetes mellitus without complication ([place])    Heart disease    Hyperlipidemia    Hypertension    Myocardial infarction [facility]) 1999    Past Surgical History:  Procedure Laterality Date   CATARACT EXTRACTION W/PHACO Right 03/24/2016   Procedure: CATARACT EXTRACTION PHACO AND INTRAOCULAR LENS PLACEMENT ([place])  Right;  Surgeon: Leandrew Koyanagi, MD;  Location: [place];  Service: Ophthalmology;  Laterality: Right;  Diabetic - oral meds right eye   CHOLECYSTECTOMY  2010   CORONARY ANGIOPLASTY WITH STENT PLACEMENT  1999   HERNIA REPAIR      History reviewed. No pertinent family history.  Social History   Socioeconomic History   Marital status: Married    Spouse name: Not on file   Number of children: 2   Years of education: Not on file   Highest education level: Some college, no degree  Occupational History   Occupation: retired  Tobacco Use   Smoking status: Former Smoker    Types: Cigarettes    Quit date: 03/01/1982    Years since quitting: 37.5   Smokeless tobacco: Never Used   Tobacco comment: has been quit for 30 years  Vaping Use   Vaping Use: Never used  Substance and Sexual Activity   Alcohol use: Yes    Comment: occasionally- 1 beer   Drug use: No   Sexual activity: Not on file  Other Topics Concern   Not on file  Social History Narrative   Not on file   Social Determinants of Health   Financial Resource Strain: Low Risk    Difficulty of Paying Living Expenses: Not hard at all  Food Insecurity: No Food Insecurity   Worried About Charity fundraiser in the Last Year: Never true   [place] in the Last Year: Never true  Transportation Needs: No Transportation Needs   Lack of  Transportation (Medical): No   Lack of Transportation (Non-Medical): No  Physical Activity: Inactive   Days of Exercise per Week: 0 days   Minutes of Exercise per Session: 0 min  Stress: No Stress Concern Present   Feeling of Stress : Not at all  Social Connections: Moderately Integrated   Frequency of Communication with Friends and Family: More than three times a week   Frequency of Social Gatherings with Friends and Family: Three times a week   Attends Religious Services: More than 4 times per year   Active Member of Clubs or Organizations: No   Attends Archivist Meetings: Never   Marital Status: Married  Human resources officer Violence: Not At Risk   Fear of Current or Ex-Partner: No   Emotionally Abused: No   Physically Abused: No   Sexually Abused: No    Outpatient Medications Prior to Visit  Medication Sig Dispense Refill   aspirin 81 MG tablet Take 81 mg by mouth daily.      atorvastatin (LIPITOR) 40 MG tablet Take 1 tablet (40 mg total) by mouth at bedtime. 90 tablet 1   cetirizine (ZYRTEC) 10 MG tablet TAKE 1 TABLET BY MOUTH EVERY DAY 30 tablet 11   cholecalciferol (VITAMIN D) 1000 UNITS tablet Take 1,000 Units by mouth daily.  cholestyramine (QUESTRAN) 4 GM/DOSE powder Take one 4 gram scoop daily.  No other medication 4 hours before or after.     famotidine (PEPCID) 20 MG tablet Take 20 mg by mouth daily.      fluticasone (FLONASE) 50 MCG/ACT nasal spray Place 2 sprays into both nostrils daily. (Patient taking differently: Place 2 sprays into both nostrils daily. As needed) 16 g 12   glipiZIDE (GLUCOTROL) 10 MG tablet TAKE 1 TABLET DAILY 90 tablet 1   glucose blood (FREESTYLE TEST STRIPS) test strip TEST BLOOD SUGAR EVERY DAY AND AS NEEDED 100 strip 5   ibuprofen (ADVIL,MOTRIN) 200 MG tablet Take 400 mg by mouth every morning.      Lactobacillus-Inulin (PROBIOTIC DIGESTIVE SUPPORT PO) Take 1 capsule by mouth daily.     Lancets (FREESTYLE)  lancets USE AS DIRECTED 100 each 12   latanoprost (XALATAN) 0.005 % ophthalmic solution Place 1 drop into both eyes at bedtime.      losartan (COZAAR) 50 MG tablet TAKE 1 TABLET BY MOUTH EVERY DAY 90 tablet 3   metFORMIN (GLUCOPHAGE-XR) 500 MG 24 hr tablet TAKE 1 TABLET(500 MG) BY MOUTH DAILY WITH BREAKFAST 90 tablet 3   metoprolol succinate (TOPROL-XL) 25 MG 24 hr tablet TAKE 1 TABLET DAILY 90 tablet 1   montelukast (SINGULAIR) 10 MG tablet TAKE 1 TABLET(10 MG) BY MOUTH AT BEDTIME 90 tablet 0   pioglitazone (ACTOS) 45 MG tablet TAKE 1 TABLET DAILY 90 tablet 3   psyllium (METAMUCIL SMOOTH TEXTURE) 28 % packet Take 1 packet by mouth 2 (two) times daily.      triamcinolone cream (KENALOG) 0.1 % APPLY TO AFFECTED AREA QD TO BID     vitamin C (ASCORBIC ACID) 500 MG tablet Take 500 mg by mouth daily.      No facility-administered medications prior to visit.    No Known Allergies  Review of Systems     Objective:    Physical Exam  There were no vitals taken for this visit. Wt Readings from Last 3 Encounters:  08/23/19 166 lb 3.2 oz (75.4 kg)  06/21/19 171 lb (77.6 kg)  04/25/19 173 lb 3.2 oz (78.6 kg)    Health Maintenance Due  Topic Date Due   OPHTHALMOLOGY EXAM  07/17/2019    There are no preventive care reminders to display for this patient.   Lab Results  Component Value Date   TSH 2.050 04/25/2019   Lab Results  Component Value Date   WBC 9.5 04/25/2019   HGB 14.3 04/25/2019   HCT 42.8 04/25/2019   MCV 94 04/25/2019   PLT 171 04/25/2019   Lab Results  Component Value Date   NA 141 04/25/2019   K 4.9 04/25/2019   CO2 24 04/25/2019   GLUCOSE 186 (H) 04/25/2019   BUN 18 04/25/2019   CREATININE 1.47 (H) 04/25/2019   BILITOT 1.5 (H) 04/25/2019   ALKPHOS 62 04/25/2019   AST 22 04/25/2019   ALT 16 04/25/2019   PROT 6.7 04/25/2019   ALBUMIN 4.5 04/25/2019   CALCIUM 9.2 04/25/2019   Lab Results  Component Value Date   CHOL 126 04/25/2019   Lab  Results  Component Value Date   HDL 45 04/25/2019   Lab Results  Component Value Date   LDLCALC 60 04/25/2019   Lab Results  Component Value Date   TRIG 119 04/25/2019   No results found for: [facility] Lab Results  Component Value Date   HGBA1C 7.0 (A) 08/23/2019  Assessment & Plan:   Problem List Items Addressed This Visit    None       No orders of the defined types were placed in this encounter.    Marcille Buffy, FNP

## 2019-09-26 NOTE — Progress Notes (Deleted)
  Subjective:     Patient ID: Carl Dominguez, male   DOB: 06-09-1945, 74 y.o.   MRN: [id number]  HPI   Review of Systems     Objective:   Physical Exam     Assessment:     ***    Plan:     ***

## 2019-09-27 ENCOUNTER — Other Ambulatory Visit: Payer: Self-pay | Admitting: Family Medicine

## 2019-09-27 ENCOUNTER — Telehealth: Payer: Self-pay

## 2019-09-27 DIAGNOSIS — E119 Type 2 diabetes mellitus without complications: Secondary | ICD-10-CM

## 2019-09-27 NOTE — Telephone Encounter (Signed)
Patient would like it noted he's experiencing discomfort and would like this request expedited best contact # [phone] [facility] Phone)

## 2019-09-27 NOTE — Telephone Encounter (Signed)
Copied from [place] [id number]. Topic: General - Inquiry >> Sep 27, 2019  8:57 AM Hinda Lenis D wrote: Insurance will not cover this medication / cyclobenzaprine (FLEXERIL) 10 MG tablet [[id number]] / please advise

## 2019-09-28 ENCOUNTER — Telehealth: Payer: Self-pay

## 2019-09-28 MED ORDER — DIAZEPAM 2 MG PO TABS: 2.0000 mg | ORAL_TABLET | Freq: Three times a day (TID) | ORAL | 1 refills | Status: DC | PRN

## 2019-09-28 NOTE — Telephone Encounter (Signed)
A PA has been started for this. We are waiting for reply.

## 2019-09-28 NOTE — Telephone Encounter (Signed)
I sent in a new prescription

## 2019-09-28 NOTE — Telephone Encounter (Signed)
Per Dr. Rosanna Randy he sent in a different medication.

## 2019-09-28 NOTE — Telephone Encounter (Signed)
Copied from [place] [id number]. Topic: General - Call Back - No Documentation >> Sep 27, 2019  4:43 PM Carl Dominguez wrote: Reason for CRM: Patient needs paper faxed to insurance company or [facility] for authorization of muscle relaxer. Please call patient back when issue is resolved.

## 2019-10-01 NOTE — Telephone Encounter (Signed)
FYI-the second medication you sent in was not covered either.  He states he is feeling some better and right now will hold off any any other medicine.  If he worsens he will try to find out what his insurance will cover

## 2019-10-02 NOTE — Chronic Care Management (AMB) (Signed)
  Chronic Care Management   Note   Name: Carl Dominguez MRN: [id number] DOB: June 17, 1945   Attempted a routine follow-up outreach with [name] today. Reports resting due to acute back pain. Denies falls. Does not recall recent injuries or movements that may have caused the pain. Reports his spouse is in the home and available to assist as needed. He prefers to complete the follow-up at a later date. Verbalized awareness of indications for seeking immediate medical attention if his symptoms worsen prior to his clinic visit. He is scheduled for an acute PCP visit tomorrow.    Follow up plan: -Will follow-up within the next week.    Horris Latino [facility] Care Management [phone]

## 2019-10-09 DIAGNOSIS — M5441 Lumbago with sciatica, right side: Secondary | ICD-10-CM | POA: Diagnosis not present

## 2019-10-09 DIAGNOSIS — M546 Pain in thoracic spine: Secondary | ICD-10-CM | POA: Diagnosis not present

## 2019-10-09 DIAGNOSIS — M9902 Segmental and somatic dysfunction of thoracic region: Secondary | ICD-10-CM | POA: Diagnosis not present

## 2019-10-09 DIAGNOSIS — M955 Acquired deformity of pelvis: Secondary | ICD-10-CM | POA: Diagnosis not present

## 2019-10-09 DIAGNOSIS — M9905 Segmental and somatic dysfunction of pelvic region: Secondary | ICD-10-CM | POA: Diagnosis not present

## 2019-10-09 DIAGNOSIS — M9903 Segmental and somatic dysfunction of lumbar region: Secondary | ICD-10-CM | POA: Diagnosis not present

## 2019-10-17 ENCOUNTER — Ambulatory Visit: Payer: Self-pay | Admitting: Adult Health

## 2019-10-23 DIAGNOSIS — M955 Acquired deformity of pelvis: Secondary | ICD-10-CM | POA: Diagnosis not present

## 2019-10-23 DIAGNOSIS — M5441 Lumbago with sciatica, right side: Secondary | ICD-10-CM | POA: Diagnosis not present

## 2019-10-23 DIAGNOSIS — M9903 Segmental and somatic dysfunction of lumbar region: Secondary | ICD-10-CM | POA: Diagnosis not present

## 2019-10-23 DIAGNOSIS — M546 Pain in thoracic spine: Secondary | ICD-10-CM | POA: Diagnosis not present

## 2019-10-23 DIAGNOSIS — M9902 Segmental and somatic dysfunction of thoracic region: Secondary | ICD-10-CM | POA: Diagnosis not present

## 2019-10-23 DIAGNOSIS — M9905 Segmental and somatic dysfunction of pelvic region: Secondary | ICD-10-CM | POA: Diagnosis not present

## 2019-11-04 ENCOUNTER — Other Ambulatory Visit: Payer: Self-pay | Admitting: Family Medicine

## 2019-11-04 NOTE — Telephone Encounter (Signed)
Requested Prescriptions  Pending Prescriptions Disp Refills  . losartan (COZAAR) 50 MG tablet [Pharmacy Med Name: LOSARTAN 50MG  TABLETS] 90 tablet 3    Sig: TAKE 1 TABLET BY MOUTH EVERY DAY     Cardiovascular:  Angiotensin Receptor Blockers Failed - 11/04/2019 11:42 AM      Failed - Cr in normal range and within 180 days    Creatinine, Ser  Date Value Ref Range Status  04/25/2019 1.47 (H) 0.76 - 1.27 mg/dL Final         Failed - K in normal range and within 180 days    Potassium  Date Value Ref Range Status  04/25/2019 4.9 3.5 - 5.2 mmol/L Final         Passed - Patient is not pregnant      Passed - Last BP in normal range    BP Readings from Last 1 Encounters:  09/26/19 (!) 127/64         Passed - Valid encounter within last 6 months    Recent Outpatient Visits          1 month ago Strain of lumbar region, initial encounter   [facility] Jerrol Banana., MD   2 months ago Type 2 diabetes mellitus without complication, without long-term current use of insulin ([place])   [facility] Jerrol Banana., MD   4 months ago Type 2 diabetes mellitus without complication, without long-term current use of insulin [facility])   [facility] Jerrol Banana., MD   6 months ago Type 2 diabetes mellitus without complication, without long-term current use of insulin ([place])   [facility] Jerrol Banana., MD   1 year ago Type 2 diabetes mellitus without complication, without long-term current use of insulin [facility])   [facility] Jerrol Banana., MD      Future Appointments            In 1 month Jerrol Banana., MD [facility]           . losartan (COZAAR) 50 MG tablet [Pharmacy Med Name: LOSARTAN 50MG  TABLETS] 90 tablet 3    Sig: TAKE 1 TABLET BY MOUTH EVERY DAY     Cardiovascular:  Angiotensin Receptor Blockers Failed - 11/04/2019 11:42 AM      Failed - Cr in  normal range and within 180 days    Creatinine, Ser  Date Value Ref Range Status  04/25/2019 1.47 (H) 0.76 - 1.27 mg/dL Final         Failed - K in normal range and within 180 days    Potassium  Date Value Ref Range Status  04/25/2019 4.9 3.5 - 5.2 mmol/L Final         Passed - Patient is not pregnant      Passed - Last BP in normal range    BP Readings from Last 1 Encounters:  09/26/19 (!) 127/64         Passed - Valid encounter within last 6 months    Recent Outpatient Visits          1 month ago Strain of lumbar region, initial encounter   [facility] Jerrol Banana., MD   2 months ago Type 2 diabetes mellitus without complication, without long-term current use of insulin [facility])   [facility] Jerrol Banana., MD   4 months ago Type 2 diabetes mellitus without complication,  without long-term current use of insulin [facility])   [facility] Jerrol Banana., MD   6 months ago Type 2 diabetes mellitus without complication, without long-term current use of insulin [facility])   [facility] Jerrol Banana., MD   1 year ago Type 2 diabetes mellitus without complication, without long-term current use of insulin [facility])   [facility] Jerrol Banana., MD      Future Appointments            In 1 month Jerrol Banana., MD [facility], [facility]

## 2019-11-06 ENCOUNTER — Other Ambulatory Visit: Payer: Self-pay | Admitting: Family Medicine

## 2019-11-07 DIAGNOSIS — M9902 Segmental and somatic dysfunction of thoracic region: Secondary | ICD-10-CM | POA: Diagnosis not present

## 2019-11-07 DIAGNOSIS — M546 Pain in thoracic spine: Secondary | ICD-10-CM | POA: Diagnosis not present

## 2019-11-07 DIAGNOSIS — M955 Acquired deformity of pelvis: Secondary | ICD-10-CM | POA: Diagnosis not present

## 2019-11-07 DIAGNOSIS — M9903 Segmental and somatic dysfunction of lumbar region: Secondary | ICD-10-CM | POA: Diagnosis not present

## 2019-11-07 DIAGNOSIS — M5441 Lumbago with sciatica, right side: Secondary | ICD-10-CM | POA: Diagnosis not present

## 2019-11-07 DIAGNOSIS — M9905 Segmental and somatic dysfunction of pelvic region: Secondary | ICD-10-CM | POA: Diagnosis not present

## 2019-11-08 ENCOUNTER — Other Ambulatory Visit: Payer: Self-pay | Admitting: Family Medicine

## 2019-11-08 DIAGNOSIS — I10 Essential (primary) hypertension: Secondary | ICD-10-CM

## 2019-11-08 NOTE — Telephone Encounter (Signed)
[facility] faxed refill request for the following medications:  losartan (COZAAR) 50 MG tablet   Please advise.  Thanks, [employer]

## 2019-11-09 MED ORDER — LOSARTAN POTASSIUM 50 MG PO TABS: 50.0000 mg | ORAL_TABLET | Freq: Every day | ORAL | 1 refills | Status: DC

## 2019-11-09 NOTE — Telephone Encounter (Signed)
Refill sent to patient's requested pharmacy.

## 2019-11-18 ENCOUNTER — Other Ambulatory Visit: Payer: Self-pay | Admitting: Family Medicine

## 2019-11-18 DIAGNOSIS — J301 Allergic rhinitis due to pollen: Secondary | ICD-10-CM

## 2019-11-18 NOTE — Telephone Encounter (Signed)
Requested Prescriptions  Pending Prescriptions Disp Refills  . montelukast (SINGULAIR) 10 MG tablet [Pharmacy Med Name: MONTELUKAST 10MG  TABLETS] 90 tablet 3    Sig: TAKE 1 TABLET(10 MG) BY MOUTH AT BEDTIME     Pulmonology:  Leukotriene Inhibitors Passed - 11/18/2019  3:39 AM      Passed - Valid encounter within last 12 months    Recent Outpatient Visits          1 month ago Strain of lumbar region, initial encounter   [facility] Jerrol Banana., MD   2 months ago Type 2 diabetes mellitus without complication, without long-term current use of insulin [facility])   [facility] Jerrol Ban[facility]., MD   5 months ago Type 2 diabetes mellitus without complication, without long-term current use of insulin [facility])   [facility] Jerrol Banana., MD   6 months ago Type 2 diabetes mellitus without complication, without long-term current use of insulin [facility])   [facility] Jerrol Banana., MD   1 year ago Type 2 diabetes mellitus without complication, without long-term current use of insulin [facility])   [facility] Jerrol Banana., MD      Future Appointments            In 1 month Jerrol Banana., MD [facility], [facility]

## 2019-11-20 DIAGNOSIS — H401131 Primary open-angle glaucoma, bilateral, mild stage: Secondary | ICD-10-CM | POA: Diagnosis not present

## 2019-11-20 LAB — HM DIABETES EYE EXAM

## 2019-11-29 ENCOUNTER — Ambulatory Visit (INDEPENDENT_AMBULATORY_CARE_PROVIDER_SITE_OTHER): Payer: Medicare Other

## 2019-11-29 DIAGNOSIS — S39012A Strain of muscle, fascia and tendon of lower back, initial encounter: Secondary | ICD-10-CM

## 2019-11-29 DIAGNOSIS — E119 Type 2 diabetes mellitus without complications: Secondary | ICD-10-CM | POA: Diagnosis not present

## 2019-11-29 NOTE — Chronic Care Management (AMB) (Signed)
Chronic Care Management   Follow Up Note   11/29/2019 Name: Carl Dominguez MRN: [id number] DOB: Jul 14, 1945  Primary Care Provider: Jerrol Banana., MD Reason for referral : Chronic Care Management   Carl Dominguez is a 74 y.o. year old male who is a primary care patient of Carl Banana., MD. He is currently engaged with the chronic care management team. A routine telephonic outreach was conducted today.  Review of Carl Dominguez status, including review of consultants reports, relevant labs and test results was conducted today. Collaboration with appropriate care team members was performed as part of the comprehensive evaluation and provision of chronic care management services.    SDOH (Social Determinants of Health) assessments performed: No    Outpatient Encounter Medications as of 11/29/2019  Medication Sig Note   aspirin 81 MG tablet Take 81 mg by mouth daily.  11/14/2014: Received from: [facility]   atorvastatin (LIPITOR) 40 MG tablet TAKE 1 TABLET AT BEDTIME    cetirizine (ZYRTEC) 10 MG tablet TAKE 1 TABLET BY MOUTH EVERY DAY    cholecalciferol (VITAMIN D) 1000 UNITS tablet Take 1,000 Units by mouth daily.  11/14/2014: Received from: [place] [facility]   cholestyramine (QUESTRAN) 4 GM/DOSE powder Take one 4 gram scoop daily.  No other medication 4 hours before or after.    cyclobenzaprine (FLEXERIL) 10 MG tablet Take 1 tablet (10 mg total) by mouth daily as needed for muscle spasms.    diazepam (VALIUM) 2 MG tablet Take 1 tablet (2 mg total) by mouth every 8 (eight) hours as needed for anxiety.    famotidine (PEPCID) 20 MG tablet Take 20 mg by mouth daily.  11/14/2014: Received from: [place] [facility]   fluticasone (FLONASE) 50 MCG/ACT nasal spray Place 2 sprays into both nostrils daily. (Patient taking differently: Place 2 sprays into both nostrils daily. As needed)    glipiZIDE (GLUCOTROL) 10 MG tablet TAKE 1  TABLET DAILY    glucose blood (FREESTYLE TEST STRIPS) test strip TEST BLOOD SUGAR EVERY DAY AND AS NEEDED    ibuprofen (ADVIL,MOTRIN) 200 MG tablet Take 400 mg by mouth every morning.  11/14/2014: Received from: [place] (PROBIOTIC DIGESTIVE SUPPORT PO) Take 1 capsule by mouth daily.    Lancets (FREESTYLE) lancets USE AS DIRECTED    latanoprost (XALATAN) 0.005 % ophthalmic solution Place 1 drop into both eyes at bedtime.  11/14/2014: Received from: [facility]   losartan (COZAAR) 50 MG tablet TAKE 1 TABLET BY MOUTH EVERY DAY    losartan (COZAAR) 50 MG tablet TAKE 1 TABLET BY MOUTH EVERY DAY    losartan (COZAAR) 50 MG tablet Take 1 tablet (50 mg total) by mouth daily.    metFORMIN (GLUCOPHAGE-XR) 500 MG 24 hr tablet TAKE 1 TABLET(500 MG) BY MOUTH DAILY WITH BREAKFAST    metoprolol succinate (TOPROL-XL) 25 MG 24 hr tablet TAKE 1 TABLET DAILY    montelukast (SINGULAIR) 10 MG tablet TAKE 1 TABLET(10 MG) BY MOUTH AT BEDTIME    naproxen (NAPROSYN) 500 MG tablet Take 1 tablet (500 mg total) by mouth 2 (two) times daily as needed.    pioglitazone (ACTOS) 45 MG tablet TAKE 1 TABLET DAILY    psyllium (METAMUCIL SMOOTH TEXTURE) 28 % packet Take 1 packet by mouth 2 (two) times daily.  11/14/2014: Received from: [place] [facility]   triamcinolone cream (KENALOG) 0.1 % APPLY TO AFFECTED AREA QD TO BID    vitamin C (ASCORBIC ACID)  500 MG tablet Take 500 mg by mouth daily.     No facility-administered encounter medications on file as of 11/29/2019.     Objective:  Lab Results  Component Value Date   HGBA1C 7.0 (A) 08/23/2019     Goals Addressed            This Visit's Progress    Chronic Disease Management       CARE PLAN ENTRY (see longitudinal plan of care for additional care plan information)  Current Barriers:  Chronic Disease Management support and education needs related to Hypertension,  Hyperlipidemia, Diabetes and GERD.  Case Manager Clinical Goal(s):   Over the next 90 days, patient will attend all scheduled medical appointments. -Complete  Over the next 90 days, patient will take all medications as prescribed.-Complete  Over the next 90 days, patient will monitor blood glucose daily and maintain a log.-Complete  Over the next 90 days, patient will continue to engaged in mild exercises and activities as tolerated.-Complete  Over the next 90 days, patient will follow safety recommendations to prevent falls.-Complete  Over the next 60 days, patient will document daily nutritional intake.  Over the next 30 days, patient will notify team if he requires assistance with obtaining needed medical supplies.-Complete  Interventions:   Inter-disciplinary care team collaboration (see longitudinal plan of care)  Discussed compliance with treatments and plan for ongoing care management. Remains compliant with medications, blood glucose monitoring and provider treatment recommendations. No concerns regarding prescription costs. Reports an elevated FBS reading of 140 mg/dl today. Reports most readings have ranged from 102 to 116 mg/dl. A1C was down from 8.3% to 7% in June. He's doing very well with maintaining a diabetic/heart healthy diet. He was previously evaluated for back discomfort and strain. Reports significant improvement since starting chiropractic therapy. Reports good activity tolerance and continues to perform tasks independently. He remains very motivated to reach his health goals. Denies urgent concerns or changes in care management needs. He will continue monthly chiropractic therapy. Scheduled for follow-up PCP outreach on 12/26/19.   Patient Self Care Activities:   Self administers medications   Attends scheduled provider appointments  Calls pharmacy for medication refills  Performs ADL's independently  Performs IADL's independently  Calls provider office for  new concerns or questions   Please see past updates related to this goal by clicking on the "Past Updates" button in the selected goal          PLAN A member of the care management team will follow-up with Carl Dominguez next month.    [facility] Care [facility] [phone]

## 2019-11-30 NOTE — Patient Instructions (Addendum)
Thank you for allowing the Chronic Care Management team to participate in your care.   Goals Addressed            This Visit's Progress   . Chronic Disease Management       CARE PLAN ENTRY (see longitudinal plan of care for additional care plan information)  Current Barriers: . Chronic Disease Management support and education needs related to Hypertension, Hyperlipidemia, Diabetes and GERD.  Case Manager Clinical Goal(s):  Marland Kitchen Over the next 90 days, patient will attend all scheduled medical appointments. -Complete . Over the next 90 days, patient will take all medications as prescribed.-Complete . Over the next 90 days, patient will monitor blood glucose daily and maintain a log.-Complete . Over the next 90 days, patient will continue to engaged in mild exercises and activities as tolerated.-Complete . Over the next 90 days, patient will follow safety recommendations to prevent falls.-Complete . Over the next 60 days, patient will document daily nutritional intake. . Over the next 30 days, patient will notify team if he requires assistance with obtaining needed medical supplies.-Complete  Interventions:  . Inter-disciplinary care team collaboration (see longitudinal plan of care) . Discussed compliance with treatments and plan for ongoing care management. Remains compliant with medications, blood glucose monitoring and provider treatment recommendations. No concerns regarding prescription costs. Reports an elevated FBS reading of 140 mg/dl today. Reports most readings have ranged from 102 to 116 mg/dl. A1C was down from 8.3% to 7% in June. He's doing very well with maintaining a diabetic/heart healthy diet. He was previously evaluated for back discomfort and strain. Reports significant improvement since starting chiropractic therapy. Reports good activity tolerance and continues to perform tasks independently. He remains very motivated to reach his health goals. Denies urgent concerns or  changes in care management needs. He will continue monthly chiropractic therapy. Scheduled for follow-up PCP outreach on 12/26/19.   Patient Self Care Activities:  . Self administers medications  . Attends scheduled provider appointments . Calls pharmacy for medication refills . Performs ADL's independently . Performs IADL's independently . Calls provider office for new concerns or questions   Please see past updates related to this goal by clicking on the "Past Updates" button in the selected goal        Mr. Higinbotham verbalized understanding of the information discussed during the telephonic outreach today. Declined need for a mailed/printed copy of the information.    A member of the care management team will follow-up with Mr. Denz next month.    [facility] Care Management [facility] [phone]

## 2019-12-03 DIAGNOSIS — L814 Other melanin hyperpigmentation: Secondary | ICD-10-CM | POA: Diagnosis not present

## 2019-12-03 DIAGNOSIS — L570 Actinic keratosis: Secondary | ICD-10-CM | POA: Diagnosis not present

## 2019-12-04 DIAGNOSIS — M955 Acquired deformity of pelvis: Secondary | ICD-10-CM | POA: Diagnosis not present

## 2019-12-04 DIAGNOSIS — M5441 Lumbago with sciatica, right side: Secondary | ICD-10-CM | POA: Diagnosis not present

## 2019-12-04 DIAGNOSIS — M9905 Segmental and somatic dysfunction of pelvic region: Secondary | ICD-10-CM | POA: Diagnosis not present

## 2019-12-04 DIAGNOSIS — M546 Pain in thoracic spine: Secondary | ICD-10-CM | POA: Diagnosis not present

## 2019-12-04 DIAGNOSIS — M9903 Segmental and somatic dysfunction of lumbar region: Secondary | ICD-10-CM | POA: Diagnosis not present

## 2019-12-04 DIAGNOSIS — Z23 Encounter for immunization: Secondary | ICD-10-CM | POA: Diagnosis not present

## 2019-12-04 DIAGNOSIS — M9902 Segmental and somatic dysfunction of thoracic region: Secondary | ICD-10-CM | POA: Diagnosis not present

## 2019-12-25 NOTE — Progress Notes (Signed)
Established patient visit   Patient: Carl Dominguez   DOB: Mar 12, 1945   74 y.o. Male  MRN: [id number] Visit Date: 12/26/2019  Today's healthcare provider: [name], MD   Chief Complaint  Patient presents with  . Diabetes  . Hypertension   Subjective    HPI  Overall patient feels well.  He has no complaints.  No hypoglycemia that he knows about.  No chest pain. Diabetes Mellitus Type II, follow-up  Lab Results  Component Value Date   HGBA1C 6.7 (A) 12/26/2019   HGBA1C 7.0 (A) 08/23/2019   HGBA1C 8.3 (H) 04/25/2019   Last seen for diabetes 4 months ago.  Management since then includes; Good control of diabetes with Metformin and glipizide and Actos.  Consider stopping glipizide in the future. He reports good compliance with treatment. He is not having side effects.   Home blood sugar records: trend: stable  Episodes of hypoglycemia? No    Current insulin regiment: none Most Recent Eye Exam: up to date   Hypertension, follow-up  BP Readings from Last 3 Encounters:  12/26/19 131/83  09/26/19 (!) 127/64  08/23/19 112/69   Wt Readings from Last 3 Encounters:  12/26/19 167 lb (75.8 kg)  09/26/19 167 lb 12.8 oz (76.1 kg)  08/23/19 166 lb 3.2 oz (75.4 kg)     He was last seen for hypertension 4 months ago.  BP at that visit was 112/69. Management since that visit includes; Controlled on losartan and metoprolol. He reports good compliance with treatment. He is not having side effects.  He is exercising. He is adherent to low salt diet.   Outside blood pressures are checked occasionally.  He does not smoke.  Use of agents associated with hypertension: none.        Medications: Outpatient Medications Prior to Visit  Medication Sig  . aspirin 81 MG tablet Take 81 mg by mouth daily.   Marland Kitchen atorvastatin (LIPITOR) 40 MG tablet TAKE 1 TABLET AT BEDTIME  . cetirizine (ZYRTEC) 10 MG tablet TAKE 1 TABLET BY MOUTH EVERY DAY  . cholecalciferol (VITAMIN  D) 1000 UNITS tablet Take 1,000 Units by mouth daily.   . cholestyramine (QUESTRAN) 4 GM/DOSE powder Take one 4 gram scoop daily.  No other medication 4 hours before or after.  . cyclobenzaprine (FLEXERIL) 10 MG tablet Take 1 tablet (10 mg total) by mouth daily as needed for muscle spasms. (Patient not taking: Reported on 11/29/2019)  . diazepam (VALIUM) 2 MG tablet Take 1 tablet (2 mg total) by mouth every 8 (eight) hours as needed for anxiety.  . famotidine (PEPCID) 20 MG tablet Take 20 mg by mouth daily.   . fluticasone (FLONASE) 50 MCG/ACT nasal spray Place 2 sprays into both nostrils daily. (Patient taking differently: Place 2 sprays into both nostrils daily. As needed)  . glipiZIDE (GLUCOTROL) 10 MG tablet TAKE 1 TABLET DAILY  . glucose blood (FREESTYLE TEST STRIPS) test strip TEST BLOOD SUGAR EVERY DAY AND AS NEEDED  . ibuprofen (ADVIL,MOTRIN) 200 MG tablet Take 400 mg by mouth every morning.   . Lactobacillus-Inulin (PROBIOTIC DIGESTIVE SUPPORT PO) Take 1 capsule by mouth daily.  . Lancets (FREESTYLE) lancets USE AS DIRECTED  . latanoprost (XALATAN) 0.005 % ophthalmic solution Place 1 drop into both eyes at bedtime.   Marland Kitchen losartan (COZAAR) 50 MG tablet TAKE 1 TABLET BY MOUTH EVERY DAY  . losartan (COZAAR) 50 MG tablet TAKE 1 TABLET BY MOUTH EVERY DAY  . losartan (COZAAR) 50  MG tablet Take 1 tablet (50 mg total) by mouth daily.  . metFORMIN (GLUCOPHAGE-XR) 500 MG 24 hr tablet TAKE 1 TABLET(500 MG) BY MOUTH DAILY WITH BREAKFAST  . metoprolol succinate (TOPROL-XL) 25 MG 24 hr tablet TAKE 1 TABLET DAILY  . montelukast (SINGULAIR) 10 MG tablet TAKE 1 TABLET(10 MG) BY MOUTH AT BEDTIME  . naproxen (NAPROSYN) 500 MG tablet Take 1 tablet (500 mg total) by mouth 2 (two) times daily as needed.  . pioglitazone (ACTOS) 45 MG tablet TAKE 1 TABLET DAILY  . psyllium (METAMUCIL SMOOTH TEXTURE) 28 % packet Take 1 packet by mouth 2 (two) times daily.   Marland Kitchen triamcinolone cream (KENALOG) 0.1 % APPLY TO AFFECTED  AREA QD TO BID  . vitamin C (ASCORBIC ACID) 500 MG tablet Take 500 mg by mouth daily.    No facility-administered medications prior to visit.    Review of Systems  Constitutional: Negative for appetite change, chills and fever.  Respiratory: Negative for chest tightness, shortness of breath and wheezing.   Cardiovascular: Negative for chest pain and palpitations.  Gastrointestinal: Negative for abdominal pain, nausea and vomiting.    Last hemoglobin A1c Lab Results  Component Value Date   HGBA1C 6.7 (A) 12/26/2019      Objective    BP 131/83   Pulse 72   Resp 16   Ht 5\' 7"  (1.702 m)   Wt 167 lb (75.8 kg)   BMI 26.16 kg/m  BP Readings from Last 3 Encounters:  12/26/19 131/83  09/26/19 (!) 127/64  08/23/19 112/69   Wt Readings from Last 3 Encounters:  12/26/19 167 lb (75.8 kg)  09/26/19 167 lb 12.8 oz (76.1 kg)  08/23/19 166 lb 3.2 oz (75.4 kg)      Physical Exam Vitals reviewed.  Constitutional:      Appearance: Normal appearance. He is well-developed.  HENT:     Head: Normocephalic and atraumatic.     Right Ear: External ear normal.     Left Ear: External ear normal.     Nose: Nose normal.  Eyes:     General: No scleral icterus.    Conjunctiva/sclera: Conjunctivae normal.  Neck:     Thyroid: No thyromegaly.     Vascular: No carotid bruit.  Cardiovascular:     Rate and Rhythm: Normal rate and regular rhythm.     Pulses: Normal pulses.     Heart sounds: Normal heart sounds.  Pulmonary:     Effort: Pulmonary effort is normal.     Breath sounds: Normal breath sounds.  Abdominal:     Palpations: Abdomen is soft.  Musculoskeletal:     Cervical back: Neck supple.     Right lower leg: No edema.     Left lower leg: No edema.  Lymphadenopathy:     Cervical: No cervical adenopathy.  Skin:    General: Skin is warm and dry.  Neurological:     General: No focal deficit present.     Mental Status: He is alert and oriented to person, place, and time.      Comments: Monofilament foot exam is normal.  Psychiatric:        Mood and Affect: Mood normal.        Behavior: Behavior normal.        Thought Content: Thought content normal.        Judgment: Judgment normal.       Results for orders placed or performed in visit on 12/26/19  POCT glycosylated hemoglobin (Hb A1C)  Result Value Ref Range   Hemoglobin A1C 6.7 (A) 4.0 - 5.6 %   HbA1c POC (<> result, manual entry)     HbA1c, POC (prediabetic range)     HbA1c, POC (controlled diabetic range)     Est. average glucose Bld gHb Est-mCnc 146     Assessment & Plan     1. Type 2 diabetes mellitus without complication, without long-term current use of insulin (HCC) To avoid hypoglycemia decrease glipizide from 10 mg to 5 mg.  Also doing well on pioglitazone. - POCT glycosylated hemoglobin (Hb A1C)--well-controlled today with hemoglobin A1c of 6.7.  2. CAD in native artery All risk factors treated.  3. Essential (primary) hypertension On losartan and metoprolol  4. Pure hypercholesterolemia On atorvastatin 5.  Allergic rhinitis Consider stopping montelukast in the future.  No follow-ups on file.         Ikeem Cleckler Cranford Mon, MD  [facility] [phone] (phone) [phone] (fax)  [place]

## 2019-12-26 ENCOUNTER — Encounter: Payer: Self-pay | Admitting: Family Medicine

## 2019-12-26 ENCOUNTER — Other Ambulatory Visit: Payer: Self-pay

## 2019-12-26 ENCOUNTER — Ambulatory Visit (INDEPENDENT_AMBULATORY_CARE_PROVIDER_SITE_OTHER): Payer: Medicare Other | Admitting: Family Medicine

## 2019-12-26 VITALS — BP 131/83 | HR 72 | Resp 16 | Ht 67.0 in | Wt 167.0 lb

## 2019-12-26 DIAGNOSIS — I2510 Atherosclerotic heart disease of native coronary artery without angina pectoris: Secondary | ICD-10-CM | POA: Diagnosis not present

## 2019-12-26 DIAGNOSIS — E7800 Pure hypercholesterolemia, unspecified: Secondary | ICD-10-CM

## 2019-12-26 DIAGNOSIS — I10 Essential (primary) hypertension: Secondary | ICD-10-CM | POA: Diagnosis not present

## 2019-12-26 DIAGNOSIS — E119 Type 2 diabetes mellitus without complications: Secondary | ICD-10-CM | POA: Diagnosis not present

## 2019-12-26 DIAGNOSIS — J302 Other seasonal allergic rhinitis: Secondary | ICD-10-CM | POA: Diagnosis not present

## 2019-12-26 LAB — POCT GLYCOSYLATED HEMOGLOBIN (HGB A1C)
Est. average glucose Bld gHb Est-mCnc: 146
Hemoglobin A1C: 6.7 % — AB (ref 4.0–5.6)

## 2019-12-26 NOTE — Patient Instructions (Signed)
Decrease Glipizide 10mg  to 5mg  daily.

## 2019-12-27 ENCOUNTER — Telehealth: Payer: Self-pay

## 2019-12-27 NOTE — Telephone Encounter (Signed)
°  Chronic Care Management   Outreach Note  12/27/2019 Name: Carl Dominguez MRN: [id number] DOB: April 14, 1945  Primary Care Provider: Jerrol Banana., MD Reason for referral : Chronic Care Management    An unsuccessful telephone outreach was attempted today. [name] is currently engaged with the chronic care management team.    Follow Up Plan:  A member of the care management team will follow-up with [name] within the next two weeks.    [facility] [phone]

## 2019-12-31 ENCOUNTER — Other Ambulatory Visit: Payer: Self-pay

## 2019-12-31 MED ORDER — GLIPIZIDE 5 MG PO TABS: 5.0000 mg | ORAL_TABLET | Freq: Every day | ORAL | 1 refills | Status: DC

## 2019-12-31 NOTE — Telephone Encounter (Signed)
Patient was told by Dr. Rosanna Randy to decrease his Glipizide to 5mg . so he needs a prescription sent to [facility] for this.

## 2019-12-31 NOTE — Telephone Encounter (Signed)
Rx sent 

## 2020-01-01 DIAGNOSIS — M955 Acquired deformity of pelvis: Secondary | ICD-10-CM | POA: Diagnosis not present

## 2020-01-01 DIAGNOSIS — M546 Pain in thoracic spine: Secondary | ICD-10-CM | POA: Diagnosis not present

## 2020-01-01 DIAGNOSIS — M9903 Segmental and somatic dysfunction of lumbar region: Secondary | ICD-10-CM | POA: Diagnosis not present

## 2020-01-01 DIAGNOSIS — M9902 Segmental and somatic dysfunction of thoracic region: Secondary | ICD-10-CM | POA: Diagnosis not present

## 2020-01-01 DIAGNOSIS — M5441 Lumbago with sciatica, right side: Secondary | ICD-10-CM | POA: Diagnosis not present

## 2020-01-01 DIAGNOSIS — M9905 Segmental and somatic dysfunction of pelvic region: Secondary | ICD-10-CM | POA: Diagnosis not present

## 2020-01-17 ENCOUNTER — Ambulatory Visit (INDEPENDENT_AMBULATORY_CARE_PROVIDER_SITE_OTHER): Payer: Medicare Other

## 2020-01-17 ENCOUNTER — Other Ambulatory Visit: Payer: Self-pay

## 2020-01-17 DIAGNOSIS — Z23 Encounter for immunization: Secondary | ICD-10-CM | POA: Diagnosis not present

## 2020-01-30 DIAGNOSIS — M9903 Segmental and somatic dysfunction of lumbar region: Secondary | ICD-10-CM | POA: Diagnosis not present

## 2020-01-30 DIAGNOSIS — M955 Acquired deformity of pelvis: Secondary | ICD-10-CM | POA: Diagnosis not present

## 2020-01-30 DIAGNOSIS — M9902 Segmental and somatic dysfunction of thoracic region: Secondary | ICD-10-CM | POA: Diagnosis not present

## 2020-01-30 DIAGNOSIS — M5441 Lumbago with sciatica, right side: Secondary | ICD-10-CM | POA: Diagnosis not present

## 2020-01-30 DIAGNOSIS — M546 Pain in thoracic spine: Secondary | ICD-10-CM | POA: Diagnosis not present

## 2020-01-30 DIAGNOSIS — M9905 Segmental and somatic dysfunction of pelvic region: Secondary | ICD-10-CM | POA: Diagnosis not present

## 2020-02-28 ENCOUNTER — Telehealth: Payer: Self-pay

## 2020-02-28 NOTE — Telephone Encounter (Signed)
  Chronic Care Management   Outreach Note  02/28/2020 Name: Carl Dominguez MRN: [id number] DOB: Dec 20, 1945  Primary Care Provider: Maple Hudson., MD Reason for referral : Chronic Care Provider   An unsuccessful telephone outreach was attempted today. Carl Dominguez is currently enrolled in the chronic care management program.    Follow Up Plan:  A member of the care management team will attempt to reach Carl Dominguez within the next two weeks.    [facility] Care Management [facility] [phone]

## 2020-03-04 DIAGNOSIS — M546 Pain in thoracic spine: Secondary | ICD-10-CM | POA: Diagnosis not present

## 2020-03-04 DIAGNOSIS — M9902 Segmental and somatic dysfunction of thoracic region: Secondary | ICD-10-CM | POA: Diagnosis not present

## 2020-03-04 DIAGNOSIS — M9905 Segmental and somatic dysfunction of pelvic region: Secondary | ICD-10-CM | POA: Diagnosis not present

## 2020-03-04 DIAGNOSIS — M9903 Segmental and somatic dysfunction of lumbar region: Secondary | ICD-10-CM | POA: Diagnosis not present

## 2020-03-04 DIAGNOSIS — M5441 Lumbago with sciatica, right side: Secondary | ICD-10-CM | POA: Diagnosis not present

## 2020-03-04 DIAGNOSIS — M955 Acquired deformity of pelvis: Secondary | ICD-10-CM | POA: Diagnosis not present

## 2020-03-06 ENCOUNTER — Other Ambulatory Visit: Payer: Self-pay | Admitting: Family Medicine

## 2020-03-17 ENCOUNTER — Other Ambulatory Visit: Payer: Self-pay | Admitting: Family Medicine

## 2020-03-17 DIAGNOSIS — I10 Essential (primary) hypertension: Secondary | ICD-10-CM

## 2020-03-19 DIAGNOSIS — I2510 Atherosclerotic heart disease of native coronary artery without angina pectoris: Secondary | ICD-10-CM | POA: Insufficient documentation

## 2020-03-19 DIAGNOSIS — K625 Hemorrhage of anus and rectum: Secondary | ICD-10-CM | POA: Diagnosis not present

## 2020-03-19 DIAGNOSIS — R151 Fecal smearing: Secondary | ICD-10-CM | POA: Diagnosis not present

## 2020-03-19 DIAGNOSIS — R197 Diarrhea, unspecified: Secondary | ICD-10-CM | POA: Diagnosis not present

## 2020-03-19 DIAGNOSIS — M1990 Unspecified osteoarthritis, unspecified site: Secondary | ICD-10-CM | POA: Insufficient documentation

## 2020-03-19 DIAGNOSIS — Z9861 Coronary angioplasty status: Secondary | ICD-10-CM | POA: Insufficient documentation

## 2020-03-19 DIAGNOSIS — N400 Enlarged prostate without lower urinary tract symptoms: Secondary | ICD-10-CM | POA: Insufficient documentation

## 2020-03-19 DIAGNOSIS — K5790 Diverticulosis of intestine, part unspecified, without perforation or abscess without bleeding: Secondary | ICD-10-CM | POA: Insufficient documentation

## 2020-03-19 DIAGNOSIS — Z86010 Personal history of colonic polyps: Secondary | ICD-10-CM | POA: Insufficient documentation

## 2020-04-02 DIAGNOSIS — M546 Pain in thoracic spine: Secondary | ICD-10-CM | POA: Diagnosis not present

## 2020-04-02 DIAGNOSIS — M9902 Segmental and somatic dysfunction of thoracic region: Secondary | ICD-10-CM | POA: Diagnosis not present

## 2020-04-02 DIAGNOSIS — M9903 Segmental and somatic dysfunction of lumbar region: Secondary | ICD-10-CM | POA: Diagnosis not present

## 2020-04-02 DIAGNOSIS — M955 Acquired deformity of pelvis: Secondary | ICD-10-CM | POA: Diagnosis not present

## 2020-04-02 DIAGNOSIS — M9905 Segmental and somatic dysfunction of pelvic region: Secondary | ICD-10-CM | POA: Diagnosis not present

## 2020-04-02 DIAGNOSIS — M5441 Lumbago with sciatica, right side: Secondary | ICD-10-CM | POA: Diagnosis not present

## 2020-04-02 NOTE — Progress Notes (Signed)
Subjective:   Carl Dominguez is a 75 y.o. male who presents for Medicare Annual/Subsequent preventive examination.  I connected with Carl Dominguez today by telephone and verified that I am speaking with the correct person using two identifiers. Location patient: home Location provider: work Persons participating in the virtual visit: patient, provider.   I discussed the limitations, risks, security and privacy concerns of performing an evaluation and management service by telephone and the availability of in person appointments. I also discussed with the patient that there may be a patient responsible charge related to this service. The patient expressed understanding and verbally consented to this telephonic visit.    Interactive audio and video telecommunications were attempted between this provider and patient, however failed, due to patient having technical difficulties OR patient did not have access to video capability.  We continued and completed visit with audio only.   Review of Systems    N/A  Cardiac Risk Factors include: advanced age (>21men, >54 women);diabetes mellitus;dyslipidemia;male gender;hypertension     Objective:    There were no vitals filed for this visit. There is no height or weight on file to calculate BMI.  Advanced Directives 04/03/2020 04/02/2019 03/22/2018 03/04/2017 03/24/2016  Does Patient Have a Medical Advance Directive? Yes No No Yes Yes  Type of Paramedic of [place];Living will - - Living will Living will  Does patient want to make changes to medical advance directive? - - - - No - Patient declined  Copy of [place] in Chart? No - copy requested - - - -  Would patient like information on creating a medical advance directive? - No - Patient declined No - Patient declined - -    Current Medications (verified) Outpatient Encounter Medications as of 04/03/2020  Medication Sig  . acetaminophen (TYLENOL) 500 MG  tablet Take 500 mg by mouth 2 (two) times daily.  Marland Kitchen aspirin 81 MG tablet Take 81 mg by mouth daily.   Marland Kitchen atorvastatin (LIPITOR) 40 MG tablet TAKE 1 TABLET AT BEDTIME  . cetirizine (ZYRTEC) 10 MG tablet TAKE 1 TABLET BY MOUTH EVERY DAY  . cholecalciferol (VITAMIN D) 1000 UNITS tablet Take 1,000 Units by mouth daily.   . cholestyramine (QUESTRAN) 4 GM/DOSE powder Take one 4 gram scoop daily.  No other medication 4 hours before or after.  . diazepam (VALIUM) 2 MG tablet Take 1 tablet (2 mg total) by mouth every 8 (eight) hours as needed for anxiety.  . famotidine (PEPCID) 20 MG tablet Take 20 mg by mouth daily.   . fluticasone (FLONASE) 50 MCG/ACT nasal spray Place 2 sprays into both nostrils daily. (Patient taking differently: Place 2 sprays into both nostrils daily. As needed)  . glipiZIDE (GLUCOTROL) 5 MG tablet Take 1 tablet (5 mg total) by mouth daily before breakfast.  . glucose blood (FREESTYLE TEST STRIPS) test strip TEST BLOOD SUGAR EVERY DAY AND AS NEEDED  . Lactobacillus-Inulin (PROBIOTIC DIGESTIVE SUPPORT PO) Take 1 capsule by mouth daily.  . Lancets (FREESTYLE) lancets USE AS DIRECTED  . latanoprost (XALATAN) 0.005 % ophthalmic solution Place 1 drop into both eyes at bedtime.   Marland Kitchen losartan (COZAAR) 50 MG tablet Take 1 tablet (50 mg total) by mouth daily.  . metFORMIN (GLUCOPHAGE-XR) 500 MG 24 hr tablet TAKE 1 TABLET(500 MG) BY MOUTH DAILY WITH BREAKFAST  . metoprolol succinate (TOPROL-XL) 25 MG 24 hr tablet TAKE 1 TABLET DAILY  . montelukast (SINGULAIR) 10 MG tablet TAKE 1 TABLET(10 MG) BY  MOUTH AT BEDTIME  . naproxen (NAPROSYN) 500 MG tablet Take 1 tablet (500 mg total) by mouth 2 (two) times daily as needed.  . pioglitazone (ACTOS) 45 MG tablet TAKE 1 TABLET DAILY  . psyllium (METAMUCIL SMOOTH TEXTURE) 28 % packet Take 1 packet by mouth 2 (two) times daily.   Marland Kitchen triamcinolone cream (KENALOG) 0.1 % APPLY TO AFFECTED AREA QD TO BID  . vitamin C (ASCORBIC ACID) 500 MG tablet Take 500 mg  by mouth daily.   . cyclobenzaprine (FLEXERIL) 10 MG tablet Take 1 tablet (10 mg total) by mouth daily as needed for muscle spasms. (Patient not taking: No sig reported)  . ibuprofen (ADVIL,MOTRIN) 200 MG tablet Take 400 mg by mouth every morning.  (Patient not taking: Reported on 04/03/2020)  . losartan (COZAAR) 50 MG tablet TAKE 1 TABLET BY MOUTH EVERY DAY (Patient not taking: Reported on 04/03/2020)  . losartan (COZAAR) 50 MG tablet TAKE 1 TABLET BY MOUTH EVERY DAY (Patient not taking: Reported on 04/03/2020)   No facility-administered encounter medications on file as of 04/03/2020.    Allergies (verified) Patient has no known allergies.   History: Past Medical History:  Diagnosis Date  . Diabetes mellitus without complication ([place])   . Heart disease   . Hyperlipidemia   . Hypertension   . Myocardial infarction ([place]) 1999   Past Surgical History:  Procedure Laterality Date  . CATARACT EXTRACTION W/PHACO Right 03/24/2016   Procedure: CATARACT EXTRACTION PHACO AND INTRAOCULAR LENS PLACEMENT ([place])  Right;  Surgeon: Leandrew Koyanagi, MD;  Location: [facility];  Service: Ophthalmology;  Laterality: Right;  Diabetic - oral meds right eye  . CHOLECYSTECTOMY  2010  . CORONARY ANGIOPLASTY WITH STENT PLACEMENT  1999  . HERNIA REPAIR     History reviewed. No pertinent family history. Social History   Socioeconomic History  . Marital status: Married    Spouse name: Not on file  . Number of children: 2  . Years of education: Not on file  . Highest education level: Some college, no degree  Occupational History  . Occupation: retired  Tobacco Use  . Smoking status: Former Smoker    Types: Cigarettes    Quit date: 03/01/1982    Years since quitting: 38.1  . Smokeless tobacco: Never Used  . Tobacco comment: has been quit for 30 years  Vaping Use  . Vaping Use: Never used  Substance and Sexual Activity  . Alcohol use: Yes    Comment: occasionally- 1 beer or margarita  . Drug  use: No  . Sexual activity: Not on file  Other Topics Concern  . Not on file  Social History Narrative  . Not on file   Social Determinants of Health   Financial Resource Strain: Low Risk   . Difficulty of Paying Living Expenses: Not hard at all  Food Insecurity: No Food Insecurity  . Worried About Charity fundraiser in the Last Year: Never true  . Ran Out of Food in the Last Year: Never true  Transportation Needs: No Transportation Needs  . Lack of Transportation (Medical): No  . Lack of Transportation (Non-Medical): No  Physical Activity: Inactive  . Days of Exercise per Week: 0 days  . Minutes of Exercise per Session: 0 min  Stress: No Stress Concern Present  . Feeling of Stress : Not at all  Social Connections: Socially Integrated  . Frequency of Communication with Friends and Family: More than three times a week  . Frequency of Social  Gatherings with Friends and Family: More than three times a week  . Attends Religious Services: More than 4 times per year  . Active Member of Clubs or Organizations: Yes  . Attends Banker Meetings: More than 4 times per year  . Marital Status: Married    Tobacco Counseling Counseling given: Not Answered Comment: has been quit for 30 years   Clinical Intake:  Pre-visit preparation completed: Yes  Pain : No/denies pain     Nutritional Risks: None Diabetes: Yes  How often do you need to have someone help you when you read instructions, pamphlets, or other written materials from your doctor or pharmacy?: 1 - Never  Diabetic? Yes  Nutrition Risk Assessment:  Has the patient had any N/V/D within the last 2 months?  No  Does the patient have any non-healing wounds?  No  Has the patient had any unintentional weight loss or weight gain?  No   Diabetes:  Is the patient diabetic?  Yes  If diabetic, was a CBG obtained today?  No  Did the patient bring in their glucometer from home?  No  How often do you monitor your  CBG's? Once a day in the morning.   Financial Strains and Diabetes Management:  Are you having any financial strains with the device, your supplies or your medication? No .  Does the patient want to be seen by Chronic Care Management for management of their diabetes?  No  Would the patient like to be referred to a Nutritionist or for Diabetic Management?  No   Diabetic Exams:  Diabetic Eye Exam: Completed 11/20/19 Diabetic Foot Exam: Completed 08/23/19   Interpreter Needed?: No  Information entered by :: [facility], LPN   Activities of Daily Living In your present state of health, do you have any difficulty performing the following activities: 04/03/2020 07/03/2019  Hearing? N N  Vision? N N  Difficulty concentrating or making decisions? N N  Walking or climbing stairs? Y N  Comment Due to leg pains. -  Dressing or bathing? N N  Doing errands, shopping? N N  Preparing Food and eating ? N N  Using the Toilet? N N  In the past six months, have you accidently leaked urine? Y N  Comment Occasionally with urges. -  Do you have problems with loss of bowel control? N N  Managing your Medications? N N  Managing your Finances? N N  Housekeeping or managing your Housekeeping? N N  Some recent data might be hidden    Patient Care Team: Maple Hudson., MD as PCP - General (Family Medicine) Lockie Mola, MD as Referring Physician (Ophthalmology) Sharrell Ku, MD as Consulting Physician (Gastroenterology) Alwyn Pea, MD as Consulting Physician (Cardiology) Juanell Fairly, RN as Case Manager  Indicate any recent Medical Services you may have received from other than [facility] providers in the past year (date may be approximate).     Assessment:   This is a routine wellness examination for Carl Dominguez.  Hearing/Vision screen No exam data present  Dietary issues and exercise activities discussed: Current Exercise Habits: The patient does not participate in regular  exercise at present, Exercise limited by: None identified  Goals    . Chronic Disease Management     CARE PLAN ENTRY (see longitudinal plan of care for additional care plan information)  Current Barriers: . Chronic Disease Management support and education needs related to Hypertension, Hyperlipidemia, Diabetes and GERD.  Case Manager Clinical Goal(s):  Marland Kitchen Over  the next 90 days, patient will attend all scheduled medical appointments. -Complete . Over the next 90 days, patient will take all medications as prescribed.-Complete . Over the next 90 days, patient will monitor blood glucose daily and maintain a log.-Complete . Over the next 90 days, patient will continue to engaged in mild exercises and activities as tolerated.-Complete . Over the next 90 days, patient will follow safety recommendations to prevent falls.-Complete . Over the next 60 days, patient will document daily nutritional intake. . Over the next 30 days, patient will notify team if he requires assistance with obtaining needed medical supplies.-Complete  Interventions:  . Inter-disciplinary care team collaboration (see longitudinal plan of care) . Discussed compliance with treatments and plan for ongoing care management. Remains compliant with medications, blood glucose monitoring and provider treatment recommendations. No concerns regarding prescription costs. Reports an elevated FBS reading of 140 mg/dl today. Reports most readings have ranged from 102 to 116 mg/dl. A1C was down from 8.3% to 7% in June. He's doing very well with maintaining a diabetic/heart healthy diet. He was previously evaluated for back discomfort and strain. Reports significant improvement since starting chiropractic therapy. Reports good activity tolerance and continues to perform tasks independently. He remains very motivated to reach his health goals. Denies urgent concerns or changes in care management needs. He will continue monthly chiropractic therapy.  Scheduled for follow-up PCP outreach on 12/26/19.   Patient Self Care Activities:  . Self administers medications  . Attends scheduled provider appointments . Calls pharmacy for medication refills . Performs ADL's independently . Performs IADL's independently . Calls provider office for new concerns or questions   Please see past updates related to this goal by clicking on the "Past Updates" button in the selected goal      . DIET - INCREASE WATER INTAKE     Recommend increasing water intake to 4 glasses a day.     . Exercise 3x per week (30 min per time)     Recommend to exercise for 3 days a week for at least 30 minutes at a time.        Depression Screen PHQ 2/9 Scores 04/03/2020 07/03/2019 04/02/2019 04/02/2019 03/22/2018 03/04/2017 11/15/2016  PHQ - 2 Score 0 0 0 0 0 0 0    Fall Risk Fall Risk  04/03/2020 07/03/2019 04/02/2019 03/22/2018 03/04/2017  Falls in the past year? 0 1 1 0 No  Number falls in past yr: 0 0 0 - -  Injury with Fall? 0 0 0 - -  Follow up - Falls prevention discussed Falls prevention discussed - -    FALL RISK PREVENTION PERTAINING TO THE HOME:  Any stairs in or around the home? No  If so, are there any without handrails? No  Home free of loose throw rugs in walkways, pet beds, electrical cords, etc? Yes  Adequate lighting in your home to reduce risk of falls? Yes   ASSISTIVE DEVICES UTILIZED TO PREVENT FALLS:  Life alert? No  Use of a cane, walker or w/c? No  Grab bars in the bathroom? No  Shower chair or bench in shower? No  Elevated toilet seat or a handicapped toilet? No    Cognitive Function: Normal cognitive status assessed by observation by this Nurse Health Advisor. No abnormalities found.       6CIT Screen 03/22/2018 03/04/2017  What Year? 0 points 0 points  What month? 0 points 0 points  What time? 0 points 0 points  Count back from 20  0 points 0 points  Months in reverse 0 points 0 points  Repeat phrase 0 points 2 points  Total Score 0 2     Immunizations Immunization History  Administered Date(s) Administered  . Influenza, High Dose Seasonal PF 12/01/2015, 12/03/2016, 12/06/2017, 12/12/2019  . Influenza-Unspecified 11/30/2014, 12/12/2018  . PFIZER(Purple Top)SARS-COV-2 Vaccination 03/07/2019, 03/28/2019, 01/17/2020  . Pneumococcal Conjugate-13 12/07/2012, 03/14/2014  . Pneumococcal Polysaccharide-23 02/03/2005, 12/13/2008  . Tdap 12/06/2007, 08/30/2018  . Zoster 12/31/2010  . Zoster Recombinat (Shingrix) 12/03/2016, 04/14/2017    TDAP status: Up to date  Flu Vaccine status: Up to date  Pneumococcal vaccine status: Up to date  Covid-19 vaccine status: Completed vaccines  Qualifies for Shingles Vaccine? Yes   Zostavax completed Yes   Shingrix Completed?: Yes  Screening Tests Health Maintenance  Topic Date Due  . HEMOGLOBIN A1C  06/25/2020  . COVID-19 Vaccine (4 - Booster for Pfizer series) 07/16/2020  . FOOT EXAM  08/22/2020  . OPHTHALMOLOGY EXAM  11/19/2020  . COLONOSCOPY (Pts 45-51yrs Insurance coverage will need to be confirmed)  04/04/2022  . TETANUS/TDAP  08/29/2028  . INFLUENZA VACCINE  Completed  . Hepatitis C Screening  Completed  . PNA vac Low Risk Adult  Completed    Health Maintenance  There are no preventive care reminders to display for this patient.  Colorectal cancer screening: Type of screening: Colonoscopy. Completed 04/15/17. Repeat every 5 years  Lung Cancer Screening: (Low Dose CT Chest recommended if Age 3-80 years, 30 pack-year currently smoking OR have quit w/in 15years.) does not qualify.   Additional Screening:  Hepatitis C Screening: Up to date  Vision Screening: Recommended annual ophthalmology exams for early detection of glaucoma and other disorders of the eye. Is the patient up to date with their annual eye exam?  Yes  Who is the provider or what is the name of the office in which the patient attends annual eye exams? Dr Wallace Going @ [facility] If pt is not established  with a provider, would they like to be referred to a provider to establish care? No .   Dental Screening: Recommended annual dental exams for proper oral hygiene  Community Resource Referral / Chronic Care Management: CRR required this visit?  No   CCM required this visit?  No      Plan:     I have personally reviewed and noted the following in the patient's chart:   . Medical and social history . Use of alcohol, tobacco or illicit drugs  . Current medications and supplements . Functional ability and status . Nutritional status . Physical activity . Advanced directives . List of other physicians . Hospitalizations, surgeries, and ER visits in previous 12 months . Vitals . Screenings to include cognitive, depression, and falls . Referrals and appointments  In addition, I have reviewed and discussed with patient certain preventive protocols, quality metrics, and best practice recommendations. A written personalized care plan for preventive services as well as general preventive health recommendations were provided to patient.     Denisa Enterline [place], [place]   [id number]   Nurse Notes: None.

## 2020-04-03 ENCOUNTER — Other Ambulatory Visit: Payer: Self-pay

## 2020-04-03 ENCOUNTER — Ambulatory Visit (INDEPENDENT_AMBULATORY_CARE_PROVIDER_SITE_OTHER): Payer: Medicare Other

## 2020-04-03 DIAGNOSIS — Z0000 Encounter for general adult medical examination without abnormal findings: Secondary | ICD-10-CM | POA: Diagnosis not present

## 2020-04-03 NOTE — Patient Instructions (Signed)
Carl Dominguez , Thank you for taking time to come for your Medicare Wellness Visit. I appreciate your ongoing commitment to your health goals. Please review the following plan we discussed and let me know if I can assist you in the future.   Screening recommendations/referrals: Colonoscopy: Up to date, due 04/2022 Recommended yearly ophthalmology/optometry visit for glaucoma screening and checkup Recommended yearly dental visit for hygiene and checkup  Vaccinations: Influenza vaccine: Done 12/12/19 Pneumococcal vaccine: Completed series Tdap vaccine: Up to date, due 08/2028 Shingles vaccine: Completed series    Advanced directives: Please bring a copy of your POA (Power of [place]) and/or Living Will to your next appointment.   Conditions/risks identified: Recommend to start back exercising 3 days a week for at least 30 minutes at a time. Also recommend to increase water intake to 6-8 8 oz glasses a day.   Next appointment: 04/24/20 @ 8:00 AM with Dr Rosanna Randy   Preventive Care 77 Years and Older, Male Preventive care refers to lifestyle choices and visits with your health care provider that can promote health and wellness. What does preventive care include?  A yearly physical exam. This is also called an annual well check.  Dental exams once or twice a year.  Routine eye exams. Ask your health care provider how often you should have your eyes checked.  Personal lifestyle choices, including:  Daily care of your teeth and gums.  Regular physical activity.  Eating a healthy diet.  Avoiding tobacco and drug use.  Limiting alcohol use.  Practicing safe sex.  Taking low doses of aspirin every day.  Taking vitamin and mineral supplements as recommended by your health care provider. What happens during an annual well check? The services and screenings done by your health care provider during your annual well check will depend on your age, overall health, lifestyle risk factors, and  family history of disease. Counseling  Your health care provider may ask you questions about your:  Alcohol use.  Tobacco use.  Drug use.  Emotional well-being.  Home and relationship well-being.  Sexual activity.  Eating habits.  History of falls.  Memory and ability to understand (cognition).  Work and work Statistician. Screening  You may have the following tests or measurements:  Height, weight, and BMI.  Blood pressure.  Lipid and cholesterol levels. These may be checked every 5 years, or more frequently if you are over 77 years old.  Skin check.  Lung cancer screening. You may have this screening every year starting at age 77 if you have a 30-pack-year history of smoking and currently smoke or have quit within the past 15 years.  Fecal occult blood test (FOBT) of the stool. You may have this test every year starting at age 77.  Flexible sigmoidoscopy or colonoscopy. You may have a sigmoidoscopy every 5 years or a colonoscopy every 10 years starting at age 77.  Prostate cancer screening. Recommendations will vary depending on your family history and other risks.  Hepatitis C blood test.  Hepatitis B blood test.  Sexually transmitted disease (STD) testing.  Diabetes screening. This is done by checking your blood sugar (glucose) after you have not eaten for a while (fasting). You may have this done every 1-3 years.  Abdominal aortic aneurysm (AAA) screening. You may need this if you are a current or former smoker.  Osteoporosis. You may be screened starting at age 77 if you are at high risk. Talk with your health care provider about your test results, treatment  options, and if necessary, the need for more tests. Vaccines  Your health care provider may recommend certain vaccines, such as:  Influenza vaccine. This is recommended every year.  Tetanus, diphtheria, and acellular pertussis (Tdap, Td) vaccine. You may need a Td booster every 10 years.  Zoster  vaccine. You may need this after age 70.  Pneumococcal 13-valent conjugate (PCV13) vaccine. One dose is recommended after age 53.  Pneumococcal polysaccharide (PPSV23) vaccine. One dose is recommended after age 34. Talk to your health care provider about which screenings and vaccines you need and how often you need them. This information is not intended to replace advice given to you by your health care provider. Make sure you discuss any questions you have with your health care provider. Document Released: 03/14/2015 Document Revised: 11/05/2015 Document Reviewed: 12/17/2014 Elsevier Interactive Patient Education  2017 [employer] Prevention in the Home Falls can cause injuries. They can happen to people of all ages. There are many things you can do to make your home safe and to help prevent falls. What can I do on the outside of my home?  Regularly fix the edges of walkways and driveways and fix any cracks.  Remove anything that might make you trip as you walk through a door, such as a raised step or threshold.  Trim any bushes or trees on the path to your home.  Use bright outdoor lighting.  Clear any walking paths of anything that might make someone trip, such as rocks or tools.  Regularly check to see if handrails are loose or broken. Make sure that both sides of any steps have handrails.  Any raised decks and porches should have guardrails on the edges.  Have any leaves, snow, or ice cleared regularly.  Use sand or salt on walking paths during winter.  Clean up any spills in your garage right away. This includes oil or grease spills. What can I do in the bathroom?  Use night lights.  Install grab bars by the toilet and in the tub and shower. Do not use towel bars as grab bars.  Use non-skid mats or decals in the tub or shower.  If you need to sit down in the shower, use a plastic, non-slip stool.  Keep the floor dry. Clean up any water that spills on the  floor as soon as it happens.  Remove soap buildup in the tub or shower regularly.  Attach bath mats securely with double-sided non-slip rug tape.  Do not have throw rugs and other things on the floor that can make you trip. What can I do in the bedroom?  Use night lights.  Make sure that you have a light by your bed that is easy to reach.  Do not use any sheets or blankets that are too big for your bed. They should not hang down onto the floor.  Have a firm chair that has side arms. You can use this for support while you get dressed.  Do not have throw rugs and other things on the floor that can make you trip. What can I do in the kitchen?  Clean up any spills right away.  Avoid walking on wet floors.  Keep items that you use a lot in easy-to-reach places.  If you need to reach something above you, use a strong step stool that has a grab bar.  Keep electrical cords out of the way.  Do not use floor polish or wax that makes floors slippery.  If you must use wax, use non-skid floor wax.  Do not have throw rugs and other things on the floor that can make you trip. What can I do with my stairs?  Do not leave any items on the stairs.  Make sure that there are handrails on both sides of the stairs and use them. Fix handrails that are broken or loose. Make sure that handrails are as long as the stairways.  Check any carpeting to make sure that it is firmly attached to the stairs. Fix any carpet that is loose or worn.  Avoid having throw rugs at the top or bottom of the stairs. If you do have throw rugs, attach them to the floor with carpet tape.  Make sure that you have a light switch at the top of the stairs and the bottom of the stairs. If you do not have them, ask someone to add them for you. What else can I do to help prevent falls?  Wear shoes that:  Do not have high heels.  Have rubber bottoms.  Are comfortable and fit you well.  Are closed at the toe. Do not wear  sandals.  If you use a stepladder:  Make sure that it is fully opened. Do not climb a closed stepladder.  Make sure that both sides of the stepladder are locked into place.  Ask someone to hold it for you, if possible.  Clearly mark and make sure that you can see:  Any grab bars or handrails.  First and last steps.  Where the edge of each step is.  Use tools that help you move around (mobility aids) if they are needed. These include:  Canes.  Walkers.  Scooters.  Crutches.  Turn on the lights when you go into a dark area. Replace any light bulbs as soon as they burn out.  Set up your furniture so you have a clear path. Avoid moving your furniture around.  If any of your floors are uneven, fix them.  If there are any pets around you, be aware of where they are.  Review your medicines with your doctor. Some medicines can make you feel dizzy. This can increase your chance of falling. Ask your doctor what other things that you can do to help prevent falls. This information is not intended to replace advice given to you by your health care provider. Make sure you discuss any questions you have with your health care provider. Document Released: 12/12/2008 Document Revised: 07/24/2015 Document Reviewed: 03/22/2014 Elsevier Interactive Patient Education  2017 [employer].

## 2020-04-14 DIAGNOSIS — R001 Bradycardia, unspecified: Secondary | ICD-10-CM | POA: Diagnosis not present

## 2020-04-14 DIAGNOSIS — K219 Gastro-esophageal reflux disease without esophagitis: Secondary | ICD-10-CM | POA: Diagnosis not present

## 2020-04-14 DIAGNOSIS — I25118 Atherosclerotic heart disease of native coronary artery with other forms of angina pectoris: Secondary | ICD-10-CM | POA: Diagnosis not present

## 2020-04-14 DIAGNOSIS — E119 Type 2 diabetes mellitus without complications: Secondary | ICD-10-CM | POA: Diagnosis not present

## 2020-04-14 DIAGNOSIS — E7800 Pure hypercholesterolemia, unspecified: Secondary | ICD-10-CM | POA: Diagnosis not present

## 2020-04-14 DIAGNOSIS — I10 Essential (primary) hypertension: Secondary | ICD-10-CM | POA: Diagnosis not present

## 2020-04-14 DIAGNOSIS — Z955 Presence of coronary angioplasty implant and graft: Secondary | ICD-10-CM | POA: Diagnosis not present

## 2020-04-23 NOTE — Progress Notes (Signed)
I,[name],acting as a scribe for Carl Durie, MD.,have documented all relevant documentation on the behalf of Carl Durie, MD,as directed by  Carl Durie, MD while in the presence of Carl Durie, MD.   Established patient visit   Patient: Carl Dominguez   DOB: [birth year]   75 y.o. Male  MRN: [id number] Visit Date: 04/24/2020  Today's healthcare provider: [name], MD   Chief Complaint  Patient presents with   Follow-up   Hypertension   Diabetes   Hyperlipidemia   Patient had AWE on 04/03/2020.   Subjective    HPI  Patient comes in today for follow-up.  He feels fairly well.  He has no complaints today.  He needs routine lab work for follow-up on his diabetes lipids hypertension etc. Diabetes Mellitus Type II, follow-up  Lab Results  Component Value Date   HGBA1C 6.7 (A) 12/26/2019   HGBA1C 7.0 (A) 08/23/2019   HGBA1C 8.3 (H) 04/25/2019   Last seen for diabetes 4 months ago.  Management since then includes; decreased glipizide from 10mg  to 5mg  daily. He reports good compliance with treatment. He is not having side effects. none  Home blood sugar records: fasting range: 139-161  Episodes of hypoglycemia? No none   Current insulin regiment: n/a Most Recent Eye Exam: 11/20/2019  --------------------------------------------------------------------  Hypertension, follow-up  BP Readings from Last 3 Encounters:  04/24/20 104/65  12/26/19 131/83  09/26/19 (!) 127/64   Wt Readings from Last 3 Encounters:  04/24/20 166 lb (75.3 kg)  12/26/19 167 lb (75.8 kg)  09/26/19 167 lb 12.8 oz (76.1 kg)     He was last seen for hypertension 4 months ago.  BP at that visit was 131/83. Management since that visit includes; on losartan and metoprolol He reports good compliance with treatment. He is not having side effects. none He is not exercising. He is adherent to low salt diet.   Outside blood pressures are not  checking.  He does not smoke.  Use of agents associated with hypertension: none.   --------------------------------------------------------------------  Lipid/Cholesterol, follow-up  Last Lipid Panel: Lab Results  Component Value Date   CHOL 126 04/25/2019   LDLCALC 60 04/25/2019   HDL 45 04/25/2019   TRIG 119 04/25/2019    He was last seen for this 1 years ago.  Management since that visit includes; on atorvastatin.  He reports good compliance with treatment. He is not having side effects. none He is following a Regular, Low Sodium diet. Current exercise: none  Last metabolic panel Lab Results  Component Value Date   GLUCOSE 186 (H) 04/25/2019   NA 141 04/25/2019   K 4.9 04/25/2019   BUN 18 04/25/2019   CREATININE 1.47 (H) 04/25/2019   GFRNONAA 46 (L) 04/25/2019   GFRAA 53 (L) 04/25/2019   CALCIUM 9.2 04/25/2019   AST 22 04/25/2019   ALT 16 04/25/2019   The ASCVD Risk score Carl Bussing DC Jr., et al., 2013) failed to calculate for the following reasons:   The valid total cholesterol range is 130 to 320 mg/dL  -------------------------------------------------------------------       Medications: Outpatient Medications Prior to Visit  Medication Sig   acetaminophen (TYLENOL) 500 MG tablet Take 500 mg by mouth 2 (two) times daily.   aspirin 81 MG tablet Take 81 mg by mouth daily.    atorvastatin (LIPITOR) 40 MG tablet TAKE 1 TABLET AT BEDTIME   cetirizine (ZYRTEC) 10 MG tablet TAKE 1 TABLET BY  MOUTH EVERY DAY   cholecalciferol (VITAMIN D) 1000 UNITS tablet Take 1,000 Units by mouth daily.    cholestyramine (QUESTRAN) 4 GM/DOSE powder Take one 4 gram scoop daily.  No other medication 4 hours before or after.   diazepam (VALIUM) 2 MG tablet Take 1 tablet (2 mg total) by mouth every 8 (eight) hours as needed for anxiety.   famotidine (PEPCID) 20 MG tablet Take 20 mg by mouth daily.    fluticasone (FLONASE) 50 MCG/ACT nasal spray Place 2 sprays into both  nostrils daily. (Patient taking differently: Place 2 sprays into both nostrils daily. As needed)   glipiZIDE (GLUCOTROL) 5 MG tablet Take 1 tablet (5 mg total) by mouth daily before breakfast.   glucose blood (FREESTYLE TEST STRIPS) test strip TEST BLOOD SUGAR EVERY DAY AND AS NEEDED   Lactobacillus-Inulin (PROBIOTIC DIGESTIVE SUPPORT PO) Take 1 capsule by mouth daily.   Lancets (FREESTYLE) lancets USE AS DIRECTED   latanoprost (XALATAN) 0.005 % ophthalmic solution Place 1 drop into both eyes at bedtime.    losartan (COZAAR) 50 MG tablet Take 1 tablet (50 mg total) by mouth daily.   metFORMIN (GLUCOPHAGE-XR) 500 MG 24 hr tablet TAKE 1 TABLET(500 MG) BY MOUTH DAILY WITH BREAKFAST   metoprolol succinate (TOPROL-XL) 25 MG 24 hr tablet TAKE 1 TABLET DAILY   montelukast (SINGULAIR) 10 MG tablet TAKE 1 TABLET(10 MG) BY MOUTH AT BEDTIME   naproxen (NAPROSYN) 500 MG tablet Take 1 tablet (500 mg total) by mouth 2 (two) times daily as needed.   pioglitazone (ACTOS) 45 MG tablet TAKE 1 TABLET DAILY   psyllium (METAMUCIL SMOOTH TEXTURE) 28 % packet Take 1 packet by mouth 2 (two) times daily.    triamcinolone cream (KENALOG) 0.1 % APPLY TO AFFECTED AREA QD TO BID   vitamin C (ASCORBIC ACID) 500 MG tablet Take 500 mg by mouth daily.    cyclobenzaprine (FLEXERIL) 10 MG tablet Take 1 tablet (10 mg total) by mouth daily as needed for muscle spasms. (Patient not taking: No sig reported)   ibuprofen (ADVIL,MOTRIN) 200 MG tablet Take 400 mg by mouth every morning.  (Patient not taking: No sig reported)   losartan (COZAAR) 50 MG tablet TAKE 1 TABLET BY MOUTH EVERY DAY (Patient not taking: No sig reported)   losartan (COZAAR) 50 MG tablet TAKE 1 TABLET BY MOUTH EVERY DAY (Patient not taking: No sig reported)   No facility-administered medications prior to visit.    Review of Systems  Constitutional: Negative for appetite change, chills and fever.  Respiratory: Negative for chest tightness,  shortness of breath and wheezing.   Cardiovascular: Negative for chest pain and palpitations.  Gastrointestinal: Negative for abdominal pain, nausea and vomiting.       Objective    BP 104/65 (BP Location: Left Arm, Patient Position: Sitting, Cuff Size: Normal)    Pulse 61    Temp 98 F (36.7 C) (Oral)    Resp 16    Ht 5\' 7"  (1.702 m)    Wt 166 lb (75.3 kg)    SpO2 96%    BMI 26.00 kg/m     Physical Exam Vitals reviewed.  Constitutional:      Appearance: Normal appearance. He is well-developed.  HENT:     Head: Normocephalic and atraumatic.     Right Ear: External ear normal.     Left Ear: External ear normal.     Nose: Nose normal.  Eyes:     General: No scleral icterus.  Conjunctiva/sclera: Conjunctivae normal.  Neck:     Thyroid: No thyromegaly.     Vascular: No carotid bruit.  Cardiovascular:     Rate and Rhythm: Normal rate and regular rhythm.     Pulses: Normal pulses.     Heart sounds: Normal heart sounds.  Pulmonary:     Effort: Pulmonary effort is normal.     Breath sounds: Normal breath sounds.  Abdominal:     Palpations: Abdomen is soft.  Musculoskeletal:     Cervical back: Neck supple.     Right lower leg: No edema.     Left lower leg: No edema.  Lymphadenopathy:     Cervical: No cervical adenopathy.  Skin:    General: Skin is warm and dry.  Neurological:     General: No focal deficit present.     Mental Status: He is alert and oriented to person, place, and time.     Comments: Monofilament foot exam is normal.  Psychiatric:        Mood and Affect: Mood normal.        Behavior: Behavior normal.        Thought Content: Thought content normal.        Judgment: Judgment normal.       No results found for any visits on 04/24/20.  Assessment & Plan     1. Type 2 diabetes mellitus without complication, without long-term current use of insulin (HCC) Goal A1c less than 7.5 and hopefully less than 7. - CBC with Differential/Platelet -  Comprehensive metabolic panel - Hemoglobin A1c - Lipid panel - TSH  2. Essential (primary) hypertension Good control - CBC with Differential/Platelet - Comprehensive metabolic panel - Hemoglobin A1c - Lipid panel - TSH  3. Hypercholesterolemia without hypertriglyceridemia Goal LDL less than 70 - CBC with Differential/Platelet - Comprehensive metabolic panel - Hemoglobin A1c - Lipid panel - TSH  4. CAD in native artery All risk factors treated. - CBC with Differential/Platelet - Comprehensive metabolic panel - Hemoglobin A1c - Lipid panel - TSH   Return in about 4 months (around 08/22/2020).      I, Carl Durie, MD, have reviewed all documentation for this visit. The documentation on 04/24/20 for the exam, diagnosis, procedures, and orders are all accurate and complete.    Chellsie Gomer Cranford Mon, MD  [facility] [phone] (phone) [phone] (fax)  [place]

## 2020-04-24 ENCOUNTER — Encounter: Payer: Self-pay | Admitting: Family Medicine

## 2020-04-24 ENCOUNTER — Other Ambulatory Visit: Payer: Self-pay

## 2020-04-24 ENCOUNTER — Ambulatory Visit (INDEPENDENT_AMBULATORY_CARE_PROVIDER_SITE_OTHER): Payer: Medicare Other | Admitting: Family Medicine

## 2020-04-24 VITALS — BP 104/65 | HR 61 | Temp 98.0°F | Resp 16 | Ht 67.0 in | Wt 166.0 lb

## 2020-04-24 DIAGNOSIS — I2510 Atherosclerotic heart disease of native coronary artery without angina pectoris: Secondary | ICD-10-CM

## 2020-04-24 DIAGNOSIS — I10 Essential (primary) hypertension: Secondary | ICD-10-CM | POA: Diagnosis not present

## 2020-04-24 DIAGNOSIS — E119 Type 2 diabetes mellitus without complications: Secondary | ICD-10-CM | POA: Diagnosis not present

## 2020-04-24 DIAGNOSIS — E7800 Pure hypercholesterolemia, unspecified: Secondary | ICD-10-CM | POA: Diagnosis not present

## 2020-04-25 LAB — TSH: TSH: 2.84 u[IU]/mL (ref 0.450–4.500)

## 2020-04-25 LAB — CBC WITH DIFFERENTIAL/PLATELET
Basophils Absolute: 0 10*3/uL (ref 0.0–0.2)
Basos: 1 %
EOS (ABSOLUTE): 0.2 10*3/uL (ref 0.0–0.4)
Eos: 3 %
Hematocrit: 43.1 % (ref 37.5–51.0)
Hemoglobin: 14.5 g/dL (ref 13.0–17.7)
Immature Grans (Abs): 0 10*3/uL (ref 0.0–0.1)
Immature Granulocytes: 0 %
Lymphocytes Absolute: 1.8 10*3/uL (ref 0.7–3.1)
Lymphs: 22 %
MCH: 31.7 pg (ref 26.6–33.0)
MCHC: 33.6 g/dL (ref 31.5–35.7)
MCV: 94 fL (ref 79–97)
Monocytes Absolute: 0.7 10*3/uL (ref 0.1–0.9)
Monocytes: 8 %
Neutrophils Absolute: 5.5 10*3/uL (ref 1.4–7.0)
Neutrophils: 66 %
Platelets: 172 10*3/uL (ref 150–450)
RBC: 4.57 x10E6/uL (ref 4.14–5.80)
RDW: 12.5 % (ref 11.6–15.4)
WBC: 8.2 10*3/uL (ref 3.4–10.8)

## 2020-04-25 LAB — COMPREHENSIVE METABOLIC PANEL
ALT: 14 IU/L (ref 0–44)
AST: 20 IU/L (ref 0–40)
Albumin/Globulin Ratio: 1.9 (ref 1.2–2.2)
Albumin: 4.5 g/dL (ref 3.7–4.7)
Alkaline Phosphatase: 70 IU/L (ref 44–121)
BUN/Creatinine Ratio: 17 (ref 10–24)
BUN: 20 mg/dL (ref 8–27)
Bilirubin Total: 1.3 mg/dL — ABNORMAL HIGH (ref 0.0–1.2)
CO2: 22 mmol/L (ref 20–29)
Calcium: 9.7 mg/dL (ref 8.6–10.2)
Chloride: 103 mmol/L (ref 96–106)
Creatinine, Ser: 1.2 mg/dL (ref 0.76–1.27)
GFR calc Af Amer: 67 mL/min/{1.73_m2} (ref >59)
GFR calc non Af Amer: 58 mL/min/{1.73_m2} — ABNORMAL LOW (ref >59)
Globulin, Total: 2.4 g/dL (ref 1.5–4.5)
Glucose: 155 mg/dL — ABNORMAL HIGH (ref 65–99)
Potassium: 4.8 mmol/L (ref 3.5–5.2)
Sodium: 140 mmol/L (ref 134–144)
Total Protein: 6.9 g/dL (ref 6.0–8.5)

## 2020-04-25 LAB — LIPID PANEL
Chol/HDL Ratio: 2.6 ratio (ref 0.0–5.0)
Cholesterol, Total: 131 mg/dL (ref 100–199)
HDL: 50 mg/dL (ref >39)
LDL Chol Calc (NIH): 60 mg/dL (ref 0–99)
Triglycerides: 114 mg/dL (ref 0–149)
VLDL Cholesterol Cal: 21 mg/dL (ref 5–40)

## 2020-04-25 LAB — HEMOGLOBIN A1C
Est. average glucose Bld gHb Est-mCnc: 169 mg/dL
Hgb A1c MFr Bld: 7.5 % — ABNORMAL HIGH (ref 4.8–5.6)

## 2020-04-30 ENCOUNTER — Ambulatory Visit: Payer: Medicare Other

## 2020-04-30 DIAGNOSIS — M9905 Segmental and somatic dysfunction of pelvic region: Secondary | ICD-10-CM | POA: Diagnosis not present

## 2020-04-30 DIAGNOSIS — I10 Essential (primary) hypertension: Secondary | ICD-10-CM

## 2020-04-30 DIAGNOSIS — E119 Type 2 diabetes mellitus without complications: Secondary | ICD-10-CM

## 2020-04-30 DIAGNOSIS — M955 Acquired deformity of pelvis: Secondary | ICD-10-CM | POA: Diagnosis not present

## 2020-04-30 DIAGNOSIS — E7849 Other hyperlipidemia: Secondary | ICD-10-CM

## 2020-04-30 DIAGNOSIS — M9903 Segmental and somatic dysfunction of lumbar region: Secondary | ICD-10-CM | POA: Diagnosis not present

## 2020-04-30 DIAGNOSIS — M5441 Lumbago with sciatica, right side: Secondary | ICD-10-CM | POA: Diagnosis not present

## 2020-04-30 DIAGNOSIS — M9902 Segmental and somatic dysfunction of thoracic region: Secondary | ICD-10-CM | POA: Diagnosis not present

## 2020-04-30 DIAGNOSIS — M546 Pain in thoracic spine: Secondary | ICD-10-CM | POA: Diagnosis not present

## 2020-04-30 NOTE — Chronic Care Management (AMB) (Signed)
Chronic Care Management   Follow Up Note   04/30/2020 Name: Carl Dominguez MRN: [id number] DOB: January 04, 1946  Primary Care Provider: Jerrol Banana., MD Reason for referral : Chronic Care Management   Carl Dominguez is a 75 y.o. year old male who is a primary care patient of Jerrol Banana., MD. A telephonic outreach was conducted today.   Review of Carl Dominguez status, including review of consultants reports, relevant labs and test results was conducted today. Collaboration with appropriate care team members was performed as part of the comprehensive evaluation and provision of chronic care management services.    SDOH (Social Determinants of Health) assessments performed: No    Outpatient Encounter Medications as of 04/30/2020  Medication Sig Note  . acetaminophen (TYLENOL) 500 MG tablet Take 500 mg by mouth 2 (two) times daily.   Marland Kitchen aspirin 81 MG tablet Take 81 mg by mouth daily.  11/14/2014: Received from: [facility]  . atorvastatin (LIPITOR) 40 MG tablet TAKE 1 TABLET AT BEDTIME   . cetirizine (ZYRTEC) 10 MG tablet TAKE 1 TABLET BY MOUTH EVERY DAY   . cholecalciferol (VITAMIN D) 1000 UNITS tablet Take 1,000 Units by mouth daily.  11/14/2014: Received from: [facility]  . cholestyramine (QUESTRAN) 4 GM/DOSE powder Take one 4 gram scoop daily.  No other medication 4 hours before or after.   . cyclobenzaprine (FLEXERIL) 10 MG tablet Take 1 tablet (10 mg total) by mouth daily as needed for muscle spasms. (Patient not taking: No sig reported)   . diazepam (VALIUM) 2 MG tablet Take 1 tablet (2 mg total) by mouth every 8 (eight) hours as needed for anxiety.   . famotidine (PEPCID) 20 MG tablet Take 20 mg by mouth daily.  11/14/2014: Received from: [facility]  . fluticasone (FLONASE) 50 MCG/ACT nasal spray Place 2 sprays into both nostrils daily. (Patient taking differently: Place 2 sprays into both nostrils daily. As needed)    . glipiZIDE (GLUCOTROL) 5 MG tablet Take 1 tablet (5 mg total) by mouth daily before breakfast.   . glucose blood (FREESTYLE TEST STRIPS) test strip TEST BLOOD SUGAR EVERY DAY AND AS NEEDED   . ibuprofen (ADVIL,MOTRIN) 200 MG tablet Take 400 mg by mouth every morning.  (Patient not taking: No sig reported) 11/14/2014: Received from: [facility]  . Lactobacillus-Inulin (PROBIOTIC DIGESTIVE SUPPORT PO) Take 1 capsule by mouth daily.   . Lancets (FREESTYLE) lancets USE AS DIRECTED   . latanoprost (XALATAN) 0.005 % ophthalmic solution Place 1 drop into both eyes at bedtime.  11/14/2014: Received from: [facility]  . losartan (COZAAR) 50 MG tablet TAKE 1 TABLET BY MOUTH EVERY DAY (Patient not taking: No sig reported)   . losartan (COZAAR) 50 MG tablet TAKE 1 TABLET BY MOUTH EVERY DAY (Patient not taking: No sig reported)   . losartan (COZAAR) 50 MG tablet Take 1 tablet (50 mg total) by mouth daily.   . metFORMIN (GLUCOPHAGE-XR) 500 MG 24 hr tablet TAKE 1 TABLET(500 MG) BY MOUTH DAILY WITH BREAKFAST   . metoprolol succinate (TOPROL-XL) 25 MG 24 hr tablet TAKE 1 TABLET DAILY   . montelukast (SINGULAIR) 10 MG tablet TAKE 1 TABLET(10 MG) BY MOUTH AT BEDTIME   . naproxen (NAPROSYN) 500 MG tablet Take 1 tablet (500 mg total) by mouth 2 (two) times daily as needed.   . pioglitazone (ACTOS) 45 MG tablet TAKE 1 TABLET DAILY   . psyllium (METAMUCIL SMOOTH TEXTURE) 28 %  packet Take 1 packet by mouth 2 (two) times daily.  11/14/2014: Received from: [facility]  . triamcinolone cream (KENALOG) 0.1 % APPLY TO AFFECTED AREA QD TO BID   . vitamin C (ASCORBIC ACID) 500 MG tablet Take 500 mg by mouth daily.     No facility-administered encounter medications on file as of 04/30/2020.     Objective:  Goals Addressed            This Visit's Progress   . Chronic Disease Management       CARE PLAN ENTRY (see longitudinal plan of care for additional care plan  information)  Current Barriers: . Chronic Disease Management support and education needs related to Hypertension, Hyperlipidemia, Diabetes and GERD.  Case Manager Clinical Goal(s):  Marland Kitchen Over the next 90 days, patient will attend all scheduled medical appointments. -Complete . Over the next 90 days, patient will take all medications as prescribed.-Complete . Over the next 90 days, patient will monitor blood glucose daily and maintain a log.-Complete . Over the next 90 days, patient will continue to engaged in mild exercises and activities as tolerated.-Complete . Over the next 90 days, patient will follow safety recommendations to prevent falls.-Complete . Over the next 60 days, patient will document daily nutritional intake. . Over the next 30 days, patient will notify team if he requires assistance with obtaining needed medical supplies.-Complete  Interventions:  . Inter-disciplinary care team collaboration (see longitudinal plan of care) . Discussed compliance with treatments and plan for care management. Remains compliant with medications, blood glucose monitoring and provider treatment recommendations. No concerns regarding prescription costs. Reports recent home FBS readings have been within range. We discussed his last A1C. In February his A1C was elevated from 6.7 to 7.5 %. He describes this as his normal "hike" after the winter months. He attributes the elevation to his eating habits around the holidays. He is very motivated and confident that his A1C will improve during the Spring months. We discussed optional referrals for diabetes education or nutrition. He reports receiving several needed resources and declined need for additional referrals or assistance. Overall he reports doing very well. He agreed to notify his provider or call if additional outreach is required.   Patient Self Care Activities:  . Self administers medications  . Attends scheduled provider appointments . Calls  pharmacy for medication refills . Performs ADL's independently . Performs IADL's independently . Calls provider office for new concerns or questions   Please see past updates related to this goal by clicking on the "Past Updates" button in the selected goal              PLAN Carl Dominguez agreed to notify his provider or call if additional outreach is needed. The care management team will gladly assist.    [facility] Care Management [facility] [phone]

## 2020-05-05 NOTE — Patient Instructions (Addendum)
  Thank you for allowing the Chronic Care Management team to participate in your care.    Goals Addressed            This Visit's Progress   . Chronic Disease Management       CARE PLAN ENTRY (see longitudinal plan of care for additional care plan information)  Current Barriers: . Chronic Disease Management support and education needs related to Hypertension, Hyperlipidemia, Diabetes and GERD.  Case Manager Clinical Goal(s):  Marland Kitchen Over the next 90 days, patient will attend all scheduled medical appointments. -Complete . Over the next 90 days, patient will take all medications as prescribed.-Complete . Over the next 90 days, patient will monitor blood glucose daily and maintain a log.-Complete . Over the next 90 days, patient will continue to engaged in mild exercises and activities as tolerated.-Complete . Over the next 90 days, patient will follow safety recommendations to prevent falls.-Complete . Over the next 60 days, patient will document daily nutritional intake. . Over the next 30 days, patient will notify team if he requires assistance with obtaining needed medical supplies.-Complete  Interventions:  . Inter-disciplinary care team collaboration (see longitudinal plan of care) . Discussed compliance with treatments and plan for care management. Remains compliant with medications, blood glucose monitoring and provider treatment recommendations. No concerns regarding prescription costs. Reports recent home FBS readings have been within range. We discussed his last A1C. In February his A1C was elevated from 6.7 to 7.5 %. He describes this as his normal "hike" after the winter months. He attributes the elevation to his eating habits around the holidays. He is very motivated and confident that his A1C will improve during the Spring months. We discussed optional referrals for diabetes education or nutrition. He reports receiving several needed resources and declined need for additional  referrals or assistance. Overall he reports doing very well. He agreed to notify his provider or call if additional outreach is required.   Patient Self Care Activities:  . Self administers medications  . Attends scheduled provider appointments . Calls pharmacy for medication refills . Performs ADL's independently . Performs IADL's independently . Calls provider office for new concerns or questions   Please see past updates related to this goal by clicking on the "Past Updates" button in the selected goal              Mr. Wild verbalized understanding of the information discussed during the telephonic outreach today. Declined need for mailed/printed instructions or resources. He agreed to notify his provider or call if additional outreach is needed. The care management team will gladly assist.     [facility] [phone]

## 2020-05-19 DIAGNOSIS — H401131 Primary open-angle glaucoma, bilateral, mild stage: Secondary | ICD-10-CM | POA: Diagnosis not present

## 2020-05-26 DIAGNOSIS — H401112 Primary open-angle glaucoma, right eye, moderate stage: Secondary | ICD-10-CM | POA: Diagnosis not present

## 2020-06-02 ENCOUNTER — Other Ambulatory Visit: Payer: Self-pay | Admitting: Family Medicine

## 2020-06-02 DIAGNOSIS — L570 Actinic keratosis: Secondary | ICD-10-CM | POA: Diagnosis not present

## 2020-06-02 DIAGNOSIS — J302 Other seasonal allergic rhinitis: Secondary | ICD-10-CM

## 2020-06-02 DIAGNOSIS — L814 Other melanin hyperpigmentation: Secondary | ICD-10-CM | POA: Diagnosis not present

## 2020-06-04 DIAGNOSIS — M955 Acquired deformity of pelvis: Secondary | ICD-10-CM | POA: Diagnosis not present

## 2020-06-04 DIAGNOSIS — M5441 Lumbago with sciatica, right side: Secondary | ICD-10-CM | POA: Diagnosis not present

## 2020-06-04 DIAGNOSIS — M546 Pain in thoracic spine: Secondary | ICD-10-CM | POA: Diagnosis not present

## 2020-06-04 DIAGNOSIS — M9905 Segmental and somatic dysfunction of pelvic region: Secondary | ICD-10-CM | POA: Diagnosis not present

## 2020-06-04 DIAGNOSIS — M9902 Segmental and somatic dysfunction of thoracic region: Secondary | ICD-10-CM | POA: Diagnosis not present

## 2020-06-04 DIAGNOSIS — M9903 Segmental and somatic dysfunction of lumbar region: Secondary | ICD-10-CM | POA: Diagnosis not present

## 2020-07-02 ENCOUNTER — Telehealth: Payer: Self-pay

## 2020-07-02 NOTE — Telephone Encounter (Signed)
Patient has already received 3 vaccines, he is high risk with history of HTN and DM. KW

## 2020-07-02 NOTE — Telephone Encounter (Signed)
Copied from [place] [phone]. Topic: General - Other >> Jul 02, 2020 12:06 PM Pawlus, Carl Dominguez wrote: Reason for CRM: Pt wanted to know if Dr Rosanna Randy would recommend the pt get the 2nd covid booster, please advise.

## 2020-07-02 NOTE — Telephone Encounter (Signed)
appt scheduled for 07/23/20. KW

## 2020-07-02 NOTE — Telephone Encounter (Signed)
At some point time in the next couple of months.  His choice.

## 2020-07-07 ENCOUNTER — Telehealth: Payer: Self-pay | Admitting: Family Medicine

## 2020-07-07 DIAGNOSIS — E119 Type 2 diabetes mellitus without complications: Secondary | ICD-10-CM

## 2020-07-07 NOTE — Telephone Encounter (Signed)
Spoke with the patient and he reports that his blood sugars have been running higher than usual. He reports that on LOV we decreased glipizide 10mg  to 5mg  daily. He reports that his blood sugars have averaged in the 180s, and it typically runs in the 130s. He is wondering if we should go back to the 10mg ? He denies any symptoms of feeling lightheaded, dizzy, nausea, headache, or blurred vision. Please advise. Thanks!

## 2020-07-07 NOTE — Telephone Encounter (Signed)
OK to go back up to  10mg 

## 2020-07-07 NOTE — Telephone Encounter (Signed)
Patient called to speak with the nurse or doctor to get his medication for glipizide changed.  Please advise and call to discuss at [phone]

## 2020-07-08 DIAGNOSIS — H401121 Primary open-angle glaucoma, left eye, mild stage: Secondary | ICD-10-CM | POA: Diagnosis not present

## 2020-07-08 DIAGNOSIS — H401112 Primary open-angle glaucoma, right eye, moderate stage: Secondary | ICD-10-CM | POA: Diagnosis not present

## 2020-07-08 MED ORDER — GLIPIZIDE 10 MG PO TABS: 10.0000 mg | ORAL_TABLET | Freq: Every day | ORAL | 1 refills | Status: DC

## 2020-07-08 NOTE — Telephone Encounter (Signed)
Medication sent into the pharmacy. Patient was advised.  

## 2020-07-10 DIAGNOSIS — M9905 Segmental and somatic dysfunction of pelvic region: Secondary | ICD-10-CM | POA: Diagnosis not present

## 2020-07-10 DIAGNOSIS — M546 Pain in thoracic spine: Secondary | ICD-10-CM | POA: Diagnosis not present

## 2020-07-10 DIAGNOSIS — M9902 Segmental and somatic dysfunction of thoracic region: Secondary | ICD-10-CM | POA: Diagnosis not present

## 2020-07-10 DIAGNOSIS — M9903 Segmental and somatic dysfunction of lumbar region: Secondary | ICD-10-CM | POA: Diagnosis not present

## 2020-07-10 DIAGNOSIS — M5441 Lumbago with sciatica, right side: Secondary | ICD-10-CM | POA: Diagnosis not present

## 2020-07-10 DIAGNOSIS — M955 Acquired deformity of pelvis: Secondary | ICD-10-CM | POA: Diagnosis not present

## 2020-07-23 ENCOUNTER — Telehealth: Payer: Self-pay

## 2020-07-23 ENCOUNTER — Other Ambulatory Visit: Payer: Self-pay

## 2020-07-23 ENCOUNTER — Ambulatory Visit (INDEPENDENT_AMBULATORY_CARE_PROVIDER_SITE_OTHER): Payer: Medicare Other

## 2020-07-23 DIAGNOSIS — Z23 Encounter for immunization: Secondary | ICD-10-CM

## 2020-07-23 NOTE — Telephone Encounter (Signed)
Patient wanted Dr. Rosanna Randy to know that the lower dose of Glipizide is working well for him.

## 2020-07-23 NOTE — Telephone Encounter (Signed)
FYI. Thanks.

## 2020-07-31 DIAGNOSIS — M5441 Lumbago with sciatica, right side: Secondary | ICD-10-CM | POA: Diagnosis not present

## 2020-07-31 DIAGNOSIS — M546 Pain in thoracic spine: Secondary | ICD-10-CM | POA: Diagnosis not present

## 2020-07-31 DIAGNOSIS — M9903 Segmental and somatic dysfunction of lumbar region: Secondary | ICD-10-CM | POA: Diagnosis not present

## 2020-07-31 DIAGNOSIS — M955 Acquired deformity of pelvis: Secondary | ICD-10-CM | POA: Diagnosis not present

## 2020-07-31 DIAGNOSIS — M9905 Segmental and somatic dysfunction of pelvic region: Secondary | ICD-10-CM | POA: Diagnosis not present

## 2020-07-31 DIAGNOSIS — M9902 Segmental and somatic dysfunction of thoracic region: Secondary | ICD-10-CM | POA: Diagnosis not present

## 2020-08-14 ENCOUNTER — Telehealth: Payer: Self-pay | Admitting: Family Medicine

## 2020-08-14 NOTE — Telephone Encounter (Signed)
Pt is calling to request if Dr. Rosanna Randy can send information to the insurance company regarding. [place]- [phone]

## 2020-08-23 ENCOUNTER — Other Ambulatory Visit: Payer: Self-pay | Admitting: Family Medicine

## 2020-08-23 DIAGNOSIS — I10 Essential (primary) hypertension: Secondary | ICD-10-CM

## 2020-08-24 NOTE — Telephone Encounter (Signed)
Requested Prescriptions  Pending Prescriptions Disp Refills  . losartan (COZAAR) 50 MG tablet [Pharmacy Med Name: LOSARTAN TAB 50MG ] 90 tablet 0    Sig: TAKE 1 TABLET DAILY     Cardiovascular:  Angiotensin Receptor Blockers Passed - 08/23/2020  7:43 PM      Passed - Cr in normal range and within 180 days    Creatinine, Ser  Date Value Ref Range Status  04/24/2020 1.20 0.76 - 1.27 mg/dL Final    Comment:                   **Effective April 28, 2020 [facility] will begin**                  reporting the 2021 CKD-EPI creatinine equation that                  estimates kidney function without a race variable.          Passed - K in normal range and within 180 days    Potassium  Date Value Ref Range Status  04/24/2020 4.8 3.5 - 5.2 mmol/L Final         Passed - Patient is not pregnant      Passed - Last BP in normal range    BP Readings from Last 1 Encounters:  04/24/20 104/65         Passed - Valid encounter within last 6 months    Recent Outpatient Visits          4 months ago Type 2 diabetes mellitus without complication, without long-term current use of insulin [facility])   [facility] Jerrol Banana., MD   8 months ago Type 2 diabetes mellitus without complication, without long-term current use of insulin [facility])   [facility] Jerrol Banana., MD   11 months ago Strain of lumbar region, initial encounter   [facility] Rosanna Randy, Retia Passe., MD   1 year ago Type 2 diabetes mellitus without complication, without long-term current use of insulin [facility])   [facility] Jerrol Banana., MD   1 year ago Type 2 diabetes mellitus without complication, without long-term current use of insulin [facility])   [facility] Jerrol Banana., MD

## 2020-08-27 ENCOUNTER — Other Ambulatory Visit: Payer: Self-pay | Admitting: Family Medicine

## 2020-08-27 DIAGNOSIS — I10 Essential (primary) hypertension: Secondary | ICD-10-CM

## 2020-09-08 ENCOUNTER — Other Ambulatory Visit: Payer: Self-pay | Admitting: Family Medicine

## 2020-09-08 DIAGNOSIS — E119 Type 2 diabetes mellitus without complications: Secondary | ICD-10-CM

## 2020-09-09 DIAGNOSIS — M9902 Segmental and somatic dysfunction of thoracic region: Secondary | ICD-10-CM | POA: Diagnosis not present

## 2020-09-09 DIAGNOSIS — M9905 Segmental and somatic dysfunction of pelvic region: Secondary | ICD-10-CM | POA: Diagnosis not present

## 2020-09-09 DIAGNOSIS — M955 Acquired deformity of pelvis: Secondary | ICD-10-CM | POA: Diagnosis not present

## 2020-09-09 DIAGNOSIS — M9903 Segmental and somatic dysfunction of lumbar region: Secondary | ICD-10-CM | POA: Diagnosis not present

## 2020-09-09 DIAGNOSIS — M546 Pain in thoracic spine: Secondary | ICD-10-CM | POA: Diagnosis not present

## 2020-09-09 DIAGNOSIS — M5441 Lumbago with sciatica, right side: Secondary | ICD-10-CM | POA: Diagnosis not present

## 2020-09-11 ENCOUNTER — Ambulatory Visit (INDEPENDENT_AMBULATORY_CARE_PROVIDER_SITE_OTHER): Payer: Medicare Other | Admitting: Family Medicine

## 2020-09-11 ENCOUNTER — Other Ambulatory Visit: Payer: Self-pay

## 2020-09-11 ENCOUNTER — Encounter: Payer: Self-pay | Admitting: Family Medicine

## 2020-09-11 VITALS — BP 104/66 | HR 59 | Temp 97.7°F | Ht 67.0 in | Wt 165.0 lb

## 2020-09-11 DIAGNOSIS — E119 Type 2 diabetes mellitus without complications: Secondary | ICD-10-CM

## 2020-09-11 DIAGNOSIS — I10 Essential (primary) hypertension: Secondary | ICD-10-CM

## 2020-09-11 DIAGNOSIS — I2510 Atherosclerotic heart disease of native coronary artery without angina pectoris: Secondary | ICD-10-CM | POA: Diagnosis not present

## 2020-09-11 DIAGNOSIS — E7849 Other hyperlipidemia: Secondary | ICD-10-CM

## 2020-09-11 DIAGNOSIS — Z125 Encounter for screening for malignant neoplasm of prostate: Secondary | ICD-10-CM

## 2020-09-11 MED ORDER — PREDNISONE 20 MG PO TABS: 20.0000 mg | ORAL_TABLET | Freq: Every day | ORAL | 0 refills | Status: DC

## 2020-09-11 NOTE — Progress Notes (Signed)
Annual Wellness Visit     Patient: Carl Dominguez, Male    DOB: [birth year], 75 y.o.   MRN: [id number] Visit Date: 09/11/2020  Today's Provider: [name], MD   Chief Complaint  Patient presents with   Annual Exam   Hand Pain   Subjective    Carl Dominguez is a 75 y.o. male who presents today for his Annual Wellness Visit. He had Medicare wellness in February.  He has generally done well and has medical follow-up today. He reports consuming a general diet. Home exercise routine includes walking 2 hrs per days. He generally feels fairly well. He reports sleeping fairly well. He does not have additional problems to discuss today.   Hand Pain  The incident occurred 5 to 7 days ago. There was no injury mechanism. The pain is present in the left fingers (Left middle finger). The quality of the pain is described as burning. The symptoms are aggravated by palpation and movement.       Medications: Outpatient Medications Prior to Visit  Medication Sig   acetaminophen (TYLENOL) 500 MG tablet Take 500 mg by mouth 2 (two) times daily.   aspirin 81 MG tablet Take 81 mg by mouth daily.    atorvastatin (LIPITOR) 40 MG tablet TAKE 1 TABLET AT BEDTIME   cetirizine (ZYRTEC) 10 MG tablet TAKE 1 TABLET BY MOUTH EVERY DAY   cholecalciferol (VITAMIN D) 1000 UNITS tablet Take 1,000 Units by mouth daily.    cholestyramine (QUESTRAN) 4 GM/DOSE powder Take one 4 gram scoop daily.  No other medication 4 hours before or after.   cyclobenzaprine (FLEXERIL) 10 MG tablet Take 1 tablet (10 mg total) by mouth daily as needed for muscle spasms.   diazepam (VALIUM) 2 MG tablet Take 1 tablet (2 mg total) by mouth every 8 (eight) hours as needed for anxiety.   famotidine (PEPCID) 20 MG tablet Take 20 mg by mouth daily.    fluticasone (FLONASE) 50 MCG/ACT nasal spray Place 2 sprays into both nostrils daily. (Patient taking differently: Place 2 sprays into both nostrils daily. As needed)   glipiZIDE  (GLUCOTROL) 10 MG tablet Take 1 tablet (10 mg total) by mouth daily before breakfast.   glucose blood (FREESTYLE TEST STRIPS) test strip TEST BLOOD SUGAR EVERY DAY AND AS NEEDED   Lactobacillus-Inulin (PROBIOTIC DIGESTIVE SUPPORT PO) Take 1 capsule by mouth daily.   Lancets (FREESTYLE) lancets USE AS DIRECTED   latanoprost (XALATAN) 0.005 % ophthalmic solution Place 1 drop into both eyes at bedtime.    losartan (COZAAR) 50 MG tablet TAKE 1 TABLET BY MOUTH EVERY DAY   metFORMIN (GLUCOPHAGE-XR) 500 MG 24 hr tablet TAKE 1 TABLET(500 MG) BY MOUTH DAILY WITH BREAKFAST   metoprolol succinate (TOPROL-XL) 25 MG 24 hr tablet TAKE 1 TABLET DAILY   montelukast (SINGULAIR) 10 MG tablet TAKE 1 TABLET(10 MG) BY MOUTH AT BEDTIME   naproxen (NAPROSYN) 500 MG tablet Take 1 tablet (500 mg total) by mouth 2 (two) times daily as needed.   pioglitazone (ACTOS) 45 MG tablet TAKE 1 TABLET DAILY   psyllium (METAMUCIL SMOOTH TEXTURE) 28 % packet Take 1 packet by mouth 2 (two) times daily.    triamcinolone cream (KENALOG) 0.1 % APPLY TO AFFECTED AREA QD TO BID   vitamin C (ASCORBIC ACID) 500 MG tablet Take 500 mg by mouth daily.    ibuprofen (ADVIL,MOTRIN) 200 MG tablet Take 400 mg by mouth every morning.   losartan (COZAAR) 50 MG tablet  TAKE 1 TABLET BY MOUTH EVERY DAY   losartan (COZAAR) 50 MG tablet TAKE 1 TABLET DAILY   No facility-administered medications prior to visit.    No Known Allergies  Patient Care Team: Jerrol Banana., MD as PCP - General (Family Medicine) Leandrew Koyanagi, MD as Referring Physician (Ophthalmology) Richmond Campbell, MD as Consulting Physician (Gastroenterology) Yolonda Kida, MD as Consulting Physician (Cardiology)  Review of Systems  Constitutional: Negative.   HENT: Negative.    Eyes: Negative.   Respiratory: Negative.    Cardiovascular: Negative.   Gastrointestinal: Negative.   Endocrine: Negative.   Genitourinary:  Positive for frequency. Negative for  decreased urine volume, difficulty urinating, dysuria, enuresis, flank pain, genital sores, hematuria, penile discharge, penile pain, penile swelling, scrotal swelling, testicular pain and urgency.  Musculoskeletal: Negative.   Skin: Negative.   Allergic/Immunologic: Negative.   Neurological: Negative.   Hematological: Negative.   Psychiatric/Behavioral: Negative.         Objective    Vitals: BP 104/66 (BP Location: Right Arm, Patient Position: Sitting, Cuff Size: Large)   Pulse (!) 59   Temp 97.7 F (36.5 C) (Oral)   Ht 5\' 7"  (1.702 m)   Wt 165 lb (74.8 kg)   SpO2 98%   BMI 25.84 kg/m    Physical Exam Vitals reviewed.  Constitutional:      Appearance: Normal appearance. He is well-developed.  HENT:     Head: Normocephalic and atraumatic.     Right Ear: External ear normal.     Left Ear: External ear normal.     Nose: Nose normal.  Eyes:     General: No scleral icterus.    Conjunctiva/sclera: Conjunctivae normal.  Neck:     Thyroid: No thyromegaly.     Vascular: No carotid bruit.  Cardiovascular:     Rate and Rhythm: Normal rate and regular rhythm.     Pulses: Normal pulses.     Heart sounds: Normal heart sounds.  Pulmonary:     Effort: Pulmonary effort is normal.     Breath sounds: Normal breath sounds.  Abdominal:     Palpations: Abdomen is soft.  Genitourinary:    Penis: Normal.      Testes: Normal.  Musculoskeletal:     Cervical back: Neck supple.     Right lower leg: No edema.     Left lower leg: No edema.  Lymphadenopathy:     Cervical: No cervical adenopathy.  Skin:    General: Skin is warm and dry.  Neurological:     General: No focal deficit present.     Mental Status: He is alert and oriented to person, place, and time.     Comments: Monofilament foot exam is normal.  Psychiatric:        Mood and Affect: Mood normal.        Behavior: Behavior normal.        Thought Content: Thought content normal.        Judgment: Judgment normal.      Most recent functional status assessment: In your present state of health, do you have any difficulty performing the following activities: 09/11/2020  Hearing? Y  Vision? N  Difficulty concentrating or making decisions? Y  Walking or climbing stairs? N  Comment -  Dressing or bathing? N  Doing errands, shopping? N  Preparing Food and eating ? -  Using the Toilet? -  In the past six months, have you accidently leaked urine? -  Comment -  Do you have problems with loss of bowel control? -  Managing your Medications? -  Managing your Finances? -  Housekeeping or managing your Housekeeping? -  Some recent data might be hidden   Most recent fall risk assessment: Fall Risk  09/11/2020  Falls in the past year? 0  Number falls in past yr: 0  Injury with Fall? 0  Risk for fall due to : No Fall Risks  Follow up Falls evaluation completed    Most recent depression screenings: PHQ 2/9 Scores 09/11/2020 04/24/2020  PHQ - 2 Score 0 0  PHQ- 9 Score 0 0   Most recent cognitive screening: 6CIT Screen 03/22/2018  What Year? 0 points  What month? 0 points  What time? 0 points  Count back from 20 0 points  Months in reverse 0 points  Repeat phrase 0 points  Total Score 0   Most recent Audit-C alcohol use screening Alcohol Use Disorder Test (AUDIT) 09/11/2020  1. How often do you have a drink containing alcohol? 2  2. How many drinks containing alcohol do you have on a typical day when you are drinking? 0  3. How often do you have six or more drinks on one occasion? 0  AUDIT-C Score 2  Alcohol Brief Interventions/Follow-up -   A score of 3 or more in women, and 4 or more in men indicates increased risk for alcohol abuse, EXCEPT if all of the points are from question 1   No results found for any visits on 09/11/20.  Assessment & Plan     Annual wellness visit done today including the all of the following: Reviewed patient's Family Medical History Reviewed and updated list of  patient's medical providers Assessment of cognitive impairment was done Assessed patient's functional ability Established a written schedule for health screening [place] Completed and Reviewed  Exercise Activities and Dietary recommendations  Goals      Chronic Disease Management     CARE PLAN ENTRY (see longitudinal plan of care for additional care plan information)  Current Barriers: Chronic Disease Management support and education needs related to Hypertension, Hyperlipidemia, Diabetes and GERD.  Case Manager Clinical Goal(s):  Over the next 90 days, patient will attend all scheduled medical appointments. -Complete Over the next 90 days, patient will take all medications as prescribed.-Complete Over the next 90 days, patient will monitor blood glucose daily and maintain a log.-Complete Over the next 90 days, patient will continue to engaged in mild exercises and activities as tolerated.-Complete Over the next 90 days, patient will follow safety recommendations to prevent falls.-Complete Over the next 60 days, patient will document daily nutritional intake. Over the next 30 days, patient will notify team if he requires assistance with obtaining needed medical supplies.-Complete  Interventions:  Inter-disciplinary care team collaboration (see longitudinal plan of care) Discussed compliance with treatments and plan for care management. Remains compliant with medications, blood glucose monitoring and provider treatment recommendations. No concerns regarding prescription costs. Reports recent home FBS readings have been within range. We discussed his last A1C. In February his A1C was elevated from 6.7 to 7.5 %. He describes this as his normal "hike" after the winter months. He attributes the elevation to his eating habits around the holidays. He is very motivated and confident that his A1C will improve during the Spring months. We discussed optional referrals for  diabetes education or nutrition. He reports receiving several needed resources and declined need for additional referrals or assistance. Overall he reports  doing very well. He agreed to notify his provider or call if additional outreach is required.   Patient Self Care Activities:  Self administers medications  Attends scheduled provider appointments Calls pharmacy for medication refills Performs ADL's independently Performs IADL's independently Calls provider office for new concerns or questions   Please see past updates related to this goal by clicking on the "Past Updates" button in the selected goal       DIET - INCREASE WATER INTAKE     Recommend increasing water intake to 4 glasses a day.       Exercise 3x per week (30 min per time)     Recommend to exercise for 3 days a week for at least 30 minutes at a time.          Immunization History  Administered Date(s) Administered   Influenza, High Dose Seasonal PF 12/01/2015, 12/03/2016, 12/06/2017, 12/12/2019   Influenza-Unspecified 11/30/2014, 12/12/2018   PFIZER Comirnaty(Gray Top)Covid-19 Tri-Sucrose Vaccine 07/23/2020   PFIZER(Purple Top)SARS-COV-2 Vaccination 03/07/2019, 03/28/2019, 01/17/2020   Pneumococcal Conjugate-13 12/07/2012, 03/14/2014   Pneumococcal Polysaccharide-23 02/03/2005, 12/13/2008   Tdap 12/06/2007, 08/30/2018   Zoster Recombinat (Shingrix) 12/03/2016, 04/14/2017   Zoster, Live 12/31/2010    Health Maintenance  Topic Date Due   INFLUENZA VACCINE  09/29/2020   HEMOGLOBIN A1C  10/22/2020   OPHTHALMOLOGY EXAM  11/19/2020   COVID-19 Vaccine (5 - Booster for [place] series) 11/23/2020   FOOT EXAM  04/24/2021   COLONOSCOPY (Pts 45-73yrs Insurance coverage will need to be confirmed)  04/04/2022   TETANUS/TDAP  08/29/2028   Hepatitis C Screening  Completed   PNA vac Low Risk Adult  Completed   Zoster Vaccines- Shingrix  Completed   HPV VACCINES  Aged Out     Discussed health benefits of physical  activity, and encouraged him to engage in regular exercise appropriate for his age and condition.    1. Type 2 diabetes mellitus without complication, without long-term current use of insulin (HCC) Goal A1c 7.5-8.  Consider cutting back or stopping glipizide in the future. - Comprehensive metabolic panel - Hemoglobin A1c  2. Essential (primary) hypertension Well-controlled.  On losartan and metoprolol. - CBC with Differential/Platelet - TSH  3. Other hyperlipidemia On atorvastatin 40 - Lipid panel  4. Screening for prostate cancer   5. CAD in native artery All risk factors.   No follow-ups on file.     I, [name], MD, have reviewed all documentation for this visit. The documentation on 09/19/20 for the exam, diagnosis, procedures, and orders are all accurate and complete.    Esteven Overfelt Cranford Mon, MD  [facility] [phone] (phone) [phone] (fax)  [place]

## 2020-09-15 DIAGNOSIS — E7849 Other hyperlipidemia: Secondary | ICD-10-CM | POA: Diagnosis not present

## 2020-09-15 DIAGNOSIS — E119 Type 2 diabetes mellitus without complications: Secondary | ICD-10-CM | POA: Diagnosis not present

## 2020-09-15 DIAGNOSIS — I10 Essential (primary) hypertension: Secondary | ICD-10-CM | POA: Diagnosis not present

## 2020-09-16 LAB — COMPREHENSIVE METABOLIC PANEL
ALT: 18 IU/L (ref 0–44)
AST: 15 IU/L (ref 0–40)
Albumin/Globulin Ratio: 1.7 (ref 1.2–2.2)
Albumin: 3.9 g/dL (ref 3.7–4.7)
Alkaline Phosphatase: 75 IU/L (ref 44–121)
BUN/Creatinine Ratio: 17 (ref 10–24)
BUN: 22 mg/dL (ref 8–27)
Bilirubin Total: 0.9 mg/dL (ref 0.0–1.2)
CO2: 21 mmol/L (ref 20–29)
Calcium: 9.3 mg/dL (ref 8.6–10.2)
Chloride: 106 mmol/L (ref 96–106)
Creatinine, Ser: 1.27 mg/dL (ref 0.76–1.27)
Globulin, Total: 2.3 g/dL (ref 1.5–4.5)
Glucose: 85 mg/dL (ref 65–99)
Potassium: 4.6 mmol/L (ref 3.5–5.2)
Sodium: 144 mmol/L (ref 134–144)
Total Protein: 6.2 g/dL (ref 6.0–8.5)
eGFR: 58 mL/min/{1.73_m2} — ABNORMAL LOW (ref >59)

## 2020-09-16 LAB — HEMOGLOBIN A1C
Est. average glucose Bld gHb Est-mCnc: 166 mg/dL
Hgb A1c MFr Bld: 7.4 % — ABNORMAL HIGH (ref 4.8–5.6)

## 2020-09-16 LAB — LIPID PANEL
Chol/HDL Ratio: 2.5 ratio (ref 0.0–5.0)
Cholesterol, Total: 126 mg/dL (ref 100–199)
HDL: 50 mg/dL (ref >39)
LDL Chol Calc (NIH): 53 mg/dL (ref 0–99)
Triglycerides: 134 mg/dL (ref 0–149)
VLDL Cholesterol Cal: 23 mg/dL (ref 5–40)

## 2020-09-16 LAB — CBC WITH DIFFERENTIAL/PLATELET
Basophils Absolute: 0 10*3/uL (ref 0.0–0.2)
Basos: 0 %
EOS (ABSOLUTE): 0.2 10*3/uL (ref 0.0–0.4)
Eos: 1 %
Hematocrit: 38.4 % (ref 37.5–51.0)
Hemoglobin: 12.9 g/dL — ABNORMAL LOW (ref 13.0–17.7)
Immature Grans (Abs): 0.1 10*3/uL (ref 0.0–0.1)
Immature Granulocytes: 1 %
Lymphocytes Absolute: 3.6 10*3/uL — ABNORMAL HIGH (ref 0.7–3.1)
Lymphs: 31 %
MCH: 31.9 pg (ref 26.6–33.0)
MCHC: 33.6 g/dL (ref 31.5–35.7)
MCV: 95 fL (ref 79–97)
Monocytes Absolute: 1.1 10*3/uL — ABNORMAL HIGH (ref 0.1–0.9)
Monocytes: 9 %
Neutrophils Absolute: 6.7 10*3/uL (ref 1.4–7.0)
Neutrophils: 58 %
Platelets: 166 10*3/uL (ref 150–450)
RBC: 4.04 x10E6/uL — ABNORMAL LOW (ref 4.14–5.80)
RDW: 12.6 % (ref 11.6–15.4)
WBC: 11.6 10*3/uL — ABNORMAL HIGH (ref 3.4–10.8)

## 2020-09-16 LAB — TSH: TSH: 3.76 u[IU]/mL (ref 0.450–4.500)

## 2020-10-07 DIAGNOSIS — M9903 Segmental and somatic dysfunction of lumbar region: Secondary | ICD-10-CM | POA: Diagnosis not present

## 2020-10-07 DIAGNOSIS — M955 Acquired deformity of pelvis: Secondary | ICD-10-CM | POA: Diagnosis not present

## 2020-10-07 DIAGNOSIS — M5441 Lumbago with sciatica, right side: Secondary | ICD-10-CM | POA: Diagnosis not present

## 2020-10-07 DIAGNOSIS — M546 Pain in thoracic spine: Secondary | ICD-10-CM | POA: Diagnosis not present

## 2020-10-07 DIAGNOSIS — M9902 Segmental and somatic dysfunction of thoracic region: Secondary | ICD-10-CM | POA: Diagnosis not present

## 2020-10-07 DIAGNOSIS — M9905 Segmental and somatic dysfunction of pelvic region: Secondary | ICD-10-CM | POA: Diagnosis not present

## 2020-10-14 DIAGNOSIS — I779 Disorder of arteries and arterioles, unspecified: Secondary | ICD-10-CM | POA: Diagnosis not present

## 2020-10-14 DIAGNOSIS — Z136 Encounter for screening for cardiovascular disorders: Secondary | ICD-10-CM | POA: Diagnosis not present

## 2020-10-14 DIAGNOSIS — R001 Bradycardia, unspecified: Secondary | ICD-10-CM | POA: Diagnosis not present

## 2020-10-14 DIAGNOSIS — E7800 Pure hypercholesterolemia, unspecified: Secondary | ICD-10-CM | POA: Diagnosis not present

## 2020-10-14 DIAGNOSIS — I10 Essential (primary) hypertension: Secondary | ICD-10-CM | POA: Diagnosis not present

## 2020-10-14 DIAGNOSIS — E119 Type 2 diabetes mellitus without complications: Secondary | ICD-10-CM | POA: Diagnosis not present

## 2020-10-14 DIAGNOSIS — I2510 Atherosclerotic heart disease of native coronary artery without angina pectoris: Secondary | ICD-10-CM | POA: Diagnosis not present

## 2020-10-14 DIAGNOSIS — K219 Gastro-esophageal reflux disease without esophagitis: Secondary | ICD-10-CM | POA: Diagnosis not present

## 2020-10-14 DIAGNOSIS — Z955 Presence of coronary angioplasty implant and graft: Secondary | ICD-10-CM | POA: Diagnosis not present

## 2020-10-14 DIAGNOSIS — R931 Abnormal findings on diagnostic imaging of heart and coronary circulation: Secondary | ICD-10-CM | POA: Diagnosis not present

## 2020-10-27 ENCOUNTER — Telehealth: Payer: Self-pay | Admitting: Family Medicine

## 2020-10-27 DIAGNOSIS — E119 Type 2 diabetes mellitus without complications: Secondary | ICD-10-CM

## 2020-10-27 MED ORDER — GLIPIZIDE 10 MG PO TABS: 10.0000 mg | ORAL_TABLET | Freq: Every day | ORAL | 1 refills | Status: DC

## 2020-10-27 NOTE — Telephone Encounter (Signed)
[facility] faxed refill request for the following medications:    glipiZIDE (GLUCOTROL) 10 MG tablet   Please advise.

## 2020-10-27 NOTE — Telephone Encounter (Signed)
Rx was sent to pharmacy. 

## 2020-10-28 ENCOUNTER — Telehealth: Payer: Self-pay | Admitting: Family Medicine

## 2020-10-28 MED ORDER — LOSARTAN POTASSIUM 50 MG PO TABS: 50.0000 mg | ORAL_TABLET | Freq: Every day | ORAL | 1 refills | Status: DC

## 2020-10-28 NOTE — Telephone Encounter (Signed)
[facility] faxed refill request for the following medications:    losartan (COZAAR) 50 MG tablet   Please advise.

## 2020-11-11 DIAGNOSIS — M9902 Segmental and somatic dysfunction of thoracic region: Secondary | ICD-10-CM | POA: Diagnosis not present

## 2020-11-11 DIAGNOSIS — M955 Acquired deformity of pelvis: Secondary | ICD-10-CM | POA: Diagnosis not present

## 2020-11-11 DIAGNOSIS — M5441 Lumbago with sciatica, right side: Secondary | ICD-10-CM | POA: Diagnosis not present

## 2020-11-11 DIAGNOSIS — M9905 Segmental and somatic dysfunction of pelvic region: Secondary | ICD-10-CM | POA: Diagnosis not present

## 2020-11-11 DIAGNOSIS — M546 Pain in thoracic spine: Secondary | ICD-10-CM | POA: Diagnosis not present

## 2020-11-11 DIAGNOSIS — M9903 Segmental and somatic dysfunction of lumbar region: Secondary | ICD-10-CM | POA: Diagnosis not present

## 2020-11-13 DIAGNOSIS — I779 Disorder of arteries and arterioles, unspecified: Secondary | ICD-10-CM | POA: Diagnosis not present

## 2020-11-13 DIAGNOSIS — I2510 Atherosclerotic heart disease of native coronary artery without angina pectoris: Secondary | ICD-10-CM | POA: Diagnosis not present

## 2020-11-13 DIAGNOSIS — I6523 Occlusion and stenosis of bilateral carotid arteries: Secondary | ICD-10-CM | POA: Diagnosis not present

## 2020-11-13 DIAGNOSIS — R931 Abnormal findings on diagnostic imaging of heart and coronary circulation: Secondary | ICD-10-CM | POA: Diagnosis not present

## 2020-11-25 DIAGNOSIS — Z955 Presence of coronary angioplasty implant and graft: Secondary | ICD-10-CM | POA: Diagnosis not present

## 2020-11-25 DIAGNOSIS — E7800 Pure hypercholesterolemia, unspecified: Secondary | ICD-10-CM | POA: Diagnosis not present

## 2020-11-25 DIAGNOSIS — R001 Bradycardia, unspecified: Secondary | ICD-10-CM | POA: Diagnosis not present

## 2020-11-25 DIAGNOSIS — I519 Heart disease, unspecified: Secondary | ICD-10-CM | POA: Diagnosis not present

## 2020-11-25 DIAGNOSIS — I2510 Atherosclerotic heart disease of native coronary artery without angina pectoris: Secondary | ICD-10-CM | POA: Diagnosis not present

## 2020-11-25 DIAGNOSIS — K219 Gastro-esophageal reflux disease without esophagitis: Secondary | ICD-10-CM | POA: Diagnosis not present

## 2020-11-25 DIAGNOSIS — Z136 Encounter for screening for cardiovascular disorders: Secondary | ICD-10-CM | POA: Diagnosis not present

## 2020-11-25 DIAGNOSIS — E119 Type 2 diabetes mellitus without complications: Secondary | ICD-10-CM | POA: Diagnosis not present

## 2020-11-25 DIAGNOSIS — I10 Essential (primary) hypertension: Secondary | ICD-10-CM | POA: Diagnosis not present

## 2020-11-26 ENCOUNTER — Other Ambulatory Visit: Payer: Self-pay | Admitting: Family Medicine

## 2020-11-26 DIAGNOSIS — J301 Allergic rhinitis due to pollen: Secondary | ICD-10-CM

## 2020-12-01 DIAGNOSIS — L570 Actinic keratosis: Secondary | ICD-10-CM | POA: Diagnosis not present

## 2020-12-01 DIAGNOSIS — L814 Other melanin hyperpigmentation: Secondary | ICD-10-CM | POA: Diagnosis not present

## 2020-12-01 DIAGNOSIS — D1801 Hemangioma of skin and subcutaneous tissue: Secondary | ICD-10-CM | POA: Diagnosis not present

## 2020-12-01 DIAGNOSIS — L578 Other skin changes due to chronic exposure to nonionizing radiation: Secondary | ICD-10-CM | POA: Diagnosis not present

## 2020-12-08 DIAGNOSIS — Z23 Encounter for immunization: Secondary | ICD-10-CM | POA: Diagnosis not present

## 2020-12-09 DIAGNOSIS — M9902 Segmental and somatic dysfunction of thoracic region: Secondary | ICD-10-CM | POA: Diagnosis not present

## 2020-12-09 DIAGNOSIS — M9905 Segmental and somatic dysfunction of pelvic region: Secondary | ICD-10-CM | POA: Diagnosis not present

## 2020-12-09 DIAGNOSIS — M9903 Segmental and somatic dysfunction of lumbar region: Secondary | ICD-10-CM | POA: Diagnosis not present

## 2020-12-09 DIAGNOSIS — M546 Pain in thoracic spine: Secondary | ICD-10-CM | POA: Diagnosis not present

## 2020-12-09 DIAGNOSIS — M5441 Lumbago with sciatica, right side: Secondary | ICD-10-CM | POA: Diagnosis not present

## 2020-12-09 DIAGNOSIS — M955 Acquired deformity of pelvis: Secondary | ICD-10-CM | POA: Diagnosis not present

## 2020-12-17 ENCOUNTER — Ambulatory Visit (INDEPENDENT_AMBULATORY_CARE_PROVIDER_SITE_OTHER): Payer: Medicare Other | Admitting: Family Medicine

## 2020-12-17 ENCOUNTER — Other Ambulatory Visit: Payer: Self-pay

## 2020-12-17 ENCOUNTER — Encounter: Payer: Self-pay | Admitting: Family Medicine

## 2020-12-17 VITALS — BP 138/71 | HR 64 | Temp 98.5°F | Resp 16 | Ht 67.0 in | Wt 161.0 lb

## 2020-12-17 DIAGNOSIS — I10 Essential (primary) hypertension: Secondary | ICD-10-CM

## 2020-12-17 DIAGNOSIS — E7800 Pure hypercholesterolemia, unspecified: Secondary | ICD-10-CM | POA: Diagnosis not present

## 2020-12-17 DIAGNOSIS — I2510 Atherosclerotic heart disease of native coronary artery without angina pectoris: Secondary | ICD-10-CM

## 2020-12-17 DIAGNOSIS — E119 Type 2 diabetes mellitus without complications: Secondary | ICD-10-CM

## 2020-12-17 DIAGNOSIS — J069 Acute upper respiratory infection, unspecified: Secondary | ICD-10-CM

## 2020-12-17 LAB — POCT GLYCOSYLATED HEMOGLOBIN (HGB A1C)
Est. average glucose Bld gHb Est-mCnc: 146
Hemoglobin A1C: 6.7 % — AB (ref 4.0–5.6)

## 2020-12-17 NOTE — Progress Notes (Signed)
I,[name],acting as a scribe for [name], MD.,have documented all relevant documentation on the behalf of [name], MD,as directed by  [name], MD while in the presence of [name], MD.   Established patient visit   Patient: Carl Dominguez   DOB: 04/29/1945   75 y.o. Male  MRN: [id number] Visit Date: 12/17/2020  Today's healthcare provider: [name], MD   Chief Complaint  Patient presents with   Follow-up   Diabetes   Subjective    HPI  Patient comes in today for follow-up.  He has been feeling fairly well except for sinus congestion and postnasal drainage.  He has tested negative for COVID.  He is not feeling sick. Diabetes Mellitus Type II, Follow-up  Lab Results  Component Value Date   HGBA1C 7.4 (H) 09/15/2020   HGBA1C 7.5 (H) 04/24/2020   HGBA1C 6.7 (A) 12/26/2019   Wt Readings from Last 3 Encounters:  12/17/20 161 lb (73 kg)  09/11/20 165 lb (74.8 kg)  04/24/20 166 lb (75.3 kg)   Last seen for diabetes 3 months ago.  Management since then includes none. He reports good compliance with treatment. He is not having side effects. none Home blood sugar records: fasting range: 124-159  Episodes of hypoglycemia? No none   Current insulin regiment: n/a Most Recent Eye Exam: [facility] Current exercise: walking Current diet habits: well balanced  Pertinent Labs: Lab Results  Component Value Date   CHOL 126 09/15/2020   HDL 50 09/15/2020   LDLCALC 53 09/15/2020   TRIG 134 09/15/2020   CHOLHDL 2.5 09/15/2020   Lab Results  Component Value Date   NA 144 09/15/2020   K 4.6 09/15/2020   CREATININE 1.27 09/15/2020   EGFR 58 (L) 09/15/2020   GFRNONAA 58 (L) 04/24/2020   GLUCOSE 85 09/15/2020     ---------------------------------------------------------------------------------------------------    Medications: Outpatient Medications Prior to Visit  Medication Sig   acetaminophen (TYLENOL) 500  MG tablet Take 500 mg by mouth 2 (two) times daily.   aspirin 81 MG tablet Take 81 mg by mouth daily.    atorvastatin (LIPITOR) 40 MG tablet TAKE 1 TABLET AT BEDTIME   cetirizine (ZYRTEC) 10 MG tablet TAKE 1 TABLET BY MOUTH EVERY DAY   cholecalciferol (VITAMIN D) 1000 UNITS tablet Take 1,000 Units by mouth daily.    cholestyramine (QUESTRAN) 4 GM/DOSE powder Take one 4 gram scoop daily.  No other medication 4 hours before or after.   famotidine (PEPCID) 20 MG tablet Take 20 mg by mouth daily.    fluticasone (FLONASE) 50 MCG/ACT nasal spray Place 2 sprays into both nostrils daily. (Patient taking differently: Place 2 sprays into both nostrils daily. As needed)   glipiZIDE (GLUCOTROL) 10 MG tablet Take 1 tablet (10 mg total) by mouth daily before breakfast.   glucose blood (FREESTYLE TEST STRIPS) test strip TEST BLOOD SUGAR EVERY DAY AND AS NEEDED   Lactobacillus-Inulin (PROBIOTIC DIGESTIVE SUPPORT PO) Take 1 capsule by mouth daily.   Lancets (FREESTYLE) lancets USE AS DIRECTED   latanoprost (XALATAN) 0.005 % ophthalmic solution Place 1 drop into both eyes at bedtime.    losartan (COZAAR) 50 MG tablet TAKE 1 TABLET BY MOUTH EVERY DAY   metFORMIN (GLUCOPHAGE-XR) 500 MG 24 hr tablet TAKE 1 TABLET(500 MG) BY MOUTH DAILY WITH BREAKFAST   metoprolol succinate (TOPROL-XL) 25 MG 24 hr tablet TAKE 1 TABLET DAILY   montelukast (SINGULAIR) 10 MG tablet TAKE 1  TABLET(10 MG) BY MOUTH AT BEDTIME   pioglitazone (ACTOS) 45 MG tablet TAKE 1 TABLET DAILY   psyllium (METAMUCIL SMOOTH TEXTURE) 28 % packet Take 1 packet by mouth 2 (two) times daily.    timolol (TIMOPTIC) 0.5 % ophthalmic solution 1 drop every morning.   vitamin C (ASCORBIC ACID) 500 MG tablet Take 500 mg by mouth daily.    cyclobenzaprine (FLEXERIL) 10 MG tablet Take 1 tablet (10 mg total) by mouth daily as needed for muscle spasms. (Patient not taking: Reported on 12/17/2020)   diazepam (VALIUM) 2 MG tablet Take 1 tablet (2 mg total) by mouth every  8 (eight) hours as needed for anxiety. (Patient not taking: Reported on 12/17/2020)   ibuprofen (ADVIL,MOTRIN) 200 MG tablet Take 400 mg by mouth every morning. (Patient not taking: Reported on 12/17/2020)   [DISCONTINUED] losartan (COZAAR) 50 MG tablet TAKE 1 TABLET DAILY (Patient not taking: Reported on 12/17/2020)   [DISCONTINUED] losartan (COZAAR) 50 MG tablet Take 1 tablet (50 mg total) by mouth daily. (Patient not taking: Reported on 12/17/2020)   [DISCONTINUED] naproxen (NAPROSYN) 500 MG tablet Take 1 tablet (500 mg total) by mouth 2 (two) times daily as needed. (Patient not taking: Reported on 12/17/2020)   [DISCONTINUED] predniSONE (DELTASONE) 20 MG tablet Take 1 tablet (20 mg total) by mouth daily with breakfast. (Patient not taking: Reported on 12/17/2020)   [DISCONTINUED] triamcinolone cream (KENALOG) 0.1 % APPLY TO AFFECTED AREA QD TO BID (Patient not taking: Reported on 12/17/2020)   No facility-administered medications prior to visit.    Review of Systems  Last hemoglobin A1c Lab Results  Component Value Date   HGBA1C 6.7 (A) 12/17/2020       Objective    BP 138/71 (BP Location: Left Arm, Patient Position: Sitting, Cuff Size: Large)   Pulse 64   Temp 98.5 F (36.9 C) (Temporal)   Resp 16   Ht $R'5\' 7"'hJ$  (1.702 m)   Wt 161 lb (73 kg)   SpO2 97%   BMI 25.22 kg/m  BP Readings from Last 3 Encounters:  12/17/20 138/71  09/11/20 104/66  04/24/20 104/65   Wt Readings from Last 3 Encounters:  12/17/20 161 lb (73 kg)  09/11/20 165 lb (74.8 kg)  04/24/20 166 lb (75.3 kg)      Physical Exam Vitals reviewed.  Constitutional:      Appearance: Normal appearance. He is well-developed.  HENT:     Head: Normocephalic and atraumatic.     Right Ear: External ear normal.     Left Ear: External ear normal.     Nose: Nose normal.  Eyes:     General: No scleral icterus.    Conjunctiva/sclera: Conjunctivae normal.  Neck:     Thyroid: No thyromegaly.     Vascular: No carotid  bruit.  Cardiovascular:     Rate and Rhythm: Normal rate and regular rhythm.     Pulses: Normal pulses.     Heart sounds: Normal heart sounds.  Pulmonary:     Effort: Pulmonary effort is normal.     Breath sounds: Normal breath sounds.  Abdominal:     Palpations: Abdomen is soft.  Musculoskeletal:     Cervical back: Neck supple.     Right lower leg: No edema.     Left lower leg: No edema.  Lymphadenopathy:     Cervical: No cervical adenopathy.  Skin:    General: Skin is warm and dry.  Neurological:     General: No focal deficit present.  Mental Status: He is alert and oriented to person, place, and time.     Comments: Monofilament foot exam is normal.  Psychiatric:        Mood and Affect: Mood normal.        Behavior: Behavior normal.        Thought Content: Thought content normal.        Judgment: Judgment normal.      No results found for any visits on 12/17/20.  Assessment & Plan     1. Type 2 diabetes mellitus without complication, without long-term current use of insulin (HCC) A1c is gone from 7.4-6.7.  No hypoglycemia but would be quick to take away or decrease glipizide.  Patient does not wish to stop anything presently. - POCT glycosylated hemoglobin (Hb A1C)  2. CAD in native artery All risk factors treated.  3. Hypercholesterolemia without hypertriglyceridemia Patient on atorvastatin 40  4. Viral upper respiratory tract infection No treatment other than Robitussin twice a day until symptoms resolve.  5. Essential (primary) hypertension Well-controlled on losartan metoprolol   No follow-ups on file.      I, [name], MD, have reviewed all documentation for this visit. The documentation on 12/23/20 for the exam, diagnosis, procedures, and orders are all accurate and complete.    Dalten Ambrosino Cranford Mon, MD  [facility] [phone] (phone) [phone] (fax)  [place]

## 2020-12-17 NOTE — Patient Instructions (Signed)
Over the counter robitussin twice daily

## 2020-12-19 ENCOUNTER — Other Ambulatory Visit: Payer: Self-pay | Admitting: Family Medicine

## 2020-12-19 DIAGNOSIS — I10 Essential (primary) hypertension: Secondary | ICD-10-CM

## 2020-12-19 NOTE — Telephone Encounter (Signed)
Requested Prescriptions  Pending Prescriptions Disp Refills  . atorvastatin (LIPITOR) 40 MG tablet [Pharmacy Med Name: ATORVASTATIN TAB 40MG ] 90 tablet 1    Sig: TAKE 1 TABLET AT BEDTIME     Cardiovascular:  Antilipid - Statins Passed - 12/19/2020  8:14 AM      Passed - Total Cholesterol in normal range and within 360 days    Cholesterol, Total  Date Value Ref Range Status  09/15/2020 126 100 - 199 mg/dL Final         Passed - LDL in normal range and within 360 days    LDL Chol Calc (NIH)  Date Value Ref Range Status  09/15/2020 53 0 - 99 mg/dL Final         Passed - HDL in normal range and within 360 days    HDL  Date Value Ref Range Status  09/15/2020 50 >39 mg/dL Final         Passed - Triglycerides in normal range and within 360 days    Triglycerides  Date Value Ref Range Status  09/15/2020 134 0 - 149 mg/dL Final         Passed - Patient is not pregnant      Passed - Valid encounter within last 12 months    Recent Outpatient Visits          2 days ago Type 2 diabetes mellitus without complication, without long-term current use of insulin ([place])   [facility] Jerrol Banana., MD   3 months ago Type 2 diabetes mellitus without complication, without long-term current use of insulin ([place])   [facility] Jerrol Banana., MD   7 months ago Type 2 diabetes mellitus without complication, without long-term current use of insulin [facility])   [facility] Jerrol Banana., MD   11 months ago Type 2 diabetes mellitus without complication, without long-term current use of insulin [facility])   [facility] Jerrol Banana., MD   1 year ago Strain of lumbar region, initial encounter   [facility] Jerrol Banana., MD      Future Appointments            In 5 months Jerrol Banana., MD [facility]           . metoprolol succinate (TOPROL-XL) 25 MG 24 hr  tablet [Pharmacy Med Name: METOPROL SUC TAB 25MG  ER] 90 tablet 1    Sig: TAKE 1 TABLET DAILY     Cardiovascular:  Beta Blockers Passed - 12/19/2020  8:14 AM      Passed - Last BP in normal range    BP Readings from Last 1 Encounters:  12/17/20 138/71         Passed - Last Heart Rate in normal range    Pulse Readings from Last 1 Encounters:  12/17/20 64         Passed - Valid encounter within last 6 months    Recent Outpatient Visits          2 days ago Type 2 diabetes mellitus without complication, without long-term current use of insulin [facility])   [facility] Jerrol Banana., MD   3 months ago Type 2 diabetes mellitus without complication, without long-term current use of insulin [facility])   [facility] Jerrol Banana., MD   7 months ago Type 2 diabetes mellitus without complication, without long-term current use of  insulin [facility])   [facility] Jerrol Banana., MD   11 months ago Type 2 diabetes mellitus without complication, without long-term current use of insulin [facility])   [facility] Jerrol Banana., MD   1 year ago Strain of lumbar region, initial encounter   [facility] Jerrol Banana., MD      Future Appointments            In 5 months Jerrol Banana., MD [facility]

## 2021-01-06 DIAGNOSIS — M9903 Segmental and somatic dysfunction of lumbar region: Secondary | ICD-10-CM | POA: Diagnosis not present

## 2021-01-06 DIAGNOSIS — M955 Acquired deformity of pelvis: Secondary | ICD-10-CM | POA: Diagnosis not present

## 2021-01-06 DIAGNOSIS — M9902 Segmental and somatic dysfunction of thoracic region: Secondary | ICD-10-CM | POA: Diagnosis not present

## 2021-01-06 DIAGNOSIS — M546 Pain in thoracic spine: Secondary | ICD-10-CM | POA: Diagnosis not present

## 2021-01-06 DIAGNOSIS — M9905 Segmental and somatic dysfunction of pelvic region: Secondary | ICD-10-CM | POA: Diagnosis not present

## 2021-01-06 DIAGNOSIS — M5441 Lumbago with sciatica, right side: Secondary | ICD-10-CM | POA: Diagnosis not present

## 2021-01-15 ENCOUNTER — Other Ambulatory Visit: Payer: Self-pay | Admitting: Family Medicine

## 2021-01-15 DIAGNOSIS — J302 Other seasonal allergic rhinitis: Secondary | ICD-10-CM

## 2021-01-15 NOTE — Telephone Encounter (Signed)
Requested Prescriptions  Pending Prescriptions Disp Refills  . cetirizine (ZYRTEC) 10 MG tablet [Pharmacy Med Name: CETIRIZINE 10MG  TABLETS] 30 tablet 6    Sig: TAKE 1 TABLET BY MOUTH EVERY DAY     Ear, Nose, and Throat:  Antihistamines Passed - 01/15/2021  3:39 AM      Passed - Valid encounter within last 12 months    Recent Outpatient Visits          4 weeks ago Type 2 diabetes mellitus without complication, without long-term current use of insulin [facility])   [facility] Jerrol Banana., MD   4 months ago Type 2 diabetes mellitus without complication, without long-term current use of insulin [facility])   [facility] Jerrol Banana., MD   8 months ago Type 2 diabetes mellitus without complication, without long-term current use of insulin [facility])   [facility] Jerrol Banana., MD   1 year ago Type 2 diabetes mellitus without complication, without long-term current use of insulin [facility])   [facility] Jerrol Banana., MD   1 year ago Strain of lumbar region, initial encounter   [facility] Jerrol Banana., MD      Future Appointments            In 4 months Jerrol Banana., MD [facility]

## 2021-01-16 ENCOUNTER — Other Ambulatory Visit: Payer: Self-pay | Admitting: Family Medicine

## 2021-01-16 DIAGNOSIS — E119 Type 2 diabetes mellitus without complications: Secondary | ICD-10-CM

## 2021-01-16 NOTE — Telephone Encounter (Signed)
Requested Prescriptions  Pending Prescriptions Disp Refills  . glipiZIDE (GLUCOTROL) 10 MG tablet [Pharmacy Med Name: GLIPIZIDE TAB 10MG ] 90 tablet 1    Sig: TAKE 1 TABLET DAILY     Endocrinology:  Diabetes - Sulfonylureas Passed - 01/16/2021  1:11 AM      Passed - HBA1C is between 0 and 7.9 and within 180 days    Hemoglobin A1C  Date Value Ref Range Status  12/17/2020 6.7 (A) 4.0 - 5.6 % Final   Hgb A1c MFr Bld  Date Value Ref Range Status  09/15/2020 7.4 (H) 4.8 - 5.6 % Final    Comment:             Prediabetes: 5.7 - 6.4          Diabetes: >6.4          Glycemic control for adults with diabetes: <7.0          Passed - Valid encounter within last 6 months    Recent Outpatient Visits          1 month ago Type 2 diabetes mellitus without complication, without long-term current use of insulin [facility])   [facility] Jerrol Banana., MD   4 months ago Type 2 diabetes mellitus without complication, without long-term current use of insulin [facility])   [facility] Jerrol Banana., MD   8 months ago Type 2 diabetes mellitus without complication, without long-term current use of insulin [facility])   [facility] Jerrol Banana., MD   1 year ago Type 2 diabetes mellitus without complication, without long-term current use of insulin [facility])   [facility] Jerrol Banana., MD   1 year ago Strain of lumbar region, initial encounter   [facility] Jerrol Banana., MD      Future Appointments            In 4 months Jerrol Banana., MD [facility]

## 2021-01-26 DIAGNOSIS — H401112 Primary open-angle glaucoma, right eye, moderate stage: Secondary | ICD-10-CM | POA: Diagnosis not present

## 2021-01-26 LAB — HM DIABETES EYE EXAM

## 2021-02-03 DIAGNOSIS — M9905 Segmental and somatic dysfunction of pelvic region: Secondary | ICD-10-CM | POA: Diagnosis not present

## 2021-02-03 DIAGNOSIS — M9902 Segmental and somatic dysfunction of thoracic region: Secondary | ICD-10-CM | POA: Diagnosis not present

## 2021-02-03 DIAGNOSIS — M546 Pain in thoracic spine: Secondary | ICD-10-CM | POA: Diagnosis not present

## 2021-02-03 DIAGNOSIS — M955 Acquired deformity of pelvis: Secondary | ICD-10-CM | POA: Diagnosis not present

## 2021-02-03 DIAGNOSIS — M5441 Lumbago with sciatica, right side: Secondary | ICD-10-CM | POA: Diagnosis not present

## 2021-02-03 DIAGNOSIS — M9903 Segmental and somatic dysfunction of lumbar region: Secondary | ICD-10-CM | POA: Diagnosis not present

## 2021-02-11 ENCOUNTER — Other Ambulatory Visit: Payer: Self-pay | Admitting: Family Medicine

## 2021-02-11 DIAGNOSIS — E119 Type 2 diabetes mellitus without complications: Secondary | ICD-10-CM

## 2021-02-16 ENCOUNTER — Other Ambulatory Visit: Payer: Self-pay

## 2021-02-16 ENCOUNTER — Telehealth: Payer: Self-pay | Admitting: Family Medicine

## 2021-02-16 DIAGNOSIS — J302 Other seasonal allergic rhinitis: Secondary | ICD-10-CM

## 2021-02-16 NOTE — Telephone Encounter (Signed)
[facility] faxed refill request for the following medications:  atorvastatin (LIPITOR) 40 MG tablet   metFORMIN (GLUCOPHAGE-XR) 500 MG 24 hr tablet   glipiZIDE (GLUCOTROL) 10 MG tablet   montelukast (SINGULAIR) 10 MG tablet   [employer] true mextrix test strips   Lancets (FREESTYLE) lancets  cetirizine (ZYRTEC) 10 MG tablet   pioglitazone (ACTOS) 45 MG tablet   metoprolol succinate (TOPROL-XL) 25 MG 24 hr tablet   losartan (COZAAR) 50 MG tablet   Please advise.

## 2021-02-17 ENCOUNTER — Other Ambulatory Visit: Payer: Self-pay

## 2021-02-17 DIAGNOSIS — J301 Allergic rhinitis due to pollen: Secondary | ICD-10-CM

## 2021-02-17 DIAGNOSIS — J302 Other seasonal allergic rhinitis: Secondary | ICD-10-CM

## 2021-02-17 DIAGNOSIS — E119 Type 2 diabetes mellitus without complications: Secondary | ICD-10-CM

## 2021-02-17 DIAGNOSIS — I10 Essential (primary) hypertension: Secondary | ICD-10-CM

## 2021-02-17 MED ORDER — METOPROLOL SUCCINATE ER 25 MG PO TB24
25.0000 mg | ORAL_TABLET | Freq: Every day | ORAL | 1 refills | Status: DC
Start: 2021-02-17 — End: 2021-07-28

## 2021-02-17 MED ORDER — GLIPIZIDE 10 MG PO TABS: 10.0000 mg | ORAL_TABLET | Freq: Every day | ORAL | 1 refills | Status: DC

## 2021-02-17 MED ORDER — METFORMIN HCL ER 500 MG PO TB24: 500.0000 mg | ORAL_TABLET | Freq: Every day | ORAL | 3 refills | Status: DC

## 2021-02-17 MED ORDER — LOSARTAN POTASSIUM 50 MG PO TABS: 50.0000 mg | ORAL_TABLET | Freq: Every day | ORAL | 3 refills | Status: DC

## 2021-02-17 MED ORDER — ATORVASTATIN CALCIUM 40 MG PO TABS: 40.0000 mg | ORAL_TABLET | Freq: Every day | ORAL | 1 refills | Status: DC

## 2021-02-17 MED ORDER — PIOGLITAZONE HCL 45 MG PO TABS: 45.0000 mg | ORAL_TABLET | Freq: Every day | ORAL | 3 refills | Status: DC

## 2021-02-17 MED ORDER — MONTELUKAST SODIUM 10 MG PO TABS: ORAL_TABLET | ORAL | 0 refills | Status: DC

## 2021-02-17 MED ORDER — CETIRIZINE HCL 10 MG PO TABS: 10.0000 mg | ORAL_TABLET | Freq: Every day | ORAL | 1 refills | Status: DC

## 2021-02-19 ENCOUNTER — Telehealth: Payer: Self-pay | Admitting: Family Medicine

## 2021-02-19 NOTE — Telephone Encounter (Signed)
Pt called stating that he is insurance is not covering his Freestyle libre test strips. He states that the pharmacy has asked him to contact PCP to get a PA for this medication. Pt states that he only has 2 test strips left and that his pharmacy is closed on Saturday's. Please advise.

## 2021-02-19 NOTE — Telephone Encounter (Signed)
Per Joseline, PA was completed via covermymeds.   Outcome: Additional Information Required The patient currently has access to the requested medication and a Prior Authorization is not needed for the patient/medication.  I called patient and advised him of this. I then called pharmacy and was advised that they didn't tell patient that the strips need a PA. Pharmacy tech I spoke with says that there is a problem with the pharmacy being able to communicate with patients insurance. The system is down and will not allow them to run the strips to check for coverage. I tried calling patient back, but his wife answered the phone and advised me that patient had already left to go to the pharmacy.

## 2021-02-20 NOTE — Progress Notes (Signed)
Abstraction already completed

## 2021-03-02 ENCOUNTER — Other Ambulatory Visit: Payer: Self-pay | Admitting: Family Medicine

## 2021-03-02 DIAGNOSIS — J301 Allergic rhinitis due to pollen: Secondary | ICD-10-CM

## 2021-03-10 ENCOUNTER — Ambulatory Visit (INDEPENDENT_AMBULATORY_CARE_PROVIDER_SITE_OTHER): Payer: Medicare HMO | Admitting: Family Medicine

## 2021-03-10 ENCOUNTER — Encounter: Payer: Self-pay | Admitting: Family Medicine

## 2021-03-10 ENCOUNTER — Other Ambulatory Visit: Payer: Self-pay

## 2021-03-10 VITALS — BP 129/62 | HR 66 | Resp 16 | Wt 163.0 lb

## 2021-03-10 DIAGNOSIS — M9903 Segmental and somatic dysfunction of lumbar region: Secondary | ICD-10-CM | POA: Diagnosis not present

## 2021-03-10 DIAGNOSIS — E0822 Diabetes mellitus due to underlying condition with diabetic chronic kidney disease: Secondary | ICD-10-CM | POA: Diagnosis not present

## 2021-03-10 DIAGNOSIS — I152 Hypertension secondary to endocrine disorders: Secondary | ICD-10-CM | POA: Insufficient documentation

## 2021-03-10 DIAGNOSIS — N1831 Chronic kidney disease, stage 3a: Secondary | ICD-10-CM

## 2021-03-10 DIAGNOSIS — M9902 Segmental and somatic dysfunction of thoracic region: Secondary | ICD-10-CM | POA: Diagnosis not present

## 2021-03-10 DIAGNOSIS — M545 Low back pain, unspecified: Secondary | ICD-10-CM | POA: Diagnosis not present

## 2021-03-10 DIAGNOSIS — L089 Local infection of the skin and subcutaneous tissue, unspecified: Secondary | ICD-10-CM | POA: Insufficient documentation

## 2021-03-10 DIAGNOSIS — N183 Chronic kidney disease, stage 3 unspecified: Secondary | ICD-10-CM

## 2021-03-10 DIAGNOSIS — M531 Cervicobrachial syndrome: Secondary | ICD-10-CM | POA: Diagnosis not present

## 2021-03-10 DIAGNOSIS — M546 Pain in thoracic spine: Secondary | ICD-10-CM | POA: Diagnosis not present

## 2021-03-10 DIAGNOSIS — Z794 Long term (current) use of insulin: Secondary | ICD-10-CM

## 2021-03-10 DIAGNOSIS — E1159 Type 2 diabetes mellitus with other circulatory complications: Secondary | ICD-10-CM | POA: Insufficient documentation

## 2021-03-10 DIAGNOSIS — M9901 Segmental and somatic dysfunction of cervical region: Secondary | ICD-10-CM | POA: Diagnosis not present

## 2021-03-10 MED ORDER — DOXYCYCLINE HYCLATE 100 MG PO TABS
100.0000 mg | ORAL_TABLET | Freq: Two times a day (BID) | ORAL | 0 refills | Status: DC
Start: 2021-03-10 — End: 2021-05-20

## 2021-03-10 NOTE — Progress Notes (Signed)
Established patient visit   Patient: Carl Dominguez   DOB: Jan 22, 1946   76 y.o. Male  MRN: [id number] Visit Date: 03/10/2021  Today's healthcare provider: Gwyneth Sprout, FNP   Chief Complaint  Patient presents with   Hand Pain    Patient comes in office today with concerns of a possible skin tear/sore on his left index finger that has been present for 5 days or more. Patient does not recall any trauma or past injury to finger.    Subjective    HPI HPI     Hand Pain    Additional comments: Patient comes in office today with concerns of a possible skin tear/sore on his left index finger that has been present for 5 days or more. Patient does not recall any trauma or past injury to finger.       Last edited by Minette Headland, CMA on 03/10/2021  2:17 PM.       Medications: Outpatient Medications Prior to Visit  Medication Sig   acetaminophen (TYLENOL) 500 MG tablet Take 500 mg by mouth 2 (two) times daily.   aspirin 81 MG tablet Take 81 mg by mouth daily.    atorvastatin (LIPITOR) 40 MG tablet Take 1 tablet (40 mg total) by mouth at bedtime.   cetirizine (ZYRTEC) 10 MG tablet Take 1 tablet (10 mg total) by mouth daily.   cholecalciferol (VITAMIN D) 1000 UNITS tablet Take 1,000 Units by mouth daily.    cholestyramine (QUESTRAN) 4 GM/DOSE powder Take one 4 gram scoop daily.  No other medication 4 hours before or after.   cyclobenzaprine (FLEXERIL) 10 MG tablet Take 1 tablet (10 mg total) by mouth daily as needed for muscle spasms.   diazepam (VALIUM) 2 MG tablet Take 1 tablet (2 mg total) by mouth every 8 (eight) hours as needed for anxiety.   famotidine (PEPCID) 20 MG tablet Take 20 mg by mouth daily.    fluticasone (FLONASE) 50 MCG/ACT nasal spray Place 2 sprays into both nostrils daily. (Patient taking differently: Place 2 sprays into both nostrils daily. As needed)   FREESTYLE TEST STRIPS test strip TEST BLOOD SUGAR EVERY DAY AND AS NEEDED   glipiZIDE (GLUCOTROL) 10  MG tablet Take 1 tablet (10 mg total) by mouth daily.   ibuprofen (ADVIL,MOTRIN) 200 MG tablet Take 400 mg by mouth every morning.   Lactobacillus-Inulin (PROBIOTIC DIGESTIVE SUPPORT PO) Take 1 capsule by mouth daily.   Lancets (FREESTYLE) lancets USE AS DIRECTED   latanoprost (XALATAN) 0.005 % ophthalmic solution Place 1 drop into both eyes at bedtime.    losartan (COZAAR) 50 MG tablet Take 1 tablet (50 mg total) by mouth daily.   metFORMIN (GLUCOPHAGE-XR) 500 MG 24 hr tablet Take 1 tablet (500 mg total) by mouth daily with breakfast.   metoprolol succinate (TOPROL-XL) 25 MG 24 hr tablet Take 1 tablet (25 mg total) by mouth daily.   montelukast (SINGULAIR) 10 MG tablet TAKE 1 TABLET(10 MG) BY MOUTH AT BEDTIME   pioglitazone (ACTOS) 45 MG tablet Take 1 tablet (45 mg total) by mouth daily.   psyllium (METAMUCIL SMOOTH TEXTURE) 28 % packet Take 1 packet by mouth 2 (two) times daily.    timolol (TIMOPTIC) 0.5 % ophthalmic solution 1 drop every morning.   vitamin C (ASCORBIC ACID) 500 MG tablet Take 500 mg by mouth daily.    No facility-administered medications prior to visit.    Review of Systems     Objective  BP 129/62    Pulse 66    Resp 16    Wt 163 lb (73.9 kg)    SpO2 98%    BMI 25.53 kg/m    Physical Exam Constitutional:      Appearance: Normal appearance. He is overweight.  Cardiovascular:     Rate and Rhythm: Normal rate.     Pulses: Normal pulses.  Pulmonary:     Effort: Pulmonary effort is normal.  Musculoskeletal:        General: Normal range of motion.  Skin:    General: Skin is warm and dry.     Capillary Refill: Capillary refill takes less than 2 seconds.     Findings: Abscess and erythema present.          Comments: Swelling and redness on L second finger  Neurological:     General: No focal deficit present.     Mental Status: He is alert and oriented to person, place, and time.  Psychiatric:        Mood and Affect: Mood normal.        Behavior: Behavior  normal.        Thought Content: Thought content normal.        Judgment: Judgment normal.      No results found for any visits on 03/10/21.  Assessment & Plan     Problem List Items Addressed This Visit       Cardiovascular and Mediastinum   Hypertension associated with diabetes ([place])    Chronic, stable         Endocrine   Diabetes mellitus due to underlying condition with chronic kidney disease, with long-term current use of insulin (HCC)    Chronic, stable Advised of likelihood of elevated BG readings given slight infection in finger      RESOLVED: Diabetes ([place])     Musculoskeletal and Integument   Skin infection - Primary    Pt reported no point of injury- was out of town at [employer] last weekend; noted that he may have gotten a splinter when picking up twigs at his camp site  Site cleansed; simple puncture to remove pressure and active infection- pus  Site cleansed following removal- wrapped with steri strip; ABX provided  Encouraged to f/u as needed      Relevant Medications   doxycycline (VIBRA-TABS) 100 MG tablet     Return if symptoms worsen or fail to improve.      Vonna Kotyk, FNP, have reviewed all documentation for this visit. The documentation on 03/10/21 for the exam, diagnosis, procedures, and orders are all accurate and complete.    Gwyneth Sprout, [place] [phone] (phone) [phone] (fax)  [place]

## 2021-03-10 NOTE — Assessment & Plan Note (Signed)
Chronic, stable Advised of likelihood of elevated BG readings given slight infection in finger

## 2021-03-10 NOTE — Assessment & Plan Note (Signed)
Chronic, stable 

## 2021-03-10 NOTE — Assessment & Plan Note (Signed)
Pt reported no point of injury- was out of town at [employer] last weekend; noted that he may have gotten a splinter when picking up twigs at his camp site  Site cleansed; simple puncture to remove pressure and active infection- pus  Site cleansed following removal- wrapped with steri strip; ABX provided  Encouraged to f/u as needed

## 2021-04-08 DIAGNOSIS — M9902 Segmental and somatic dysfunction of thoracic region: Secondary | ICD-10-CM | POA: Diagnosis not present

## 2021-04-08 DIAGNOSIS — M531 Cervicobrachial syndrome: Secondary | ICD-10-CM | POA: Diagnosis not present

## 2021-04-08 DIAGNOSIS — M9901 Segmental and somatic dysfunction of cervical region: Secondary | ICD-10-CM | POA: Diagnosis not present

## 2021-04-08 DIAGNOSIS — M546 Pain in thoracic spine: Secondary | ICD-10-CM | POA: Diagnosis not present

## 2021-04-08 DIAGNOSIS — M545 Low back pain, unspecified: Secondary | ICD-10-CM | POA: Diagnosis not present

## 2021-04-08 DIAGNOSIS — M9903 Segmental and somatic dysfunction of lumbar region: Secondary | ICD-10-CM | POA: Diagnosis not present

## 2021-04-09 ENCOUNTER — Ambulatory Visit (INDEPENDENT_AMBULATORY_CARE_PROVIDER_SITE_OTHER): Payer: Medicare HMO

## 2021-04-09 DIAGNOSIS — I519 Heart disease, unspecified: Secondary | ICD-10-CM | POA: Insufficient documentation

## 2021-04-09 DIAGNOSIS — Z0000 Encounter for general adult medical examination without abnormal findings: Secondary | ICD-10-CM | POA: Diagnosis not present

## 2021-04-09 NOTE — Patient Instructions (Addendum)
Carl Dominguez , Thank you for taking time to come for your Medicare Wellness Visit. I appreciate your ongoing commitment to your health goals. Please review the following plan we discussed and let me know if I can assist you in the future.   Screening recommendations/referrals: Colonoscopy: 04/15/17 Recommended yearly ophthalmology/optometry visit for glaucoma screening and checkup Recommended yearly dental visit for hygiene and checkup  Vaccinations: Influenza vaccine: had at local pharmacy Pneumococcal vaccine: 03/14/14 Tdap vaccine: 08/30/18 Shingles vaccine: Shingrix 12/03/16, 04/14/17   Covid-19: 03/07/19, 03/28/19, 01/17/20, 07/23/20  Advanced directives: yes  Conditions/risks identified: none  Next appointment: Follow up in one year for your annual wellness visit. - 04/13/22 @ 1pm by phone  Preventive Care 65 Years and Older, Male Preventive care refers to lifestyle choices and visits with your health care provider that can promote health and wellness. What does preventive care include? A yearly physical exam. This is also called an annual well check. Dental exams once or twice a year. Routine eye exams. Ask your health care provider how often you should have your eyes checked. Personal lifestyle choices, including: Daily care of your teeth and gums. Regular physical activity. Eating a healthy diet. Avoiding tobacco and drug use. Limiting alcohol use. Practicing safe sex. Taking low doses of aspirin every day. Taking vitamin and mineral supplements as recommended by your health care provider. What happens during an annual well check? The services and screenings done by your health care provider during your annual well check will depend on your age, overall health, lifestyle risk factors, and family history of disease. Counseling  Your health care provider may ask you questions about your: Alcohol use. Tobacco use. Drug use. Emotional well-being. Home and relationship  well-being. Sexual activity. Eating habits. History of falls. Memory and ability to understand (cognition). Work and work Statistician. Screening  You may have the following tests or measurements: Height, weight, and BMI. Blood pressure. Lipid and cholesterol levels. These may be checked every 5 years, or more frequently if you are over 67 years old. Skin check. Lung cancer screening. You may have this screening every year starting at age 54 if you have a 30-pack-year history of smoking and currently smoke or have quit within the past 15 years. Fecal occult blood test (FOBT) of the stool. You may have this test every year starting at age 6. Flexible sigmoidoscopy or colonoscopy. You may have a sigmoidoscopy every 5 years or a colonoscopy every 10 years starting at age 31. Prostate cancer screening. Recommendations will vary depending on your family history and other risks. Hepatitis C blood test. Hepatitis B blood test. Sexually transmitted disease (STD) testing. Diabetes screening. This is done by checking your blood sugar (glucose) after you have not eaten for a while (fasting). You may have this done every 1-3 years. Abdominal aortic aneurysm (AAA) screening. You may need this if you are a current or former smoker. Osteoporosis. You may be screened starting at age 42 if you are at high risk. Talk with your health care provider about your test results, treatment options, and if necessary, the need for more tests. Vaccines  Your health care provider may recommend certain vaccines, such as: Influenza vaccine. This is recommended every year. Tetanus, diphtheria, and acellular pertussis (Tdap, Td) vaccine. You may need a Td booster every 10 years. Zoster vaccine. You may need this after age 22. Pneumococcal 13-valent conjugate (PCV13) vaccine. One dose is recommended after age 64. Pneumococcal polysaccharide (PPSV23) vaccine. One dose is recommended after age  26. Talk to your health care  provider about which screenings and vaccines you need and how often you need them. This information is not intended to replace advice given to you by your health care provider. Make sure you discuss any questions you have with your health care provider. Document Released: 03/14/2015 Document Revised: 11/05/2015 Document Reviewed: 12/17/2014 Elsevier Interactive Patient Education  2017 [place] Prevention in the Home Falls can cause injuries. They can happen to people of all ages. There are many things you can do to make your home safe and to help prevent falls. What can I do on the outside of my home? Regularly fix the edges of walkways and driveways and fix any cracks. Remove anything that might make you trip as you walk through a door, such as a raised step or threshold. Trim any bushes or trees on the path to your home. Use bright outdoor lighting. Clear any walking paths of anything that might make someone trip, such as rocks or tools. Regularly check to see if handrails are loose or broken. Make sure that both sides of any steps have handrails. Any raised decks and porches should have guardrails on the edges. Have any leaves, snow, or ice cleared regularly. Use sand or salt on walking paths during winter. Clean up any spills in your garage right away. This includes oil or grease spills. What can I do in the bathroom? Use night lights. Install grab bars by the toilet and in the tub and shower. Do not use towel bars as grab bars. Use non-skid mats or decals in the tub or shower. If you need to sit down in the shower, use a plastic, non-slip stool. Keep the floor dry. Clean up any water that spills on the floor as soon as it happens. Remove soap buildup in the tub or shower regularly. Attach bath mats securely with double-sided non-slip rug tape. Do not have throw rugs and other things on the floor that can make you trip. What can I do in the bedroom? Use night lights. Make  sure that you have a light by your bed that is easy to reach. Do not use any sheets or blankets that are too big for your bed. They should not hang down onto the floor. Have a firm chair that has side arms. You can use this for support while you get dressed. Do not have throw rugs and other things on the floor that can make you trip. What can I do in the kitchen? Clean up any spills right away. Avoid walking on wet floors. Keep items that you use a lot in easy-to-reach places. If you need to reach something above you, use a strong step stool that has a grab bar. Keep electrical cords out of the way. Do not use floor polish or wax that makes floors slippery. If you must use wax, use non-skid floor wax. Do not have throw rugs and other things on the floor that can make you trip. What can I do with my stairs? Do not leave any items on the stairs. Make sure that there are handrails on both sides of the stairs and use them. Fix handrails that are broken or loose. Make sure that handrails are as long as the stairways. Check any carpeting to make sure that it is firmly attached to the stairs. Fix any carpet that is loose or worn. Avoid having throw rugs at the top or bottom of the stairs. If you do have  throw rugs, attach them to the floor with carpet tape. Make sure that you have a light switch at the top of the stairs and the bottom of the stairs. If you do not have them, ask someone to add them for you. What else can I do to help prevent falls? Wear shoes that: Do not have high heels. Have rubber bottoms. Are comfortable and fit you well. Are closed at the toe. Do not wear sandals. If you use a stepladder: Make sure that it is fully opened. Do not climb a closed stepladder. Make sure that both sides of the stepladder are locked into place. Ask someone to hold it for you, if possible. Clearly mark and make sure that you can see: Any grab bars or handrails. First and last steps. Where the  edge of each step is. Use tools that help you move around (mobility aids) if they are needed. These include: Canes. Walkers. Scooters. Crutches. Turn on the lights when you go into a dark area. Replace any light bulbs as soon as they burn out. Set up your furniture so you have a clear path. Avoid moving your furniture around. If any of your floors are uneven, fix them. If there are any pets around you, be aware of where they are. Review your medicines with your doctor. Some medicines can make you feel dizzy. This can increase your chance of falling. Ask your doctor what other things that you can do to help prevent falls. This information is not intended to replace advice given to you by your health care provider. Make sure you discuss any questions you have with your health care provider. Document Released: 12/12/2008 Document Revised: 07/24/2015 Document Reviewed: 03/22/2014 Elsevier Interactive Patient Education  2017 [employer].

## 2021-04-09 NOTE — Progress Notes (Signed)
Virtual Visit via Telephone Note  I connected with  Carl Dominguez on 04/09/21 at  1:00 PM EST by telephone and verified that I am speaking with the correct person using two identifiers.  Location: Patient: home Provider: [facility] Persons participating in the virtual visit: [place]   I discussed the limitations, risks, security and privacy concerns of performing an evaluation and management service by telephone and the availability of in person appointments. The patient expressed understanding and agreed to proceed.  Interactive audio and video telecommunications were attempted between this nurse and patient, however failed, due to patient having technical difficulties OR patient did not have access to video capability.  We continued and completed visit with audio only.  Some vital signs may be absent or patient reported.   Dionisio David, LPN  Subjective:   Carl Dominguez is a 76 y.o. male who presents for Medicare Annual/Subsequent preventive examination.  Review of Systems           Objective:    There were no vitals filed for this visit. There is no height or weight on file to calculate BMI.  Advanced Directives 04/03/2020 04/02/2019 03/22/2018 03/04/2017 03/24/2016  Does Patient Have a Medical Advance Directive? Yes No No Yes Yes  Type of Paramedic of [place];Living will - - Living will Living will  Does patient want to make changes to medical advance directive? - - - - No - Patient declined  Copy of [place] in Chart? No - copy requested - - - -  Would patient like information on creating a medical advance directive? - No - Patient declined No - Patient declined - -    Current Medications (verified) Outpatient Encounter Medications as of 04/09/2021  Medication Sig   acetaminophen (TYLENOL) 500 MG tablet Take 500 mg by mouth 2 (two) times daily.   aspirin 81 MG tablet Take 81 mg by mouth daily.    atorvastatin  (LIPITOR) 40 MG tablet Take 1 tablet (40 mg total) by mouth at bedtime.   cetirizine (ZYRTEC) 10 MG tablet Take 1 tablet (10 mg total) by mouth daily.   cholecalciferol (VITAMIN D) 1000 UNITS tablet Take 1,000 Units by mouth daily.    cholestyramine (QUESTRAN) 4 GM/DOSE powder Take one 4 gram scoop daily.  No other medication 4 hours before or after.   cyclobenzaprine (FLEXERIL) 10 MG tablet Take 1 tablet (10 mg total) by mouth daily as needed for muscle spasms.   diazepam (VALIUM) 2 MG tablet Take 1 tablet (2 mg total) by mouth every 8 (eight) hours as needed for anxiety.   doxycycline (VIBRA-TABS) 100 MG tablet Take 1 tablet (100 mg total) by mouth 2 (two) times daily.   famotidine (PEPCID) 20 MG tablet Take 20 mg by mouth daily.    fluticasone (FLONASE) 50 MCG/ACT nasal spray Place 2 sprays into both nostrils daily. (Patient taking differently: Place 2 sprays into both nostrils daily. As needed)   FREESTYLE TEST STRIPS test strip TEST BLOOD SUGAR EVERY DAY AND AS NEEDED   glipiZIDE (GLUCOTROL) 10 MG tablet Take 1 tablet (10 mg total) by mouth daily.   ibuprofen (ADVIL,MOTRIN) 200 MG tablet Take 400 mg by mouth every morning.   Lactobacillus-Inulin (PROBIOTIC DIGESTIVE SUPPORT PO) Take 1 capsule by mouth daily.   Lancets (FREESTYLE) lancets USE AS DIRECTED   latanoprost (XALATAN) 0.005 % ophthalmic solution Place 1 drop into both eyes at bedtime.    losartan (COZAAR) 50 MG tablet  Take 1 tablet (50 mg total) by mouth daily.   metFORMIN (GLUCOPHAGE-XR) 500 MG 24 hr tablet Take 1 tablet (500 mg total) by mouth daily with breakfast.   metoprolol succinate (TOPROL-XL) 25 MG 24 hr tablet Take 1 tablet (25 mg total) by mouth daily.   montelukast (SINGULAIR) 10 MG tablet TAKE 1 TABLET(10 MG) BY MOUTH AT BEDTIME   pioglitazone (ACTOS) 45 MG tablet Take 1 tablet (45 mg total) by mouth daily.   psyllium (METAMUCIL SMOOTH TEXTURE) 28 % packet Take 1 packet by mouth 2 (two) times daily.    timolol  (TIMOPTIC) 0.5 % ophthalmic solution 1 drop every morning.   vitamin C (ASCORBIC ACID) 500 MG tablet Take 500 mg by mouth daily.    No facility-administered encounter medications on file as of 04/09/2021.    Allergies (verified) Patient has no known allergies.   History: Past Medical History:  Diagnosis Date   Diabetes mellitus without complication ([place])    Heart disease    Hyperlipidemia    Hypertension    Myocardial infarction [facility]) 1999   Past Surgical History:  Procedure Laterality Date   CATARACT EXTRACTION W/PHACO Right 03/24/2016   Procedure: CATARACT EXTRACTION PHACO AND INTRAOCULAR LENS PLACEMENT ([place])  Right;  Surgeon: Leandrew Koyanagi, MD;  Location: [facility];  Service: Ophthalmology;  Laterality: Right;  Diabetic - oral meds right eye   CHOLECYSTECTOMY  2010   CORONARY ANGIOPLASTY WITH STENT PLACEMENT  1999   HERNIA REPAIR     No family history on file. Social History   Socioeconomic History   Marital status: Married    Spouse name: Not on file   Number of children: 2   Years of education: Not on file   Highest education level: Some college, no degree  Occupational History   Occupation: retired  Tobacco Use   Smoking status: Former    Types: Cigarettes    Quit date: 03/01/1982    Years since quitting: 39.1   Smokeless tobacco: Never   Tobacco comments:    has been quit for 30 years  Vaping Use   Vaping Use: Never used  Substance and Sexual Activity   Alcohol use: Yes    Comment: occasionally- 1 beer or margarita   Drug use: No   Sexual activity: Not on file  Other Topics Concern   Not on file  Social History Narrative   Not on file   Social Determinants of Health   Financial Resource Strain: Not on file  Food Insecurity: Not on file  Transportation Needs: Not on file  Physical Activity: Not on file  Stress: Not on file  Social Connections: Not on file    Tobacco Counseling Counseling given: Not Answered Tobacco comments: has  been quit for 30 years   Clinical Intake:  Pre-visit preparation completed: Yes  Pain : No/denies pain     Nutritional Risks: None Diabetes: Yes CBG done?: No Did pt. bring in CBG monitor from home?: No  How often do you need to have someone help you when you read instructions, pamphlets, or other written materials from your doctor or pharmacy?: 1 - Never  Diabetic?yes Nutrition Risk Assessment:  Has the patient had any N/V/D within the last 2 months?  Yes  Does the patient have any non-healing wounds?  No  Has the patient had any unintentional weight loss or weight gain?  No   Diabetes:  Is the patient diabetic?  Yes  If diabetic, was a CBG obtained today?  No  Did the patient bring in their glucometer from home?  No  How often do you monitor your CBG's? Every morning.   Financial Strains and Diabetes Management:  Are you having any financial strains with the device, your supplies or your medication? No .  Does the patient want to be seen by Chronic Care Management for management of their diabetes?  No  Would the patient like to be referred to a Nutritionist or for Diabetic Management?  No   Diabetic Exams:  Diabetic Eye Exam: Completed 01/26/21. Pt has been advised about the importance in completing this exam.   Diabetic Foot Exam: Completed 04/24/20. Pt has been advised about the importance in completing this exam.   Interpreter Needed?: No  Information entered by :: Kirke Shaggy, LPN   Activities of Daily Living In your present state of health, do you have any difficulty performing the following activities: 09/11/2020 04/24/2020  Hearing? Tempie Donning  Vision? N N  Difficulty concentrating or making decisions? Y N  Walking or climbing stairs? N N  Dressing or bathing? N N  Doing errands, shopping? N N  Some recent data might be hidden    Patient Care Team: Jerrol Banana., MD as PCP - General (Family Medicine) Leandrew Koyanagi, MD as Referring  Physician (Ophthalmology) Richmond Campbell, MD as Consulting Physician (Gastroenterology) Yolonda Kida, MD as Consulting Physician (Cardiology)  Indicate any recent Medical Services you may have received from other than [facility] providers in the past year (date may be approximate).     Assessment:   This is a routine wellness examination for [place].  Hearing/Vision screen No results found.  Dietary issues and exercise activities discussed:     Goals Addressed   None    Depression Screen PHQ 2/9 Scores 09/11/2020 04/24/2020 04/03/2020 07/03/2019 04/02/2019 04/02/2019 03/22/2018  PHQ - 2 Score 0 0 0 0 0 0 0  PHQ- 9 Score 0 0 - - - - -    Fall Risk Fall Risk  09/11/2020 04/24/2020 04/03/2020 07/03/2019 04/02/2019  Falls in the past year? 0 0 0 1 1  Number falls in past yr: 0 0 0 0 0  Injury with Fall? 0 0 0 0 0  Risk for fall due to : No Fall Risks - - - -  Follow up Falls evaluation completed Falls evaluation completed - Falls prevention discussed Falls prevention discussed    FALL RISK PREVENTION PERTAINING TO THE HOME:  Any stairs in or around the home? No  If so, are there any without handrails? No  Home free of loose throw rugs in walkways, pet beds, electrical cords, etc? Yes  Adequate lighting in your home to reduce risk of falls? Yes   ASSISTIVE DEVICES UTILIZED TO PREVENT FALLS:  Life alert? No  Use of a cane, walker or w/c? No  Grab bars in the bathroom? No  Shower chair or bench in shower? No  Elevated toilet seat or a handicapped toilet? No    Cognitive Function:Normal cognitive status assessed by direct observation by this Nurse Health Advisor. No abnormalities found.       6CIT Screen 03/22/2018 03/04/2017  What Year? 0 points 0 points  What month? 0 points 0 points  What time? 0 points 0 points  Count back from 20 0 points 0 points  Months in reverse 0 points 0 points  Repeat phrase 0 points 2 points  Total Score 0 2    Immunizations Immunization History   Administered  Date(s) Administered   Fluad Quad(high Dose 65+) 12/04/2020   Influenza, High Dose Seasonal PF 12/01/2015, 12/03/2016, 12/06/2017, 12/12/2019   Influenza-Unspecified 11/30/2014, 12/12/2018   PFIZER Comirnaty(Gray Top)Covid-19 Tri-Sucrose Vaccine 07/23/2020   PFIZER(Purple Top)SARS-COV-2 Vaccination 03/07/2019, 03/28/2019, 01/17/2020   Pneumococcal Conjugate-13 12/07/2012, 03/14/2014   Pneumococcal Polysaccharide-23 02/03/2005, 12/13/2008   Tdap 12/06/2007, 08/30/2018   Zoster Recombinat (Shingrix) 12/03/2016, 04/14/2017   Zoster, Live 12/31/2010    TDAP status: Up to date  Flu Vaccine status: Up to date  Pneumococcal vaccine status: Up to date  Covid-19 vaccine status: Completed vaccines  Qualifies for Shingles Vaccine? Yes   Zostavax completed No   Shingrix Completed?: Yes  Screening Tests Health Maintenance  Topic Date Due   COVID-19 Vaccine (5 - Booster for Pfizer series) 09/17/2020   FOOT EXAM  04/24/2021   HEMOGLOBIN A1C  06/17/2021   OPHTHALMOLOGY EXAM  07/26/2021   COLONOSCOPY (Pts 45-28yrs Insurance coverage will need to be confirmed)  04/04/2022   TETANUS/TDAP  08/29/2028   Pneumonia Vaccine 59+ Years old  Completed   INFLUENZA VACCINE  Completed   Hepatitis C Screening  Completed   Zoster Vaccines- Shingrix  Completed   HPV VACCINES  Aged Out    Health Maintenance  Health Maintenance Due  Topic Date Due   COVID-19 Vaccine (5 - Booster for [place] series) 09/17/2020    Colorectal cancer screening: No longer required.   Lung Cancer Screening: (Low Dose CT Chest recommended if Age 57-80 years, 30 pack-year currently smoking OR have quit w/in 15years.) does not qualify.    Additional Screening:  Hepatitis C Screening: does qualify; Completed 03/10/16  Vision Screening: Recommended annual ophthalmology exams for early detection of glaucoma and other disorders of the eye. Is the patient up to date with their annual eye exam?  Yes  Who is the  provider or what is the name of the office in which the patient attends annual eye exams? [facility] If pt is not established with a provider, would they like to be referred to a provider to establish care? No .   Dental Screening: Recommended annual dental exams for proper oral hygiene  Community Resource Referral / Chronic Care Management: CRR required this visit?  No   CCM required this visit?  No      Plan:     I have personally reviewed and noted the following in the patients chart:   Medical and social history Use of alcohol, tobacco or illicit drugs  Current medications and supplements including opioid prescriptions. Patient is not currently taking opioid prescriptions. Functional ability and status Nutritional status Physical activity Advanced directives List of other physicians Hospitalizations, surgeries, and ER visits in previous 12 months Vitals Screenings to include cognitive, depression, and falls Referrals and appointments  In addition, I have reviewed and discussed with patient certain preventive protocols, quality metrics, and best practice recommendations. A written personalized care plan for preventive services as well as general preventive health recommendations were provided to patient.     Dionisio David, LPN   04/01/6242   Nurse Notes: none

## 2021-04-16 ENCOUNTER — Other Ambulatory Visit: Payer: Self-pay | Admitting: Family Medicine

## 2021-04-16 DIAGNOSIS — J301 Allergic rhinitis due to pollen: Secondary | ICD-10-CM

## 2021-04-16 NOTE — Telephone Encounter (Signed)
Requesting refill too soon.  Requested Prescriptions  Pending Prescriptions Disp Refills   montelukast (SINGULAIR) 10 MG tablet [Pharmacy Med Name: MONTELUKAST SODIUM 10 MG Tablet] 90 tablet 0    Sig: TAKE 1 TABLET AT BEDTIME     Pulmonology:  Leukotriene Inhibitors Passed - 04/16/2021  9:45 AM      Passed - Valid encounter within last 12 months    Recent Outpatient Visits          1 month ago Skin infection   [facility] Tally Joe T, FNP   4 months ago Type 2 diabetes mellitus without complication, without long-term current use of insulin [facility])   [facility] Jerrol Banana., MD   7 months ago Type 2 diabetes mellitus without complication, without long-term current use of insulin [facility])   [facility] Jerrol Banana., MD   11 months ago Type 2 diabetes mellitus without complication, without long-term current use of insulin [facility])   [facility] Jerrol Banana., MD   1 year ago Type 2 diabetes mellitus without complication, without long-term current use of insulin [facility])   [facility] Jerrol Banana., MD      Future Appointments            In 1 month Jerrol Banana., MD [facility]

## 2021-04-16 NOTE — Telephone Encounter (Signed)
Pt requesting refill too soon, filled 03/03/21 for 90 day supply. Requested Prescriptions  Pending Prescriptions Disp Refills   montelukast (SINGULAIR) 10 MG tablet [Pharmacy Med Name: MONTELUKAST SODIUM 10 MG Tablet] 90 tablet 0    Sig: TAKE 1 TABLET AT BEDTIME     Pulmonology:  Leukotriene Inhibitors Passed - 04/16/2021  9:45 AM      Passed - Valid encounter within last 12 months    Recent Outpatient Visits           1 month ago Skin infection   [facility] Tally Joe T, FNP   4 months ago Type 2 diabetes mellitus without complication, without long-term current use of insulin [facility])   [facility] Jerrol Banana., MD   7 months ago Type 2 diabetes mellitus without complication, without long-term current use of insulin [facility])   [facility] Jerrol Banana., MD   11 months ago Type 2 diabetes mellitus without complication, without long-term current use of insulin [facility])   [facility] Jerrol Banana., MD   1 year ago Type 2 diabetes mellitus without complication, without long-term current use of insulin [facility])   [facility] Jerrol Banana., MD       Future Appointments             In 1 month Jerrol Banana., MD [facility]

## 2021-05-06 ENCOUNTER — Telehealth: Payer: Self-pay

## 2021-05-06 DIAGNOSIS — M9901 Segmental and somatic dysfunction of cervical region: Secondary | ICD-10-CM | POA: Diagnosis not present

## 2021-05-06 DIAGNOSIS — M9903 Segmental and somatic dysfunction of lumbar region: Secondary | ICD-10-CM | POA: Diagnosis not present

## 2021-05-06 DIAGNOSIS — M9902 Segmental and somatic dysfunction of thoracic region: Secondary | ICD-10-CM | POA: Diagnosis not present

## 2021-05-06 DIAGNOSIS — M546 Pain in thoracic spine: Secondary | ICD-10-CM | POA: Diagnosis not present

## 2021-05-06 DIAGNOSIS — M531 Cervicobrachial syndrome: Secondary | ICD-10-CM | POA: Diagnosis not present

## 2021-05-06 DIAGNOSIS — M545 Low back pain, unspecified: Secondary | ICD-10-CM | POA: Diagnosis not present

## 2021-05-06 NOTE — Telephone Encounter (Signed)
Copied from [place] [id number]. Topic: General - Other ?>> May 06, 2021  8:42 AM Bayard Beaver wrote: ?Reason for CRM: kpt called in states [facility] will be faxing request for glucose strips and monitor ?

## 2021-05-18 ENCOUNTER — Other Ambulatory Visit: Payer: Self-pay | Admitting: Family Medicine

## 2021-05-18 DIAGNOSIS — J301 Allergic rhinitis due to pollen: Secondary | ICD-10-CM

## 2021-05-19 NOTE — Progress Notes (Signed)
?  ? ? ?Established patient visit ? ?I,[name],acting as a scribe for [name], MD.,have documented all relevant documentation on the behalf of [name], MD,as directed by  [name], MD while in the presence of [name], MD. ? ? ?Patient: Carl Dominguez   DOB: 12-Nov-1945   76 y.o. Male  MRN: [id number] ?Visit Date: 05/20/2021 ? ?Today's healthcare provider: [name], MD  ? ?Chief Complaint  ?Patient presents with  ? Follow-up  ? Diabetes  ? Hypertension  ? ?Subjective  ?  ?HPI  ?Patient comes in today for follow-up.  He is feeling well.  He now has 8 grandchildren.  He has no complaints today.  He is tolerating his medication well. ?No chest pain or any cardiac symptoms. ?He has no hypoglycemia ?Diabetes Mellitus Type II, follow-up ? ?Lab Results  ?Component Value Date  ? HGBA1C 6.7 (A) 12/17/2020  ? HGBA1C 7.4 (H) 09/15/2020  ? HGBA1C 7.5 (H) 04/24/2020  ? ?Last seen for diabetes 5 months ago.  ?Management since then includes; No hypoglycemia but would be quick to take away or decrease glipizide.  Patient does not wish to stop anything presently. ?He reports good compliance with treatment. ?He is not having side effects. none ? ?Home blood sugar records: fasting range: 153-164 ? ?Episodes of hypoglycemia? No none ?  ?Current insulin regiment: n/a ?Most Recent Eye Exam: 01/26/2021 ? ?--------------------------------------------------------------------------------------------------- ?Hypertension, follow-up ? ?BP Readings from Last 3 Encounters:  ?05/20/21 137/67  ?03/10/21 129/62  ?12/17/20 138/71  ? Wt Readings from Last 3 Encounters:  ?05/20/21 160 lb (72.6 kg)  ?03/10/21 163 lb (73.9 kg)  ?12/17/20 161 lb (73 kg)  ?  ? ?He was last seen for hypertension 5 months ago.  ?BP at that visit was 138/71. ?Management since that visit includes; Well-controlled on losartan metoprolol. Labs checked 09/15/2020 showing-stable.  Plan to repeat CBC also on next visit.  ?He  reports good compliance with treatment. ?He is not having side effects. none ?He is exercising. ?He is adherent to low salt diet.   ?Outside blood pressures are not checking. ? ?He does not smoke. ? ?Use of agents associated with hypertension: none.  ? ?--------------------------------------------------------------------------------------------------- ? ? ?Medications: ?Outpatient Medications Prior to Visit  ?Medication Sig  ? acetaminophen (TYLENOL) 500 MG tablet Take 500 mg by mouth 2 (two) times daily.  ? aspirin 81 MG tablet Take 81 mg by mouth daily.   ? atorvastatin (LIPITOR) 40 MG tablet Take 1 tablet (40 mg total) by mouth at bedtime.  ? cetirizine (ZYRTEC) 10 MG tablet Take 1 tablet (10 mg total) by mouth daily.  ? cholecalciferol (VITAMIN D) 1000 UNITS tablet Take 1,000 Units by mouth daily.   ? cholestyramine (QUESTRAN) 4 GM/DOSE powder Take one 4 gram scoop daily.  No other medication 4 hours before or after.  ? famotidine (PEPCID) 20 MG tablet Take 20 mg by mouth daily.   ? FREESTYLE TEST STRIPS test strip TEST BLOOD SUGAR EVERY DAY AND AS NEEDED  ? glipiZIDE (GLUCOTROL) 10 MG tablet Take 1 tablet (10 mg total) by mouth daily.  ? Lancets (FREESTYLE) lancets USE AS DIRECTED  ? latanoprost (XALATAN) 0.005 % ophthalmic solution Place 1 drop into both eyes at bedtime.   ? losartan (COZAAR) 50 MG tablet Take 1 tablet (50 mg total) by mouth daily.  ? metFORMIN (GLUCOPHAGE-XR) 500 MG 24 hr tablet Take 1 tablet (500 mg total) by mouth daily with breakfast.  ? metoprolol  succinate (TOPROL-XL) 25 MG 24 hr tablet Take 1 tablet (25 mg total) by mouth daily.  ? montelukast (SINGULAIR) 10 MG tablet TAKE 1 TABLET AT BEDTIME  ? pioglitazone (ACTOS) 45 MG tablet Take 1 tablet (45 mg total) by mouth daily.  ? psyllium (METAMUCIL SMOOTH TEXTURE) 28 % packet Take 1 packet by mouth 2 (two) times daily.   ? timolol (TIMOPTIC) 0.5 % ophthalmic solution 1 drop every morning.  ? vitamin C (ASCORBIC ACID) 500 MG tablet Take 500  mg by mouth daily.   ? [DISCONTINUED] cyclobenzaprine (FLEXERIL) 10 MG tablet Take 1 tablet (10 mg total) by mouth daily as needed for muscle spasms. (Patient not taking: Reported on 04/09/2021)  ? [DISCONTINUED] diazepam (VALIUM) 2 MG tablet Take 1 tablet (2 mg total) by mouth every 8 (eight) hours as needed for anxiety. (Patient not taking: Reported on 04/09/2021)  ? [DISCONTINUED] doxycycline (VIBRA-TABS) 100 MG tablet Take 1 tablet (100 mg total) by mouth 2 (two) times daily. (Patient not taking: Reported on 04/09/2021)  ? [DISCONTINUED] fluticasone (FLONASE) 50 MCG/ACT nasal spray Place 2 sprays into both nostrils daily. (Patient not taking: Reported on 04/09/2021)  ? [DISCONTINUED] ibuprofen (ADVIL,MOTRIN) 200 MG tablet Take 400 mg by mouth every morning. (Patient not taking: Reported on 04/09/2021)  ? [DISCONTINUED] Lactobacillus-Inulin (PROBIOTIC DIGESTIVE SUPPORT PO) Take 1 capsule by mouth daily. (Patient not taking: Reported on 05/20/2021)  ? ?No facility-administered medications prior to visit.  ? ? ?Review of Systems  ?Constitutional:  Negative for appetite change, chills and fever.  ?Respiratory:  Negative for chest tightness, shortness of breath and wheezing.   ?Cardiovascular:  Negative for chest pain and palpitations.  ?Gastrointestinal:  Negative for abdominal pain, nausea and vomiting.  ? ?Last hemoglobin A1c ?Lab Results  ?Component Value Date  ? HGBA1C 7.1 (H) 05/20/2021  ? ?  ?  Objective  ?  ?BP 137/67 (BP Location: Left Arm, Patient Position: Sitting, Cuff Size: Normal)   Pulse (!) 59   Temp 98.4 ?F (36.9 ?C) (Temporal)   Resp 16   Ht $R'5\' 7"'SB$  (1.702 m)   Wt 160 lb (72.6 kg)   SpO2 97%   BMI 25.06 kg/m?  ?SpO2 Readings from Last 3 Encounters:  ?05/20/21 97%  ?03/10/21 98%  ?12/17/20 97%  ? ?  ? ?Physical Exam ?Vitals reviewed.  ?Constitutional:   ?   Appearance: Normal appearance. He is well-developed.  ?HENT:  ?   Head: Normocephalic and atraumatic.  ?   Right Ear: External ear normal.  ?   Left  Ear: External ear normal.  ?   Nose: Nose normal.  ?Eyes:  ?   General: No scleral icterus. ?   Conjunctiva/sclera: Conjunctivae normal.  ?Neck:  ?   Thyroid: No thyromegaly.  ?   Vascular: No carotid bruit.  ?Cardiovascular:  ?   Rate and Rhythm: Normal rate and regular rhythm.  ?   Pulses: Normal pulses.  ?   Heart sounds: Normal heart sounds.  ?Pulmonary:  ?   Effort: Pulmonary effort is normal.  ?   Breath sounds: Normal breath sounds.  ?Abdominal:  ?   Palpations: Abdomen is soft.  ?Musculoskeletal:  ?   Cervical back: Neck supple.  ?   Right lower leg: No edema.  ?   Left lower leg: No edema.  ?Lymphadenopathy:  ?   Cervical: No cervical adenopathy.  ?Skin: ?   General: Skin is warm and dry.  ?Neurological:  ?   General: No focal deficit  present.  ?   Mental Status: He is alert and oriented to person, place, and time.  ?   Comments: Monofilament foot exam is normal.  ?Psychiatric:     ?   Mood and Affect: Mood normal.     ?   Behavior: Behavior normal.     ?   Thought Content: Thought content normal.     ?   Judgment: Judgment normal.  ?  ? ? ?No results found for any visits on 05/20/21. ? Assessment & Plan  ?  ? ?1. Diabetes mellitus due to underlying condition with stage 3a chronic kidney disease, with long-term current use of insulin ([place]) ?Goal A1c less than 7.5-8 continue metformin, may go up on the dose continue pioglitazone, consider stopping glipizide in the future ?- CBC w/Diff/Platelet ?- Hemoglobin A1c ?- blood glucose meter kit and supplies; Dispense based on patient and insurance preference. Use once daily as directed. (FOR ICD-10 E08.22, N18.31, Z78.4).  Dispense: 1 each; Refill: 0 ? ?2. CAD S/P percutaneous coronary angioplasty ?All risk factors treated ? ?3. CAD in native artery ? ? ?4. Essential (primary) hypertension ?Good control on losartan and metoprolol ?- CBC w/Diff/Platelet ? ?5. Hypercholesterolemia without hypertriglyceridemia ?On atorvastatin 40 ? ?6. Gastro-esophageal reflux disease  without esophagitis ?On Pepcid as needed ? ? ?No follow-ups on file.  ?   ? ?I, [name], MD, have reviewed all documentation for this visit. The documentation on 05/21/21 for the exam, diagnosis, pr

## 2021-05-20 ENCOUNTER — Encounter: Payer: Self-pay | Admitting: Family Medicine

## 2021-05-20 ENCOUNTER — Other Ambulatory Visit: Payer: Self-pay

## 2021-05-20 ENCOUNTER — Ambulatory Visit (INDEPENDENT_AMBULATORY_CARE_PROVIDER_SITE_OTHER): Payer: Medicare HMO | Admitting: Family Medicine

## 2021-05-20 VITALS — BP 137/67 | HR 59 | Temp 98.4°F | Resp 16 | Ht 67.0 in | Wt 160.0 lb

## 2021-05-20 DIAGNOSIS — E0822 Diabetes mellitus due to underlying condition with diabetic chronic kidney disease: Secondary | ICD-10-CM

## 2021-05-20 DIAGNOSIS — I2510 Atherosclerotic heart disease of native coronary artery without angina pectoris: Secondary | ICD-10-CM

## 2021-05-20 DIAGNOSIS — N1831 Chronic kidney disease, stage 3a: Secondary | ICD-10-CM | POA: Diagnosis not present

## 2021-05-20 DIAGNOSIS — I10 Essential (primary) hypertension: Secondary | ICD-10-CM | POA: Diagnosis not present

## 2021-05-20 DIAGNOSIS — Z9861 Coronary angioplasty status: Secondary | ICD-10-CM

## 2021-05-20 DIAGNOSIS — E7800 Pure hypercholesterolemia, unspecified: Secondary | ICD-10-CM

## 2021-05-20 DIAGNOSIS — Z794 Long term (current) use of insulin: Secondary | ICD-10-CM | POA: Diagnosis not present

## 2021-05-20 DIAGNOSIS — K219 Gastro-esophageal reflux disease without esophagitis: Secondary | ICD-10-CM | POA: Diagnosis not present

## 2021-05-20 MED ORDER — BLOOD GLUCOSE METER KIT: PACK | 0 refills | Status: DC

## 2021-05-21 LAB — CBC WITH DIFFERENTIAL/PLATELET
Basophils Absolute: 0 10*3/uL (ref 0.0–0.2)
Basos: 0 %
EOS (ABSOLUTE): 0.2 10*3/uL (ref 0.0–0.4)
Eos: 3 %
Hematocrit: 41.4 % (ref 37.5–51.0)
Hemoglobin: 13.8 g/dL (ref 13.0–17.7)
Immature Grans (Abs): 0 10*3/uL (ref 0.0–0.1)
Immature Granulocytes: 0 %
Lymphocytes Absolute: 1.6 10*3/uL (ref 0.7–3.1)
Lymphs: 20 %
MCH: 31.4 pg (ref 26.6–33.0)
MCHC: 33.3 g/dL (ref 31.5–35.7)
MCV: 94 fL (ref 79–97)
Monocytes Absolute: 0.7 10*3/uL (ref 0.1–0.9)
Monocytes: 8 %
Neutrophils Absolute: 5.7 10*3/uL (ref 1.4–7.0)
Neutrophils: 69 %
Platelets: 160 10*3/uL (ref 150–450)
RBC: 4.39 x10E6/uL (ref 4.14–5.80)
RDW: 12.7 % (ref 11.6–15.4)
WBC: 8.3 10*3/uL (ref 3.4–10.8)

## 2021-05-21 LAB — HEMOGLOBIN A1C
Est. average glucose Bld gHb Est-mCnc: 157 mg/dL
Hgb A1c MFr Bld: 7.1 % — ABNORMAL HIGH (ref 4.8–5.6)

## 2021-05-27 DIAGNOSIS — E119 Type 2 diabetes mellitus without complications: Secondary | ICD-10-CM | POA: Diagnosis not present

## 2021-05-27 DIAGNOSIS — I2510 Atherosclerotic heart disease of native coronary artery without angina pectoris: Secondary | ICD-10-CM | POA: Diagnosis not present

## 2021-05-27 DIAGNOSIS — Z955 Presence of coronary angioplasty implant and graft: Secondary | ICD-10-CM | POA: Diagnosis not present

## 2021-05-27 DIAGNOSIS — I10 Essential (primary) hypertension: Secondary | ICD-10-CM | POA: Diagnosis not present

## 2021-06-03 DIAGNOSIS — M546 Pain in thoracic spine: Secondary | ICD-10-CM | POA: Diagnosis not present

## 2021-06-03 DIAGNOSIS — M545 Low back pain, unspecified: Secondary | ICD-10-CM | POA: Diagnosis not present

## 2021-06-03 DIAGNOSIS — M531 Cervicobrachial syndrome: Secondary | ICD-10-CM | POA: Diagnosis not present

## 2021-06-03 DIAGNOSIS — M9902 Segmental and somatic dysfunction of thoracic region: Secondary | ICD-10-CM | POA: Diagnosis not present

## 2021-06-03 DIAGNOSIS — M9901 Segmental and somatic dysfunction of cervical region: Secondary | ICD-10-CM | POA: Diagnosis not present

## 2021-06-03 DIAGNOSIS — M9903 Segmental and somatic dysfunction of lumbar region: Secondary | ICD-10-CM | POA: Diagnosis not present

## 2021-06-22 ENCOUNTER — Ambulatory Visit: Payer: Self-pay

## 2021-06-22 NOTE — Telephone Encounter (Signed)
?  Chief Complaint: hyperglycemia ?Symptoms: BS 245, been elevated 1-2 weeks ?Frequency: 1-2 weeks ?Pertinent Negatives: Patient denies S&S of hyperglycemia ?Disposition: '[]'$ ED /'[]'$ Urgent Care (no appt availability in office) / '[x]'$ Appointment(In office/virtual)/ '[]'$  [place] [facility]/ '[]'$ Home Care/ '[]'$ Refused Recommended Disposition /'[]'$ [place] [facility]/ '[]'$  Follow-up with PCP ?Additional Notes: pt would like to come in for OV d/t BS being elevated. Book It would not allow me to make appt for several "Same Day" appts that were available so I advised pt I would have someone call in first thing in the morning to work him in for an appt. Pt verbalized understanding.  ? ?Reason for Disposition ? [1] Caller has NON-URGENT medication or insulin pump question AND [2] triager unable to answer question ? ?Answer Assessment - Initial Assessment Questions ?1. BLOOD GLUCOSE: "What is your blood glucose level?"  ?    245 ?2. ONSET: "When did you check the blood glucose?" ?    This morning ?3. USUAL RANGE: "What is your glucose level usually?" (e.g., usual fasting morning value, usual evening value) ?    138-155 ?5. TYPE 1 or 2:  "Do you know what type of diabetes you have?"  (e.g., Type 1, Type 2, Gestational; doesn't know)  ?    DM2 ?6. INSULIN: "Do you take insulin?" "What type of insulin(s) do you use? What is the mode of delivery? (syringe, pen; injection or pump)?"  ?    No ?7. DIABETES PILLS: "Do you take any pills for your diabetes?" If yes, ask: "Have you missed taking any pills recently?" ?    Yes, and not missed  ?8. OTHER SYMPTOMS: "Do you have any symptoms?" (e.g., fever, frequent urination, difficulty breathing, dizziness, weakness, vomiting) ?    No ? ?Protocols used: Diabetes - High Blood Sugar-A-AH ? ?

## 2021-06-22 NOTE — Telephone Encounter (Signed)
Patient called, left VM to return the call to the office to discuss symptoms with a nurse. ? ?Summary: Elevated Blood sugar, no symptoms  ? Pt called reporting that his blood sugar has "sky rocketed" - says this morning was 245, says he did not eat much yesterday and does not know why his reading is so high. No symptoms reported  ? ? ?Best contact: [phone]   ?  ? ?

## 2021-06-23 ENCOUNTER — Encounter: Payer: Self-pay | Admitting: Family Medicine

## 2021-06-23 ENCOUNTER — Ambulatory Visit (INDEPENDENT_AMBULATORY_CARE_PROVIDER_SITE_OTHER): Payer: Medicare HMO | Admitting: Family Medicine

## 2021-06-23 DIAGNOSIS — E119 Type 2 diabetes mellitus without complications: Secondary | ICD-10-CM

## 2021-06-23 LAB — GLUCOSE, POCT (MANUAL RESULT ENTRY): POC Glucose: 181 mg/dl — AB (ref 70–99)

## 2021-06-23 MED ORDER — METFORMIN HCL 500 MG PO TABS: 500.0000 mg | ORAL_TABLET | Freq: Two times a day (BID) | ORAL | 3 refills | Status: DC

## 2021-06-23 NOTE — Progress Notes (Signed)
? ?I,Elena D DeSanto,acting as a scribe for [name], MD.,have documented all relevant documentation on the behalf of [name], MD,as directed by  [name], MD while in the presence of [name], MD. ?  ? ? ?Established patient visit ? ? ?Patient: Carl Dominguez   DOB: 08/07/1945   76 y.o. Male  MRN: [id number] ?Visit Date: 06/23/2021 ? ?Today's healthcare provider: [name], MD  ? ?No chief complaint on file. ? ?Subjective  ?  ?HPI  ? ?Diabetes Mellitus Type II, Follow-up ? ?Lab Results  ?Component Value Date  ? HGBA1C 7.1 (H) 05/20/2021  ? HGBA1C 6.7 (A) 12/17/2020  ? HGBA1C 7.4 (H) 09/15/2020  ? ?Wt Readings from Last 3 Encounters:  ?06/23/21 155 lb (70.3 kg)  ?05/20/21 160 lb (72.6 kg)  ?03/10/21 163 lb (73.9 kg)  ? ?Patient is a 76 year old male who presents today for evaluation of elevated home glucose readings.  He reports that for the last 1-2 weeks he has been having reading in the 250 range.  He denies changes in his diet, ,medication or activities. ? ?Symptoms: ?No fatigue No foot ulcerations  ?No appetite changes No nausea  ?No paresthesia of the feet  No polydipsia  ?No polyuria No visual disturbances   ?No vomiting   ? ? ?Pertinent Labs: ?Lab Results  ?Component Value Date  ? CHOL 126 09/15/2020  ? HDL 50 09/15/2020  ? LDLCALC 53 09/15/2020  ? TRIG 134 09/15/2020  ? CHOLHDL 2.5 09/15/2020  ? Lab Results  ?Component Value Date  ? NA 144 09/15/2020  ? K 4.6 09/15/2020  ? CREATININE 1.27 09/15/2020  ? EGFR 58 (L) 09/15/2020  ? MICROALBUR 20 11/23/2017  ?  ? ?--------------------------------------------------------------------------------------------------- ? ? ?Medications: ?Outpatient Medications Prior to Visit  ?Medication Sig  ? acetaminophen (TYLENOL) 500 MG tablet Take 500 mg by mouth 2 (two) times daily.  ? aspirin 81 MG tablet Take 81 mg by mouth daily.   ? atorvastatin (LIPITOR) 40 MG tablet Take 1 tablet (40 mg total) by mouth at bedtime.  ?  cetirizine (ZYRTEC) 10 MG tablet Take 1 tablet (10 mg total) by mouth daily.  ? cholecalciferol (VITAMIN D) 1000 UNITS tablet Take 1,000 Units by mouth daily.   ? cholestyramine (QUESTRAN) 4 GM/DOSE powder Take one 4 gram scoop daily.  No other medication 4 hours before or after.  ? famotidine (PEPCID) 20 MG tablet Take 20 mg by mouth daily.   ? glipiZIDE (GLUCOTROL) 10 MG tablet Take 1 tablet (10 mg total) by mouth daily.  ? latanoprost (XALATAN) 0.005 % ophthalmic solution Place 1 drop into both eyes at bedtime.   ? losartan (COZAAR) 50 MG tablet Take 1 tablet (50 mg total) by mouth daily.  ? metFORMIN (GLUCOPHAGE-XR) 500 MG 24 hr tablet Take 1 tablet (500 mg total) by mouth daily with breakfast.  ? metoprolol succinate (TOPROL-XL) 25 MG 24 hr tablet Take 1 tablet (25 mg total) by mouth daily.  ? montelukast (SINGULAIR) 10 MG tablet TAKE 1 TABLET AT BEDTIME  ? pioglitazone (ACTOS) 45 MG tablet Take 1 tablet (45 mg total) by mouth daily.  ? psyllium (METAMUCIL SMOOTH TEXTURE) 28 % packet Take 1 packet by mouth 2 (two) times daily.   ? timolol (TIMOPTIC) 0.5 % ophthalmic solution 1 drop every morning.  ? [DISCONTINUED] blood glucose meter kit and supplies Dispense based on patient and insurance preference. Use once daily as directed. (FOR ICD-10 E08.22, N18.31, Z78.4).  ? [  DISCONTINUED] FREESTYLE TEST STRIPS test strip TEST BLOOD SUGAR EVERY DAY AND AS NEEDED  ? [DISCONTINUED] Lancets (FREESTYLE) lancets USE AS DIRECTED  ? [DISCONTINUED] vitamin C (ASCORBIC ACID) 500 MG tablet Take 500 mg by mouth daily.   ? ?No facility-administered medications prior to visit.  ? ? ?Review of Systems ? ?  ?  Objective  ?  ?BP (!) 168/89 (BP Location: Right Arm, Patient Position: Sitting, Cuff Size: Normal)   Pulse 65   Temp (!) 97.4 ?F (36.3 ?C) (Oral)   Wt 155 lb (70.3 kg)   SpO2 99%   BMI 24.28 kg/m?  ?  ? ?Physical Exam ?Vitals reviewed.  ?Constitutional:   ?   Appearance: Normal appearance. He is well-developed.  ?HENT:  ?    Head: Normocephalic and atraumatic.  ?   Right Ear: External ear normal.  ?   Left Ear: External ear normal.  ?   Nose: Nose normal.  ?Eyes:  ?   General: No scleral icterus. ?   Conjunctiva/sclera: Conjunctivae normal.  ?Neck:  ?   Thyroid: No thyromegaly.  ?   Vascular: No carotid bruit.  ?Cardiovascular:  ?   Rate and Rhythm: Normal rate and regular rhythm.  ?   Pulses: Normal pulses.  ?   Heart sounds: Normal heart sounds.  ?Pulmonary:  ?   Effort: Pulmonary effort is normal.  ?   Breath sounds: Normal breath sounds.  ?Abdominal:  ?   Palpations: Abdomen is soft.  ?Musculoskeletal:  ?   Cervical back: Neck supple.  ?   Right lower leg: No edema.  ?   Left lower leg: No edema.  ?Lymphadenopathy:  ?   Cervical: No cervical adenopathy.  ?Skin: ?   General: Skin is warm and dry.  ?Neurological:  ?   General: No focal deficit present.  ?   Mental Status: He is alert and oriented to person, place, and time.  ?   Comments: Monofilament foot exam is normal.  ?Psychiatric:     ?   Mood and Affect: Mood normal.     ?   Behavior: Behavior normal.     ?   Thought Content: Thought content normal.     ?   Judgment: Judgment normal.  ?  ? ? ?No results found for any visits on 06/23/21. ? Assessment & Plan  ?  ? ?1. Type 2 diabetes mellitus without complication, without long-term current use of insulin ([name]) ?We will increase metformin 500 mg daily to twice daily.  Patient to watch for hypoglycemia as he is on glipizide.  Follow-up with A1c on next visit. ?- POCT Glucose (CBG) ?Office machine 181, patient machine reading 195 ? ?No follow-ups on file.  ?   ? ?I, [name], MD, have reviewed all documentation for this visit. The documentation on 06/27/21 for the exam, diagnosis, procedures, and orders are all accurate and complete. ? ? ? ?Levis Nazir Cranford Mon, MD  ?[facility] ?[phone] (phone) ?[phone] (fax) ? ?[facility] ?

## 2021-06-25 DIAGNOSIS — D1801 Hemangioma of skin and subcutaneous tissue: Secondary | ICD-10-CM | POA: Diagnosis not present

## 2021-06-25 DIAGNOSIS — L814 Other melanin hyperpigmentation: Secondary | ICD-10-CM | POA: Diagnosis not present

## 2021-06-25 DIAGNOSIS — L570 Actinic keratosis: Secondary | ICD-10-CM | POA: Diagnosis not present

## 2021-06-25 DIAGNOSIS — L568 Other specified acute skin changes due to ultraviolet radiation: Secondary | ICD-10-CM | POA: Diagnosis not present

## 2021-06-25 DIAGNOSIS — D692 Other nonthrombocytopenic purpura: Secondary | ICD-10-CM | POA: Diagnosis not present

## 2021-06-26 ENCOUNTER — Telehealth: Payer: Self-pay

## 2021-06-26 NOTE — Telephone Encounter (Signed)
Copied from [place] [id number]. Topic: General - Call Back - No Documentation ?>> Jun 26, 2021 11:03 AM Jodie Echevaria wrote: ?Reason for CRM: Patient called in stated that he had a missed call from 06/25/21 and was returning the call nothing noted please advise and call Ph# [phone] ?

## 2021-07-27 ENCOUNTER — Other Ambulatory Visit: Payer: Self-pay | Admitting: Family Medicine

## 2021-07-27 DIAGNOSIS — I10 Essential (primary) hypertension: Secondary | ICD-10-CM

## 2021-07-27 DIAGNOSIS — E119 Type 2 diabetes mellitus without complications: Secondary | ICD-10-CM

## 2021-07-28 ENCOUNTER — Other Ambulatory Visit: Payer: Self-pay

## 2021-07-28 MED ORDER — TRUE METRIX LEVEL 1 LOW VI SOLN: 1.0000 | Freq: Every day | 5 refills | Status: DC

## 2021-07-29 DIAGNOSIS — M9902 Segmental and somatic dysfunction of thoracic region: Secondary | ICD-10-CM | POA: Diagnosis not present

## 2021-07-29 DIAGNOSIS — M9901 Segmental and somatic dysfunction of cervical region: Secondary | ICD-10-CM | POA: Diagnosis not present

## 2021-07-29 DIAGNOSIS — M531 Cervicobrachial syndrome: Secondary | ICD-10-CM | POA: Diagnosis not present

## 2021-07-29 DIAGNOSIS — M545 Low back pain, unspecified: Secondary | ICD-10-CM | POA: Diagnosis not present

## 2021-07-29 DIAGNOSIS — M546 Pain in thoracic spine: Secondary | ICD-10-CM | POA: Diagnosis not present

## 2021-07-29 DIAGNOSIS — H401112 Primary open-angle glaucoma, right eye, moderate stage: Secondary | ICD-10-CM | POA: Diagnosis not present

## 2021-07-29 DIAGNOSIS — M9903 Segmental and somatic dysfunction of lumbar region: Secondary | ICD-10-CM | POA: Diagnosis not present

## 2021-07-30 ENCOUNTER — Other Ambulatory Visit: Payer: Self-pay | Admitting: Family Medicine

## 2021-08-10 ENCOUNTER — Ambulatory Visit: Payer: Self-pay | Admitting: *Deleted

## 2021-08-10 NOTE — Telephone Encounter (Signed)
  Chief Complaint: rectal pain Symptoms: pain Frequency: constant esp when urinates Pertinent Negatives: Patient denies fever Disposition: '[]'$ ED /'[]'$ Urgent Care (no appt availability in office) / '[x]'$ Appointment(In office/virtual)/ '[]'$  [place] [facility]/ '[]'$ Home Care/ '[]'$ Refused Recommended Disposition /'[]'$ [place] [facility]/ '[]'$  Follow-up with PCP Additional Notes: Pt believes Metformin is causing rectal pain and hemorrhoid to worsen. He increased to twice a day and it worsened. He is not taking a second dose today, appt tomorrow with Dr. Rosanna Randy.  Reason for Disposition  MODERATE-SEVERE rectal pain (i.e., interferes with school, work, or sleep)  Answer Assessment - Initial Assessment Questions 1. SYMPTOM:  "What's the main symptom you're concerned about?" (e.g., pain, itching, swelling, rash)     Rectal pain 2. ONSET: "When did the pain  start?"     When started Metformin, increased when increased dose 3. RECTAL PAIN: "Do you have any pain around your rectum?" "How bad is the pain?"  (Scale 0-10; or mild, moderate, severe)   - NONE (0): no pain   - MILD (1-3): doesn't interfere with normal activities    - MODERATE (4-7): interferes with normal activities or awakens from sleep, limping    - SEVERE (8-10): excruciating pain, unable to have a bowel movement      bad 4. RECTAL ITCHING: "Do you have any itching in this area?" "How bad is the itching?"  (Scale 0-10; or mild, moderate, severe)   - NONE: no itching   - MILD: doesn't interfere with normal activities    - MODERATE-SEVERE: interferes with normal activities or awakens from sleep     no 5. CONSTIPATION: "Do you have constipation?" If Yes, ask: "How bad is it?"     No, everytime he urinates it feels like he is going to also have a bm. 6. CAUSE: "What do you think is causing the anus symptoms?"     Metformin 7. OTHER SYMPTOMS: "Do you have any other symptoms?"  (e.g., abdomen pain, fever, rectal bleeding, vomiting)     no 8.  PREGNANCY: "Is there any chance you are pregnant?" "When was your last menstrual period?"     na  Protocols used: Rectal Symptoms-A-AH

## 2021-08-10 NOTE — Telephone Encounter (Signed)
Pt stated his medication was changed to metformin Pt stated its not working and messed up his stomach.   Pt mentioned he is taking 2 doses. Pt stated he was going to stop taking the second dose.   BS was 240 this morning. Pt stated went to walk on a road by his house for about 3 miles. Bs was 204; generally, it drops when he does this.   Pt stated that he is experiencing anus pain (hemorrhoid) and feels like he is going to poop when urinating. Pt denied abdominal pain.   Pt seeking clinical advice.   Attempted to reach pt, wife answered, states pt currently at Y "On treadmill." Advised to CB.

## 2021-08-11 ENCOUNTER — Ambulatory Visit (INDEPENDENT_AMBULATORY_CARE_PROVIDER_SITE_OTHER): Payer: Medicare HMO | Admitting: Family Medicine

## 2021-08-11 ENCOUNTER — Encounter: Payer: Self-pay | Admitting: Family Medicine

## 2021-08-11 VITALS — BP 102/56 | HR 66 | Resp 16 | Wt 150.0 lb

## 2021-08-11 DIAGNOSIS — E7800 Pure hypercholesterolemia, unspecified: Secondary | ICD-10-CM | POA: Diagnosis not present

## 2021-08-11 DIAGNOSIS — E119 Type 2 diabetes mellitus without complications: Secondary | ICD-10-CM | POA: Diagnosis not present

## 2021-08-11 DIAGNOSIS — K649 Unspecified hemorrhoids: Secondary | ICD-10-CM | POA: Diagnosis not present

## 2021-08-11 DIAGNOSIS — R634 Abnormal weight loss: Secondary | ICD-10-CM | POA: Diagnosis not present

## 2021-08-11 DIAGNOSIS — K625 Hemorrhage of anus and rectum: Secondary | ICD-10-CM

## 2021-08-11 DIAGNOSIS — I2510 Atherosclerotic heart disease of native coronary artery without angina pectoris: Secondary | ICD-10-CM | POA: Diagnosis not present

## 2021-08-11 NOTE — Patient Instructions (Signed)
Stop second dose of metformin. Use tub bath instead of shower for the next week or 2 to help hemorrhoids.

## 2021-08-11 NOTE — Progress Notes (Signed)
Established patient visit  I,[name],acting as a scribe for [name], MD.,have documented all relevant documentation on the behalf of [name], MD,as directed by  [name], MD while in the presence of [name], MD.   Patient: Carl Dominguez   DOB: 1945/04/25   76 y.o. Male  MRN: [id number] Visit Date: 08/11/2021  Today's healthcare provider: [name], MD   Chief Complaint  Patient presents with   Rectal Pain   Subjective    HPI  Patient is here concerning rectum pain. Patient states he believes pain is due to bowel movement frequency. Patient states he always has blood in his stool.  Addition to the above symptoms he has had weight loss of about 5 pounds a month for the past couple of months.  This is unintentional.  Other than this he feels well.  Medications: Outpatient Medications Prior to Visit  Medication Sig   acetaminophen (TYLENOL) 500 MG tablet Take 500 mg by mouth 2 (two) times daily.   aspirin 81 MG tablet Take 81 mg by mouth daily.    atorvastatin (LIPITOR) 40 MG tablet TAKE 1 TABLET AT BEDTIME   Blood Glucose Calibration (TRUE METRIX LEVEL 1) Low SOLN 1 each by In Vitro route daily.   cetirizine (ZYRTEC) 10 MG tablet Take 1 tablet (10 mg total) by mouth daily.   cholecalciferol (VITAMIN D) 1000 UNITS tablet Take 1,000 Units by mouth daily.    cholestyramine (QUESTRAN) 4 GM/DOSE powder Take one 4 gram scoop daily.  No other medication 4 hours before or after.   famotidine (PEPCID) 20 MG tablet Take 20 mg by mouth daily.    glipiZIDE (GLUCOTROL) 10 MG tablet TAKE 1 TABLET EVERY DAY   latanoprost (XALATAN) 0.005 % ophthalmic solution Place 1 drop into both eyes at bedtime.    losartan (COZAAR) 50 MG tablet Take 1 tablet (50 mg total) by mouth daily.   metFORMIN (GLUCOPHAGE) 500 MG tablet Take 1 tablet (500 mg total) by mouth 2 (two) times daily with a meal.   metoprolol succinate (TOPROL-XL) 25 MG 24 hr tablet  TAKE 1 TABLET EVERY DAY   montelukast (SINGULAIR) 10 MG tablet TAKE 1 TABLET AT BEDTIME   pioglitazone (ACTOS) 45 MG tablet Take 1 tablet (45 mg total) by mouth daily.   psyllium (METAMUCIL SMOOTH TEXTURE) 28 % packet Take 1 packet by mouth 2 (two) times daily.    timolol (TIMOPTIC) 0.5 % ophthalmic solution 1 drop every morning.   TRUE METRIX BLOOD GLUCOSE TEST test strip USE ONE TIME DAILY AS DIRECTED   TRUEplus Lancets 33G MISC USE ONE TIME DAILY AS DIRECTED   No facility-administered medications prior to visit.    Review of Systems  Constitutional:  Negative for appetite change, chills and fever.  Respiratory:  Negative for chest tightness, shortness of breath and wheezing.   Cardiovascular:  Negative for chest pain and palpitations.  Gastrointestinal:  Positive for rectal pain. Negative for abdominal pain, nausea and vomiting.    Last hemoglobin A1c Lab Results  Component Value Date   HGBA1C 7.1 (H) 05/20/2021       Objective    BP (!) 102/56 (BP Location: Right Arm, Patient Position: Sitting, Cuff Size: Normal)   Pulse 66   Resp 16   Wt 150 lb (68 kg)   SpO2 97%   BMI 23.49 kg/m  BP Readings from Last 3 Encounters:  08/11/21 (!) 102/56  06/23/21 (!) 168/89  05/20/21 137/67   Wt Readings from Last 3 Encounters:  08/11/21 150 lb (68 kg)  06/23/21 155 lb (70.3 kg)  05/20/21 160 lb (72.6 kg)      Physical Exam Vitals reviewed.  Constitutional:      General: He is not in acute distress.    Appearance: He is well-developed.  HENT:     Head: Normocephalic and atraumatic.     Right Ear: Hearing normal.     Left Ear: Hearing normal.     Nose: Nose normal.  Eyes:     General: Lids are normal. No scleral icterus.       Right eye: No discharge.        Left eye: No discharge.     Conjunctiva/sclera: Conjunctivae normal.  Cardiovascular:     Rate and Rhythm: Normal rate and regular rhythm.     Heart sounds: Normal heart sounds.  Pulmonary:     Effort: Pulmonary  effort is normal. No respiratory distress.  Genitourinary:    Comments: Perianal exam reveals obvious leading hemorrhoid.  DRE reveals no rectal masses. Skin:    Findings: No lesion or rash.  Neurological:     General: No focal deficit present.     Mental Status: He is alert and oriented to person, place, and time.  Psychiatric:        Mood and Affect: Mood normal.        Speech: Speech normal.        Behavior: Behavior normal.        Thought Content: Thought content normal.        Judgment: Judgment normal.       No results found for any visits on 08/11/21.  Assessment & Plan     1. Rectal bleeding This almost certainly appears to be hemorrhoidal.  With sits baths.  - CBC w/Diff/Platelet  2. Weight loss, unintentional Discussed at some length with the patient.  He is comfortable following this and following up in about a month.  If he continues to lose weight we will need to work this up. - CBC w/Diff/Platelet - Comprehensive Metabolic Panel (CMET) - TSH  3. Type 2 diabetes mellitus without complication, without long-term current use of insulin (HCC) Good control. - Comprehensive Metabolic Panel (CMET)  4. CAD in native artery All risk factors treated - Comprehensive Metabolic Panel (CMET)  5. Bleeding hemorrhoid No topicals.  Treat with hot water sitz bath's. May need referral but right now I think just settling this down with hot water - CBC w/Diff/Platelet  6. Pure hypercholesterolemia On statin   No follow-ups on file.      I, [name], MD, have reviewed all documentation for this visit. The documentation on 08/16/21 for the exam, diagnosis, procedures, and orders are all accurate and complete.    Shunda Rabadi Cranford Mon, MD  [facility] [phone] (phone) [phone] (fax)  [place]

## 2021-08-12 LAB — CBC WITH DIFFERENTIAL/PLATELET
Basophils Absolute: 0 10*3/uL (ref 0.0–0.2)
Basos: 0 %
EOS (ABSOLUTE): 0.1 10*3/uL (ref 0.0–0.4)
Eos: 2 %
Hematocrit: 38.2 % (ref 37.5–51.0)
Hemoglobin: 13 g/dL (ref 13.0–17.7)
Immature Grans (Abs): 0.1 10*3/uL (ref 0.0–0.1)
Immature Granulocytes: 1 %
Lymphocytes Absolute: 1.8 10*3/uL (ref 0.7–3.1)
Lymphs: 22 %
MCH: 32.3 pg (ref 26.6–33.0)
MCHC: 34 g/dL (ref 31.5–35.7)
MCV: 95 fL (ref 79–97)
Monocytes Absolute: 0.8 10*3/uL (ref 0.1–0.9)
Monocytes: 9 %
Neutrophils Absolute: 5.3 10*3/uL (ref 1.4–7.0)
Neutrophils: 66 %
Platelets: 170 10*3/uL (ref 150–450)
RBC: 4.02 x10E6/uL — ABNORMAL LOW (ref 4.14–5.80)
RDW: 12.7 % (ref 11.6–15.4)
WBC: 8.1 10*3/uL (ref 3.4–10.8)

## 2021-08-12 LAB — COMPREHENSIVE METABOLIC PANEL
ALT: 16 IU/L (ref 0–44)
AST: 17 IU/L (ref 0–40)
Albumin/Globulin Ratio: 2.3 — ABNORMAL HIGH (ref 1.2–2.2)
Albumin: 4.4 g/dL (ref 3.7–4.7)
Alkaline Phosphatase: 83 IU/L (ref 44–121)
BUN/Creatinine Ratio: 21 (ref 10–24)
BUN: 26 mg/dL (ref 8–27)
Bilirubin Total: 0.8 mg/dL (ref 0.0–1.2)
CO2: 21 mmol/L (ref 20–29)
Calcium: 9.3 mg/dL (ref 8.6–10.2)
Chloride: 101 mmol/L (ref 96–106)
Creatinine, Ser: 1.21 mg/dL (ref 0.76–1.27)
Globulin, Total: 1.9 g/dL (ref 1.5–4.5)
Glucose: 226 mg/dL — ABNORMAL HIGH (ref 70–99)
Potassium: 5.2 mmol/L (ref 3.5–5.2)
Sodium: 138 mmol/L (ref 134–144)
Total Protein: 6.3 g/dL (ref 6.0–8.5)
eGFR: 61 mL/min/{1.73_m2} (ref >59)

## 2021-08-12 LAB — TSH: TSH: 1.4 u[IU]/mL (ref 0.450–4.500)

## 2021-08-14 ENCOUNTER — Telehealth: Payer: Self-pay | Admitting: *Deleted

## 2021-08-14 NOTE — Telephone Encounter (Signed)
Returned call to patient. Advised Dr. Rosanna Randy is out today, but will be back Monday. Please advise results?

## 2021-08-14 NOTE — Telephone Encounter (Signed)
Copied from [place] [id number]. Topic: General - Other >> Aug 14, 2021 10:05 AM Cyndi Bender wrote: Reason for CRM: Pt called for most recent lab results. Pt requests call back at either phone# [phone] or [phone]

## 2021-08-23 ENCOUNTER — Other Ambulatory Visit: Payer: Self-pay | Admitting: Family Medicine

## 2021-08-23 DIAGNOSIS — J302 Other seasonal allergic rhinitis: Secondary | ICD-10-CM

## 2021-08-25 NOTE — Progress Notes (Unsigned)
Established patient visit  I,Carl Dominguez,acting as a scribe for [name], MD.,have documented all relevant documentation on the behalf of [name], MD,as directed by  [name], MD while in the presence of [name], MD.   Patient: Carl Dominguez   DOB: Jan 15, 1946   76 y.o. Male  MRN: [id number] Visit Date: 08/26/2021  Today's healthcare provider: [name], MD   Chief Complaint  Patient presents with   Follow-up   Diabetes   Hypertension   Subjective    HPI  Patient is accompanied by his wife.  He has lost 2 more pounds but we got into a lengthy discussion as he is eating less and less and and control his diabetes better.  He is very worried about this and his not eating. Otherwise he feels well, he is having no hypoglycemia. He denies significant anxiety or depression. He obviously has some anxiety about controlling his health issues. The hemorrhoidal issues from his last visit resolved with sitz bath.  Diabetes Mellitus Type II, follow-up  Lab Results  Component Value Date   HGBA1C 9.2 (A) 08/26/2021   HGBA1C 7.1 (H) 05/20/2021   HGBA1C 6.7 (A) 12/17/2020   Last seen for diabetes 3 months ago.  Management since then includes; We will increase metformin 500 mg daily to twice daily.  Patient to watch for hypoglycemia as he is on glipizide.  Follow-up with A1c on next visit.  Home blood sugar records: fasting range: 270-304 Most Recent Eye Exam: 01/26/2021  --------------------------------------------------------------------------------------------------- Hypertension, follow-up  BP Readings from Last 3 Encounters:  08/26/21 113/64  08/11/21 (!) 102/56  06/23/21 (!) 168/89   Wt Readings from Last 3 Encounters:  08/26/21 148 lb 4.8 oz (67.3 kg)  08/11/21 150 lb (68 kg)  06/23/21 155 lb (70.3 kg)     He was last seen for hypertension 3 months ago.  Management since that visit includes; Good control on losartan  and metoprolol.  Outside blood pressures are   --------------------------------------------------------------------------------------------------   Medications: Outpatient Medications Prior to Visit  Medication Sig   acetaminophen (TYLENOL) 500 MG tablet Take 500 mg by mouth 2 (two) times daily.   aspirin 81 MG tablet Take 81 mg by mouth daily.    atorvastatin (LIPITOR) 40 MG tablet TAKE 1 TABLET AT BEDTIME   Blood Glucose Calibration (TRUE METRIX LEVEL 1) Low SOLN 1 each by In Vitro route daily.   cetirizine (ZYRTEC) 10 MG tablet TAKE 1 TABLET EVERY DAY   cholecalciferol (VITAMIN D) 1000 UNITS tablet Take 1,000 Units by mouth daily.    cholestyramine (QUESTRAN) 4 GM/DOSE powder Take one 4 gram scoop daily.  No other medication 4 hours before or after.   famotidine (PEPCID) 20 MG tablet Take 20 mg by mouth daily.    glipiZIDE (GLUCOTROL) 10 MG tablet TAKE 1 TABLET EVERY DAY   latanoprost (XALATAN) 0.005 % ophthalmic solution Place 1 drop into both eyes at bedtime.    losartan (COZAAR) 50 MG tablet Take 1 tablet (50 mg total) by mouth daily.   metFORMIN (GLUCOPHAGE) 500 MG tablet Take 1 tablet (500 mg total) by mouth 2 (two) times daily with a meal.   metoprolol succinate (TOPROL-XL) 25 MG 24 hr tablet TAKE 1 TABLET EVERY DAY   montelukast (SINGULAIR) 10 MG tablet TAKE 1 TABLET AT BEDTIME   pioglitazone (ACTOS) 45 MG tablet Take 1 tablet (45 mg total) by mouth daily.   psyllium (METAMUCIL SMOOTH TEXTURE) 28 %  packet Take 1 packet by mouth 2 (two) times daily.    timolol (TIMOPTIC) 0.5 % ophthalmic solution 1 drop every morning.   TRUE METRIX BLOOD GLUCOSE TEST test strip USE ONE TIME DAILY AS DIRECTED   TRUEplus Lancets 33G MISC USE ONE TIME DAILY AS DIRECTED   No facility-administered medications prior to visit.    Review of Systems  Constitutional:  Negative for appetite change, chills and fever.  Respiratory:  Negative for chest tightness, shortness of breath and wheezing.    Cardiovascular:  Negative for chest pain and palpitations.  Gastrointestinal:  Negative for abdominal pain, nausea and vomiting.    Last hemoglobin A1c Lab Results  Component Value Date   HGBA1C 9.2 (A) 08/26/2021       Objective    BP 113/64 (BP Location: Right Arm, Patient Position: Sitting, Cuff Size: Normal)   Pulse 61   Resp 16   Wt 148 lb 4.8 oz (67.3 kg)   SpO2 96%   BMI 23.23 kg/m  BP Readings from Last 3 Encounters:  08/26/21 113/64  08/11/21 (!) 102/56  06/23/21 (!) 168/89   Wt Readings from Last 3 Encounters:  08/26/21 148 lb 4.8 oz (67.3 kg)  08/11/21 150 lb (68 kg)  06/23/21 155 lb (70.3 kg)      Physical Exam Vitals reviewed.  Constitutional:      Appearance: Normal appearance. He is well-developed.  HENT:     Head: Normocephalic and atraumatic.     Right Ear: External ear normal.     Left Ear: External ear normal.     Nose: Nose normal.  Eyes:     General: No scleral icterus.    Conjunctiva/sclera: Conjunctivae normal.  Neck:     Thyroid: No thyromegaly.     Vascular: No carotid bruit.  Cardiovascular:     Rate and Rhythm: Normal rate and regular rhythm.     Pulses: Normal pulses.     Heart sounds: Normal heart sounds.  Pulmonary:     Effort: Pulmonary effort is normal.     Breath sounds: Normal breath sounds.  Abdominal:     Palpations: Abdomen is soft.  Musculoskeletal:     Cervical back: Neck supple.     Right lower leg: No edema.     Left lower leg: No edema.  Lymphadenopathy:     Cervical: No cervical adenopathy.  Skin:    General: Skin is warm and dry.  Neurological:     General: No focal deficit present.     Mental Status: He is alert and oriented to person, place, and time.     Comments: Monofilament foot exam is normal.  Psychiatric:        Mood and Affect: Mood normal.        Behavior: Behavior normal.        Thought Content: Thought content normal.        Judgment: Judgment normal.         08/26/2021    9:16 AM  05/20/2021    8:23 AM 04/09/2021    1:07 PM  Depression screen PHQ 2/9  Decreased Interest 0 0 0  Down, Depressed, Hopeless 1 0 0  PHQ - 2 Score 1 0 0  Altered sleeping 0 0   Tired, decreased energy 0 0   Change in appetite 0 0   Feeling bad or failure about yourself  0 0   Trouble concentrating 0 0   Moving slowly or fidgety/restless 0 0  Suicidal thoughts 0 0   PHQ-9 Score 1 0   Difficult doing work/chores Not difficult at all Not difficult at all        08/26/2021    9:16 AM  GAD 7 : Generalized Anxiety Score  Nervous, Anxious, on Edge 1  Control/stop worrying 0  Worry too much - different things 1  Trouble relaxing 0  Restless 0  Easily annoyed or irritable 3  Afraid - awful might happen 0  Total GAD 7 Score 5  Anxiety Difficulty Not difficult at all     Results for orders placed or performed in visit on 08/26/21  POCT glycosylated hemoglobin (Hb A1C)  Result Value Ref Range   Hemoglobin A1C 9.2 (A) 4.0 - 5.6 %   Est. average glucose Bld gHb Est-mCnc 217     Assessment & Plan     1. Type 2 diabetes mellitus without complication, without long-term current use of insulin (HCC) Very lengthy discussion with patient and his wife Rena start insulin at 5 units of Lantus daily after their trip to the beach.  I will see him back in 3 months.  If he feels poorly in the meantime we will get back in. He is advised to watch closely for any hypoglycemia.  We will titrate the insulin dose upward as necessary.  If he develops anything that looks like hypoglycemia will be quick to stop glipizide - POCT glycosylated hemoglobin (Hb A1C)  2. Weight loss, unintentional See above.  Hopefully adding the insulin will allow him better sugar control and will help him with his weight.   3. CAD S/P percutaneous coronary angioplasty All risk factors treated  4. Essential (primary) hypertension Good control  5. Internal hemorrhoids Symptoms resolved  6. Hypertension associated with  diabetes ([place])   7. Seasonal allergic rhinitis, unspecified trigger   8. Pure hypercholesterolemia On statin    Return in about 4 weeks (around 09/23/2021) for chronic disease f/u.      I, [name], MD, have reviewed all documentation for this visit. The documentation on 08/27/21 for the exam, diagnosis, procedures, and orders are all accurate and complete.    Raelee Rossmann Cranford Mon, MD  [facility] [phone] (phone) [phone] (fax)  [place]

## 2021-08-26 ENCOUNTER — Encounter: Payer: Self-pay | Admitting: Family Medicine

## 2021-08-26 ENCOUNTER — Ambulatory Visit (INDEPENDENT_AMBULATORY_CARE_PROVIDER_SITE_OTHER): Payer: Medicare HMO | Admitting: Family Medicine

## 2021-08-26 VITALS — BP 113/64 | HR 61 | Resp 16 | Wt 148.3 lb

## 2021-08-26 DIAGNOSIS — R634 Abnormal weight loss: Secondary | ICD-10-CM

## 2021-08-26 DIAGNOSIS — J302 Other seasonal allergic rhinitis: Secondary | ICD-10-CM | POA: Diagnosis not present

## 2021-08-26 DIAGNOSIS — Z9861 Coronary angioplasty status: Secondary | ICD-10-CM

## 2021-08-26 DIAGNOSIS — M9902 Segmental and somatic dysfunction of thoracic region: Secondary | ICD-10-CM | POA: Diagnosis not present

## 2021-08-26 DIAGNOSIS — I10 Essential (primary) hypertension: Secondary | ICD-10-CM

## 2021-08-26 DIAGNOSIS — M9903 Segmental and somatic dysfunction of lumbar region: Secondary | ICD-10-CM | POA: Diagnosis not present

## 2021-08-26 DIAGNOSIS — E119 Type 2 diabetes mellitus without complications: Secondary | ICD-10-CM | POA: Diagnosis not present

## 2021-08-26 DIAGNOSIS — E1159 Type 2 diabetes mellitus with other circulatory complications: Secondary | ICD-10-CM | POA: Diagnosis not present

## 2021-08-26 DIAGNOSIS — E7800 Pure hypercholesterolemia, unspecified: Secondary | ICD-10-CM | POA: Diagnosis not present

## 2021-08-26 DIAGNOSIS — M531 Cervicobrachial syndrome: Secondary | ICD-10-CM | POA: Diagnosis not present

## 2021-08-26 DIAGNOSIS — M546 Pain in thoracic spine: Secondary | ICD-10-CM | POA: Diagnosis not present

## 2021-08-26 DIAGNOSIS — I2510 Atherosclerotic heart disease of native coronary artery without angina pectoris: Secondary | ICD-10-CM

## 2021-08-26 DIAGNOSIS — K648 Other hemorrhoids: Secondary | ICD-10-CM

## 2021-08-26 DIAGNOSIS — M9901 Segmental and somatic dysfunction of cervical region: Secondary | ICD-10-CM | POA: Diagnosis not present

## 2021-08-26 DIAGNOSIS — M545 Low back pain, unspecified: Secondary | ICD-10-CM | POA: Diagnosis not present

## 2021-08-26 DIAGNOSIS — I152 Hypertension secondary to endocrine disorders: Secondary | ICD-10-CM

## 2021-08-26 LAB — POCT GLYCOSYLATED HEMOGLOBIN (HGB A1C)
Est. average glucose Bld gHb Est-mCnc: 217
Hemoglobin A1C: 9.2 % — AB (ref 4.0–5.6)

## 2021-08-26 MED ORDER — INSULIN GLARGINE 100 UNIT/ML SOLOSTAR PEN: 5.0000 [IU] | PEN_INJECTOR | Freq: Every day | SUBCUTANEOUS | 6 refills | Status: DC

## 2021-08-27 DIAGNOSIS — H401112 Primary open-angle glaucoma, right eye, moderate stage: Secondary | ICD-10-CM | POA: Diagnosis not present

## 2021-08-27 DIAGNOSIS — Z0100 Encounter for examination of eyes and vision without abnormal findings: Secondary | ICD-10-CM | POA: Diagnosis not present

## 2021-09-11 ENCOUNTER — Other Ambulatory Visit: Payer: Self-pay | Admitting: Physician Assistant

## 2021-09-11 ENCOUNTER — Ambulatory Visit (INDEPENDENT_AMBULATORY_CARE_PROVIDER_SITE_OTHER): Payer: Medicare HMO | Admitting: Physician Assistant

## 2021-09-11 ENCOUNTER — Ambulatory Visit: Payer: Medicare HMO | Admitting: Physician Assistant

## 2021-09-11 DIAGNOSIS — Z7985 Long-term (current) use of injectable non-insulin antidiabetic drugs: Secondary | ICD-10-CM | POA: Diagnosis not present

## 2021-09-11 DIAGNOSIS — E119 Type 2 diabetes mellitus without complications: Secondary | ICD-10-CM

## 2021-09-11 MED ORDER — INSULIN PEN NEEDLE 32G X 4 MM MISC: 1 refills | Status: DC

## 2021-09-11 NOTE — Progress Notes (Unsigned)
     I,Sha'taria Tyson,acting as a Education administrator for [employer], PA-C.,have documented all relevant documentation on the behalf of Mikey Kirschner, PA-C,as directed by  Mikey Kirschner, PA-C while in the presence of Mikey Kirschner, PA-C.   Established patient visit   Patient: Carl Dominguez   DOB: [birth year]   76 y.o. Male  MRN: [id number] Visit Date: 09/11/2021  Today's healthcare provider: Mikey Kirschner, PA-C   No chief complaint on file.  Subjective    HPI  Patient is in today to learn how to use his lantus that was newly prescried at patient last office visit. Instruction was given on how to properly adjust dosage before self administering, how to place needles on pen and dispose needles once injection is given and how to properly inject insulin. Patient properly demonstrated how to self administer rx while he was in office. Patient was provided with sample needles while rx was placed as patient did not receive them with insulin rx.   Medications: Outpatient Medications Prior to Visit  Medication Sig   acetaminophen (TYLENOL) 500 MG tablet Take 500 mg by mouth 2 (two) times daily.   aspirin 81 MG tablet Take 81 mg by mouth daily.    atorvastatin (LIPITOR) 40 MG tablet TAKE 1 TABLET AT BEDTIME   Blood Glucose Calibration (TRUE METRIX LEVEL 1) Low SOLN 1 each by In Vitro route daily.   cetirizine (ZYRTEC) 10 MG tablet TAKE 1 TABLET EVERY DAY   cholecalciferol (VITAMIN D) 1000 UNITS tablet Take 1,000 Units by mouth daily.    cholestyramine (QUESTRAN) 4 GM/DOSE powder Take one 4 gram scoop daily.  No other medication 4 hours before or after.   famotidine (PEPCID) 20 MG tablet Take 20 mg by mouth daily.    glipiZIDE (GLUCOTROL) 10 MG tablet TAKE 1 TABLET EVERY DAY   insulin glargine (LANTUS) 100 UNIT/ML Solostar Pen Inject 5 Units into the skin daily. Up to a max of 30 units daily.   latanoprost (XALATAN) 0.005 % ophthalmic solution Place 1 drop into both eyes at bedtime.    losartan  (COZAAR) 50 MG tablet Take 1 tablet (50 mg total) by mouth daily.   metFORMIN (GLUCOPHAGE) 500 MG tablet Take 1 tablet (500 mg total) by mouth 2 (two) times daily with a meal.   metoprolol succinate (TOPROL-XL) 25 MG 24 hr tablet TAKE 1 TABLET EVERY DAY   montelukast (SINGULAIR) 10 MG tablet TAKE 1 TABLET AT BEDTIME   pioglitazone (ACTOS) 45 MG tablet Take 1 tablet (45 mg total) by mouth daily.   psyllium (METAMUCIL SMOOTH TEXTURE) 28 % packet Take 1 packet by mouth 2 (two) times daily.    timolol (TIMOPTIC) 0.5 % ophthalmic solution 1 drop every morning.   TRUE METRIX BLOOD GLUCOSE TEST test strip USE ONE TIME DAILY AS DIRECTED   TRUEplus Lancets 33G MISC USE ONE TIME DAILY AS DIRECTED   No facility-administered medications prior to visit.    Review of Systems  {Labs  Heme  Chem  Endocrine  Serology  Results Review (optional):23779}   Objective    There were no vitals taken for this visit. {Show previous vital signs (optional):23777}  Physical Exam  ***  No results found for any visits on 09/11/21.  Assessment & Plan     ***  No follow-ups on file.      {provider attestation***:1}   Mikey Kirschner, PA-C  [facility] [phone] (phone) [phone] (fax)  [place]

## 2021-09-21 ENCOUNTER — Ambulatory Visit: Payer: Medicare HMO | Admitting: Family Medicine

## 2021-09-22 DIAGNOSIS — M9901 Segmental and somatic dysfunction of cervical region: Secondary | ICD-10-CM | POA: Diagnosis not present

## 2021-09-22 DIAGNOSIS — M546 Pain in thoracic spine: Secondary | ICD-10-CM | POA: Diagnosis not present

## 2021-09-22 DIAGNOSIS — M545 Low back pain, unspecified: Secondary | ICD-10-CM | POA: Diagnosis not present

## 2021-09-22 DIAGNOSIS — M531 Cervicobrachial syndrome: Secondary | ICD-10-CM | POA: Diagnosis not present

## 2021-09-22 DIAGNOSIS — M9902 Segmental and somatic dysfunction of thoracic region: Secondary | ICD-10-CM | POA: Diagnosis not present

## 2021-09-22 DIAGNOSIS — M9903 Segmental and somatic dysfunction of lumbar region: Secondary | ICD-10-CM | POA: Diagnosis not present

## 2021-09-22 NOTE — Progress Notes (Unsigned)
Argentina Ponder De[place]anto,acting as a scribe for [name], MD.,have documented all relevant documentation on the behalf of [name], MD,as directed by  [name], MD while in the presence of [name], MD.     Established patient visit   Patient: Carl Dominguez   DOB: 08/12/1945   76 y.o. Male  MRN: [id number] Visit Date: 09/23/2021  Today's healthcare provider: [name], MD   No chief complaint on file.  Subjective    HPI  Patient is feeling better and is gained 4 pounds since his last visit.  He is tolerating insulin well. He has had 2 episodes of blood sugar below 70 but these were easy to deal with.  He and his wife are both pleased that he is feeling better and has gained 4 pounds.  Diabetes Mellitus Type II, Follow-up  Lab Results  Component Value Date   HGBA1C 9.2 (A) 08/26/2021   HGBA1C 7.1 (H) 05/20/2021   HGBA1C 6.7 (A) 12/17/2020   Wt Readings from Last 3 Encounters:  09/23/21 152 lb (68.9 kg)  08/26/21 148 lb 4.8 oz (67.3 kg)  08/11/21 150 lb (68 kg)   Last seen for diabetes 1 months ago.  Management since then includes starting Lantus insulin at 5 iu daily  Symptoms: No fatigue No foot ulcerations  No appetite changes No nausea  No paresthesia of the feet  No polydipsia  No polyuria No visual disturbances   No vomiting     Home blood sugar records:  101-181  Episodes of hypoglycemia? No      Pertinent Labs: Lab Results  Component Value Date   CHOL 126 09/15/2020   HDL 50 09/15/2020   LDLCALC 53 09/15/2020   TRIG 134 09/15/2020   CHOLHDL 2.5 09/15/2020   Lab Results  Component Value Date   NA 138 08/11/2021   K 5.2 08/11/2021   CREATININE 1.21 08/11/2021   EGFR 61 08/11/2021   MICROALBUR 20 11/23/2017     ---------------------------------------------------------------------------------------------------   Medications: Outpatient Medications Prior to Visit  Medication Sig   acetaminophen  (TYLENOL) 500 MG tablet Take 500 mg by mouth 2 (two) times daily.   aspirin 81 MG tablet Take 81 mg by mouth daily.    atorvastatin (LIPITOR) 40 MG tablet TAKE 1 TABLET AT BEDTIME   Blood Glucose Calibration (TRUE METRIX LEVEL 1) Low SOLN 1 each by In Vitro route daily.   cetirizine (ZYRTEC) 10 MG tablet TAKE 1 TABLET EVERY DAY   cholecalciferol (VITAMIN D) 1000 UNITS tablet Take 1,000 Units by mouth daily.    cholestyramine (QUESTRAN) 4 GM/DOSE powder Take one 4 gram scoop daily.  No other medication 4 hours before or after.   famotidine (PEPCID) 20 MG tablet Take 20 mg by mouth daily.    glipiZIDE (GLUCOTROL) 10 MG tablet TAKE 1 TABLET EVERY DAY   insulin glargine (LANTUS) 100 UNIT/ML Solostar Pen Inject 5 Units into the skin daily. Up to a max of 30 units daily.   Insulin Pen Needle 32G X 4 MM MISC Use with lantus insulin for one injection daily. Replace needle for each injection   latanoprost (XALATAN) 0.005 % ophthalmic solution Place 1 drop into both eyes at bedtime.    losartan (COZAAR) 50 MG tablet Take 1 tablet (50 mg total) by mouth daily.   metFORMIN (GLUCOPHAGE) 500 MG tablet Take 1 tablet (500 mg total) by mouth 2 (two) times daily with a meal.   metoprolol succinate (TOPROL-XL)  25 MG 24 hr tablet TAKE 1 TABLET EVERY DAY   montelukast (SINGULAIR) 10 MG tablet TAKE 1 TABLET AT BEDTIME   pioglitazone (ACTOS) 45 MG tablet Take 1 tablet (45 mg total) by mouth daily.   psyllium (METAMUCIL SMOOTH TEXTURE) 28 % packet Take 1 packet by mouth 2 (two) times daily.    timolol (TIMOPTIC) 0.5 % ophthalmic solution 1 drop every morning.   TRUE METRIX BLOOD GLUCOSE TEST test strip USE ONE TIME DAILY AS DIRECTED   TRUEplus Lancets 33G MISC USE ONE TIME DAILY AS DIRECTED   No facility-administered medications prior to visit.    Review of Systems  Last hemoglobin A1c Lab Results  Component Value Date   HGBA1C 9.2 (A) 08/26/2021       Objective    BP 129/70 (BP Location: Right Arm,  Patient Position: Sitting, Cuff Size: Normal)   Pulse 63   Temp 97.7 F (36.5 C) (Oral)   Wt 152 lb (68.9 kg)   SpO2 98%   BMI 23.81 kg/m  BP Readings from Last 3 Encounters:  09/23/21 129/70  08/26/21 113/64  08/11/21 (!) 102/56   Wt Readings from Last 3 Encounters:  09/23/21 152 lb (68.9 kg)  08/26/21 148 lb 4.8 oz (67.3 kg)  08/11/21 150 lb (68 kg)      Physical Exam Vitals reviewed.  Constitutional:      Appearance: Normal appearance. He is well-developed.  HENT:     Head: Normocephalic and atraumatic.     Right Ear: External ear normal.     Left Ear: External ear normal.     Nose: Nose normal.  Eyes:     General: No scleral icterus.    Conjunctiva/sclera: Conjunctivae normal.  Neck:     Thyroid: No thyromegaly.     Vascular: No carotid bruit.  Cardiovascular:     Rate and Rhythm: Normal rate and regular rhythm.     Pulses: Normal pulses.     Heart sounds: Normal heart sounds.  Pulmonary:     Effort: Pulmonary effort is normal.     Breath sounds: Normal breath sounds.  Abdominal:     Palpations: Abdomen is soft.  Musculoskeletal:     Cervical back: Neck supple.     Right lower leg: No edema.     Left lower leg: No edema.  Lymphadenopathy:     Cervical: No cervical adenopathy.  Skin:    General: Skin is warm and dry.  Neurological:     General: No focal deficit present.     Mental Status: He is alert and oriented to person, place, and time.     Comments: Monofilament foot exam is normal.  Psychiatric:        Mood and Affect: Mood normal.        Behavior: Behavior normal.        Thought Content: Thought content normal.        Judgment: Judgment normal.       No results found for any visits on 09/23/21.  Assessment & Plan     1. Diabetes mellitus due to underlying condition with stage 3 chronic kidney disease, with long-term current use of insulin, unspecified whether stage 3a or 3b CKD ([place])  I will see him back in October repeat A1c.  Goal is  less than 8.5 Could consider using continuous glucose monitor  2. Weight loss, unintentional Patient has gained 4 pounds on the insulin.  I think he was so worried about controlling all his  risk factors he was not eating.  I am very encouraged with this 4 pound weight gain Stop cholestyramine. 3. CAD S/P percutaneous coronary angioplasty All risk factors treated  4. Seasonal allergic rhinitis, unspecified trigger   5. Pure hypercholesterolemia On statin   No follow-ups on file.      I, [name], MD, have reviewed all documentation for this visit. The documentation on 09/23/21 for the exam, diagnosis, procedures, and orders are all accurate and complete.    Robbin Loughmiller Cranford Mon, MD  [facility] [phone] (phone) [phone] (fax)  [place]

## 2021-09-23 ENCOUNTER — Ambulatory Visit (INDEPENDENT_AMBULATORY_CARE_PROVIDER_SITE_OTHER): Payer: Medicare HMO | Admitting: Family Medicine

## 2021-09-23 VITALS — BP 129/70 | HR 63 | Temp 97.7°F | Wt 152.0 lb

## 2021-09-23 DIAGNOSIS — E7800 Pure hypercholesterolemia, unspecified: Secondary | ICD-10-CM | POA: Diagnosis not present

## 2021-09-23 DIAGNOSIS — E0822 Diabetes mellitus due to underlying condition with diabetic chronic kidney disease: Secondary | ICD-10-CM

## 2021-09-23 DIAGNOSIS — Z794 Long term (current) use of insulin: Secondary | ICD-10-CM | POA: Diagnosis not present

## 2021-09-23 DIAGNOSIS — J302 Other seasonal allergic rhinitis: Secondary | ICD-10-CM

## 2021-09-23 DIAGNOSIS — I2510 Atherosclerotic heart disease of native coronary artery without angina pectoris: Secondary | ICD-10-CM

## 2021-09-23 DIAGNOSIS — N183 Chronic kidney disease, stage 3 unspecified: Secondary | ICD-10-CM

## 2021-09-23 DIAGNOSIS — R634 Abnormal weight loss: Secondary | ICD-10-CM

## 2021-09-23 DIAGNOSIS — Z9861 Coronary angioplasty status: Secondary | ICD-10-CM

## 2021-09-23 MED ORDER — FREESTYLE LIBRE 3 SENSOR MISC: 12 refills | Status: DC

## 2021-09-23 NOTE — Addendum Note (Signed)
Addended by: Althea Charon D on: 09/23/2021 02:24 PM   Modules accepted: Orders

## 2021-09-28 DIAGNOSIS — H2512 Age-related nuclear cataract, left eye: Secondary | ICD-10-CM | POA: Diagnosis not present

## 2021-10-01 ENCOUNTER — Encounter: Payer: Self-pay | Admitting: Ophthalmology

## 2021-10-07 ENCOUNTER — Other Ambulatory Visit: Payer: Self-pay | Admitting: Family Medicine

## 2021-10-07 DIAGNOSIS — J301 Allergic rhinitis due to pollen: Secondary | ICD-10-CM

## 2021-10-07 NOTE — Anesthesia Preprocedure Evaluation (Addendum)
Anesthesia Evaluation  Patient identified by MRN, date of birth, ID band Patient awake    Reviewed: Allergy & Precautions, NPO status , Patient's Chart, lab work & pertinent test results  History of Anesthesia Complications Negative for: history of anesthetic complications  Airway Mallampati: III  TM Distance: >3 FB Neck ROM: full    Dental no notable dental hx.    Pulmonary neg pulmonary ROS, former smoker,    Pulmonary exam normal        Cardiovascular hypertension, + CAD, + Past MI and + Cardiac Stents  Normal cardiovascular exam  H/o MI 1999 with two stents- no issues since that time.    Neuro/Psych negative neurological ROS  negative psych ROS   GI/Hepatic Neg liver ROS, GERD  ,  Endo/Other  diabetes, Poorly Controlled  Renal/GU CRFRenal disease     Musculoskeletal   Abdominal Normal abdominal exam  (+)   Peds  Hematology negative hematology ROS (+)   Anesthesia Other Findings Past Medical History: No date: Diabetes mellitus without complication (HCC) No date: Heart disease No date: Hyperlipidemia No date: Hypertension 1999: Myocardial infarction [facility])  Past Surgical History: 03/24/2016: CATARACT EXTRACTION W/PHACO; Right     Comment:  Procedure: CATARACT EXTRACTION PHACO AND INTRAOCULAR               LENS PLACEMENT ([place])  Right;  Surgeon: Leandrew Koyanagi, MD;  Location: [place];  Service:              Ophthalmology;  Laterality: Right;  Diabetic - oral               meds right eye 2010: CHOLECYSTECTOMY 1999: CORONARY ANGIOPLASTY WITH STENT PLACEMENT No date: HERNIA REPAIR  BMI    Body Mass Index: 23.81 kg/m      Reproductive/Obstetrics negative OB ROS                            Anesthesia Physical Anesthesia Plan  ASA: 3  Anesthesia Plan: MAC   Post-op Pain Management:    Induction: Intravenous  PONV Risk Score and Plan:    Airway Management Planned: Natural Airway  Additional Equipment:   Intra-op Plan:   Post-operative Plan:   Informed Consent: I have reviewed the patients History and Physical, chart, labs and discussed the procedure including the risks, benefits and alternatives for the proposed anesthesia with the patient or authorized representative who has indicated his/her understanding and acceptance.       Plan Discussed with: Anesthesiologist, CRNA and Surgeon  Anesthesia Plan Comments:        Anesthesia Quick Evaluation

## 2021-10-07 NOTE — Discharge Instructions (Signed)

## 2021-10-12 ENCOUNTER — Ambulatory Visit
Admission: RE | Admit: 2021-10-12 | Discharge: 2021-10-12 | Disposition: A | Discharge status: Home / Self Care | Payer: Medicare HMO | Attending: Ophthalmology | Admitting: Ophthalmology

## 2021-10-12 ENCOUNTER — Ambulatory Visit (AMBULATORY_SURGERY_CENTER): Payer: Medicare HMO | Admitting: Anesthesiology

## 2021-10-12 ENCOUNTER — Encounter
Admission: RE | Disposition: A | Discharge status: Home / Self Care | Payer: Self-pay | Source: Home / Self Care | Attending: Ophthalmology

## 2021-10-12 ENCOUNTER — Other Ambulatory Visit: Payer: Self-pay

## 2021-10-12 ENCOUNTER — Ambulatory Visit: Payer: Medicare HMO | Admitting: Anesthesiology

## 2021-10-12 ENCOUNTER — Encounter: Payer: Self-pay | Admitting: Ophthalmology

## 2021-10-12 DIAGNOSIS — Z7984 Long term (current) use of oral hypoglycemic drugs: Secondary | ICD-10-CM | POA: Diagnosis not present

## 2021-10-12 DIAGNOSIS — Z87891 Personal history of nicotine dependence: Secondary | ICD-10-CM | POA: Diagnosis not present

## 2021-10-12 DIAGNOSIS — E1136 Type 2 diabetes mellitus with diabetic cataract: Secondary | ICD-10-CM | POA: Diagnosis not present

## 2021-10-12 DIAGNOSIS — I252 Old myocardial infarction: Secondary | ICD-10-CM | POA: Diagnosis not present

## 2021-10-12 DIAGNOSIS — N189 Chronic kidney disease, unspecified: Secondary | ICD-10-CM

## 2021-10-12 DIAGNOSIS — I129 Hypertensive chronic kidney disease with stage 1 through stage 4 chronic kidney disease, or unspecified chronic kidney disease: Secondary | ICD-10-CM | POA: Diagnosis not present

## 2021-10-12 DIAGNOSIS — E119 Type 2 diabetes mellitus without complications: Secondary | ICD-10-CM

## 2021-10-12 DIAGNOSIS — E1122 Type 2 diabetes mellitus with diabetic chronic kidney disease: Secondary | ICD-10-CM

## 2021-10-12 DIAGNOSIS — Z794 Long term (current) use of insulin: Secondary | ICD-10-CM | POA: Insufficient documentation

## 2021-10-12 DIAGNOSIS — I2510 Atherosclerotic heart disease of native coronary artery without angina pectoris: Secondary | ICD-10-CM | POA: Diagnosis not present

## 2021-10-12 DIAGNOSIS — H2512 Age-related nuclear cataract, left eye: Secondary | ICD-10-CM | POA: Insufficient documentation

## 2021-10-12 HISTORY — PX: CATARACT EXTRACTION W/PHACO: SHX586

## 2021-10-12 LAB — GLUCOSE, CAPILLARY
Glucose-Capillary: 175 mg/dL — ABNORMAL HIGH (ref 70–99)
Glucose-Capillary: 165 mg/dL — ABNORMAL HIGH (ref 70–99)

## 2021-10-12 SURGERY — PHACOEMULSIFICATION, CATARACT, WITH IOL INSERTION
Anesthesia: Monitor Anesthesia Care | Site: Eye | Laterality: Left

## 2021-10-12 MED ORDER — ARMC OPHTHALMIC DILATING DROPS
1.0000 | OPHTHALMIC | Status: AC | PRN
  Administered 2021-10-12 (×3): 1 via OPHTHALMIC

## 2021-10-12 MED ORDER — SIGHTPATH DOSE#1 BSS IO SOLN
INTRAOCULAR | Status: DC | PRN
  Administered 2021-10-12: 1 mL via INTRAMUSCULAR

## 2021-10-12 MED ORDER — SIGHTPATH DOSE#1 NA HYALUR & NA CHOND-NA HYALUR IO KIT
PACK | INTRAOCULAR | Status: DC | PRN
  Administered 2021-10-12: 1 via OPHTHALMIC

## 2021-10-12 MED ORDER — BRIMONIDINE TARTRATE-TIMOLOL 0.2-0.5 % OP SOLN
OPHTHALMIC | Status: DC | PRN
  Administered 2021-10-12: 1 [drp] via OPHTHALMIC

## 2021-10-12 MED ORDER — TETRACAINE HCL 0.5 % OP SOLN
1.0000 [drp] | OPHTHALMIC | Status: AC | PRN
  Administered 2021-10-12 (×3): 1 [drp] via OPHTHALMIC

## 2021-10-12 MED ORDER — FENTANYL CITRATE (PF) 100 MCG/2ML IJ SOLN
INTRAMUSCULAR | Status: DC | PRN
  Administered 2021-10-12: 50 ug via INTRAVENOUS

## 2021-10-12 MED ORDER — CEFUROXIME OPHTHALMIC INJECTION 1 MG/0.1 ML
INJECTION | OPHTHALMIC | Status: DC | PRN
  Administered 2021-10-12: 0.1 mL via INTRACAMERAL

## 2021-10-12 MED ORDER — MIDAZOLAM HCL 2 MG/2ML IJ SOLN
INTRAMUSCULAR | Status: DC | PRN
  Administered 2021-10-12: 1 mg via INTRAVENOUS

## 2021-10-12 MED ORDER — SIGHTPATH DOSE#1 BSS IO SOLN
INTRAOCULAR | Status: DC | PRN
  Administered 2021-10-12: 65 mL via OPHTHALMIC

## 2021-10-12 MED ORDER — SIGHTPATH DOSE#1 BSS IO SOLN
INTRAOCULAR | Status: DC | PRN
  Administered 2021-10-12: 15 mL

## 2021-10-12 SURGICAL SUPPLY — 11 items
CATARACT SUITE SIGHTPATH (MISCELLANEOUS) ×2 IMPLANT
FEE CATARACT SUITE SIGHTPATH (MISCELLANEOUS) ×1 IMPLANT
GLOVE SRG 8 PF TXTR STRL LF DI (GLOVE) ×1 IMPLANT
GLOVE SURG ENC TEXT LTX SZ7.5 (GLOVE) ×2 IMPLANT
GLOVE SURG UNDER POLY LF SZ8 (GLOVE) ×2
LENS IOL ACRYSOF IQ 19.5 (Intraocular Lens) ×1 IMPLANT
NDL FILTER BLUNT 18X1 1/2 (NEEDLE) ×1 IMPLANT
NEEDLE FILTER BLUNT 18X 1/2SAF (NEEDLE) ×1
NEEDLE FILTER BLUNT 18X1 1/2 (NEEDLE) ×1 IMPLANT
SYR 3ML LL SCALE MARK (SYRINGE) ×2 IMPLANT
WATER STERILE IRR 250ML POUR (IV SOLUTION) ×2 IMPLANT

## 2021-10-12 NOTE — Anesthesia Postprocedure Evaluation (Signed)
Anesthesia Post Note  Patient: Carl Dominguez  Procedure(s) Performed: CATARACT EXTRACTION PHACO AND INTRAOCULAR LENS PLACEMENT ([facility]) LEFT 7.86 01:08.4 (Left: Eye)     Patient location during evaluation: PACU Anesthesia Type: MAC Level of consciousness: awake and alert Pain management: pain level controlled Vital Signs Assessment: post-procedure vital signs reviewed and stable Respiratory status: spontaneous breathing, nonlabored ventilation and respiratory function stable Cardiovascular status: blood pressure returned to baseline and stable Postop Assessment: no apparent nausea or vomiting Anesthetic complications: no   No notable events documented.  [place] Ouch

## 2021-10-12 NOTE — Op Note (Signed)
OPERATIVE NOTE  Carl Dominguez [id number] 10/12/2021   PREOPERATIVE DIAGNOSIS:  Nuclear sclerotic cataract left eye. H25.12   POSTOPERATIVE DIAGNOSIS:    Nuclear sclerotic cataract left eye.     PROCEDURE:  Phacoemusification with posterior chamber intraocular lens placement of the left eye  Ultrasound time: Procedure(s): CATARACT EXTRACTION PHACO AND INTRAOCULAR LENS PLACEMENT ([facility]) LEFT 7.86 01:08.4 (Left)  LENS:   Implant Name Type Inv. Item Serial No. Manufacturer Lot No. LRB No. Used Action  LENS IOL ACRYSOF IQ 19.5 - [id number] Intraocular Lens LENS IOL ACRYSOF IQ 19.5 [id number] ALCON  Left 1 Implanted      SURGEON:  Wyonia Hough, MD   ANESTHESIA:  Topical with tetracaine drops and 2% Xylocaine jelly, augmented with 1% preservative-free intracameral lidocaine.    COMPLICATIONS:  None.   DESCRIPTION OF PROCEDURE:  The patient was identified in the holding room and transported to the operating room and placed in the supine position under the operating microscope.  The left eye was identified as the operative eye and it was prepped and draped in the usual sterile ophthalmic fashion.   A 1 millimeter clear-corneal paracentesis was made at the 1:30 position.  0.5 ml of preservative-free 1% lidocaine was injected into the anterior chamber.  The anterior chamber was filled with Viscoat viscoelastic.  A 2.4 millimeter keratome was used to make a near-clear corneal incision at the 10:30 position.  .  A curvilinear capsulorrhexis was made with a cystotome and capsulorrhexis forceps.  Balanced salt solution was used to hydrodissect and hydrodelineate the nucleus.   Phacoemulsification was then used in stop and chop fashion to remove the lens nucleus and epinucleus.  The remaining cortex was then removed using the irrigation and aspiration handpiece. Provisc was then placed into the capsular bag to distend it for lens placement.  A lens was then injected into the capsular bag.   The remaining viscoelastic was aspirated.   Wounds were hydrated with balanced salt solution.  The anterior chamber was inflated to a physiologic pressure with balanced salt solution.  No wound leaks were noted. Cefuroxime 0.1 ml of a '10mg'$ /ml solution was injected into the anterior chamber for a dose of 1 mg of intracameral antibiotic at the completion of the case.   Timolol and Brimonidine drops were applied to the eye.  The patient was taken to the recovery room in stable condition without complications of anesthesia or surgery.  Carl Dominguez 10/12/2021, 9:18 AM

## 2021-10-12 NOTE — H&P (Signed)
[facility]   Primary Care Physician:  Jerrol Banana., MD Ophthalmologist: Dr. Leandrew Koyanagi  Pre-Procedure History & Physical: HPI:  Carl Dominguez is a 76 y.o. male here for ophthalmic surgery.   Past Medical History:  Diagnosis Date   Diabetes mellitus without complication ([place])    Heart disease    Hyperlipidemia    Hypertension    Myocardial infarction [facility]) 1999    Past Surgical History:  Procedure Laterality Date   CATARACT EXTRACTION W/PHACO Right 03/24/2016   Procedure: CATARACT EXTRACTION PHACO AND INTRAOCULAR LENS PLACEMENT ([place])  Right;  Surgeon: Leandrew Koyanagi, MD;  Location: [place];  Service: Ophthalmology;  Laterality: Right;  Diabetic - oral meds right eye   CHOLECYSTECTOMY  2010   CORONARY ANGIOPLASTY WITH STENT [place]      Prior to Admission medications   Medication Sig Start Date End Date Taking? Authorizing Provider  acetaminophen (TYLENOL) 500 MG tablet Take 500 mg by mouth 2 (two) times daily.   Yes [provider]  aspirin 81 MG tablet Take 81 mg by mouth daily.  04/06/10  Yes [provider]  atorvastatin (LIPITOR) 40 MG tablet TAKE 1 TABLET AT BEDTIME 07/28/21  Yes Jerrol Banana., MD  cetirizine (ZYRTEC) 10 MG tablet TAKE 1 TABLET EVERY DAY 08/24/21  Yes Jerrol Banana., MD  cholecalciferol (VITAMIN D) 1000 UNITS tablet Take 1,000 Units by mouth daily.    Yes [provider]  famotidine (PEPCID) 20 MG tablet Take 20 mg by mouth daily.    Yes [provider]  glipiZIDE (GLUCOTROL) 10 MG tablet TAKE 1 TABLET EVERY DAY 07/28/21  Yes Jerrol Banana., MD  insulin glargine (LANTUS) 100 UNIT/ML Solostar Pen Inject 5 Units into the skin daily. Up to a max of 30 units daily. 08/26/21  Yes Jerrol Banana., MD  losartan (COZAAR) 50 MG tablet Take 1 tablet (50 mg total) by mouth daily. 02/17/21  Yes Jerrol Banana., MD  metFORMIN  (GLUCOPHAGE) 500 MG tablet Take 1 tablet (500 mg total) by mouth 2 (two) times daily with a meal. Patient taking differently: Take 500 mg by mouth daily with breakfast. 06/23/21  Yes Jerrol Banana., MD  metoprolol succinate (TOPROL-XL) 25 MG 24 hr tablet TAKE 1 TABLET EVERY DAY 07/28/21  Yes Jerrol Banana., MD  montelukast (SINGULAIR) 10 MG tablet TAKE 1 TABLET AT BEDTIME 10/07/21  Yes Jerrol Banana., MD  pioglitazone (ACTOS) 45 MG tablet Take 1 tablet (45 mg total) by mouth daily. 02/17/21  Yes Jerrol Banana., MD  Blood Glucose Calibration (TRUE METRIX LEVEL 1) Low SOLN 1 each by In Vitro route daily. 07/28/21   Jerrol Banana., MD  cholestyramine Lucrezia Starch) 4 GM/DOSE powder Take one 4 gram scoop daily.  No other medication 4 hours before or after. Patient not taking: Reported on 10/01/2021 04/04/17   [provider]  Continuous Blood Gluc Sensor (FREESTYLE LIBRE 3 SENSOR) MISC Place 1 sensor on the skin every 14 days. Use to check glucose continuously 09/23/21   Jerrol Banana., MD  Insulin Pen Needle 32G X 4 MM MISC Use with lantus insulin for one injection daily. Replace needle for each injection 09/11/21   Drubel, Ria Comment, PA-C  latanoprost (XALATAN) 0.005 % ophthalmic solution Place 1 drop into both eyes at bedtime.     [provider]  psyllium (METAMUCIL  SMOOTH TEXTURE) 28 % packet Take 1 packet by mouth 2 (two) times daily.  11/06/13   [provider]  timolol (TIMOPTIC) 0.5 % ophthalmic solution 1 drop every morning. 11/06/20   [provider]  TRUE METRIX BLOOD GLUCOSE TEST test strip USE ONE TIME DAILY AS DIRECTED 07/30/21   Jerrol Banana., MD  TRUEplus Lancets 33G MISC USE ONE TIME DAILY AS DIRECTED 07/30/21   Jerrol Banana., MD    Allergies as of 09/02/2021   (No Known Allergies)    History reviewed. No pertinent family history.  Social History   Socioeconomic History   Marital status: Married     Spouse name: Not on file   Number of children: 2   Years of education: Not on file   Highest education level: Some college, no degree  Occupational History   Occupation: retired  Tobacco Use   Smoking status: Former    Types: Cigarettes    Quit date: 03/01/1982    Years since quitting: 39.6   Smokeless tobacco: Never   Tobacco comments:    has been quit for 30 years  Vaping Use   Vaping Use: Never used  Substance and Sexual Activity   Alcohol use: Yes    Comment: occasionally- 1 beer or margarita   Drug use: No   Sexual activity: Not on file  Other Topics Concern   Not on file  Social History Narrative   Not on file   Social Determinants of Health   Financial Resource Strain: Low Risk  (04/09/2021)   Overall Financial Resource Strain (CARDIA)    Difficulty of Paying Living Expenses: Not hard at all  Food Insecurity: No Food Insecurity (04/09/2021)   Hunger Vital Sign    Worried About Running Out of Food in the Last Year: Never true    [place] in the Last Year: Never true  Transportation Needs: No Transportation Needs (04/09/2021)   PRAPARE - Hydrologist (Medical): No    Lack of Transportation (Non-Medical): No  Physical Activity: Sufficiently Active (04/09/2021)   Exercise Vital Sign    Days of Exercise per Week: 5 days    Minutes of Exercise per Session: 40 min  Stress: No Stress Concern Present (04/09/2021)   Speculator    Feeling of Stress : Not at all  Social Connections: [place] (04/09/2021)   Social Connection and Isolation Panel [NHANES]    Frequency of Communication with Friends and Family: More than three times a week    Frequency of Social Gatherings with Friends and Family: More than three times a week    Attends Religious Services: More than 4 times per year    Active Member of [employer] or Organizations: Yes    Attends Music therapist: More  than 4 times per year    Marital Status: Married  Human resources officer Violence: Not At Risk (04/09/2021)   Humiliation, Afraid, Rape, and Kick questionnaire    Fear of Current or Ex-Partner: No    Emotionally Abused: No    Physically Abused: No    Sexually Abused: No    Review of Systems: See HPI, otherwise negative ROS  Physical Exam: Ht '5\' 7"'$  (1.702 m)   Wt 68.9 kg   BMI 23.81 kg/m  General:   Alert,  pleasant and cooperative in NAD Head:  Normocephalic and atraumatic. Lungs:  Clear to auscultation.  Heart:  Regular rate and rhythm.   Impression/Plan: ARPAN ESKELSON is here for ophthalmic surgery.  Risks, benefits, limitations, and alternatives regarding ophthalmic surgery have been reviewed with the patient.  Questions have been answered.  All parties agreeable.   Leandrew Koyanagi, MD  10/12/2021, 8:16 AM

## 2021-10-12 NOTE — Transfer of Care (Signed)
Immediate Anesthesia Transfer of Care Note  Patient: Carl Dominguez  Procedure(s) Performed: CATARACT EXTRACTION PHACO AND INTRAOCULAR LENS PLACEMENT (I[facility]) LEFT 7.86 01:08.4 (Left: Eye)  Patient Location: PACU  Anesthesia Type: MAC  Level of Consciousness: awake, alert  and patient cooperative  Airway and Oxygen Therapy: Patient Spontanous Breathing and Patient connected to supplemental oxygen  Post-op Assessment: Post-op Vital signs reviewed, Patient's Cardiovascular Status Stable, Respiratory Function Stable, Patent Airway and No signs of Nausea or vomiting  Post-op Vital Signs: Reviewed and stable  Complications: No notable events documented.

## 2021-10-13 ENCOUNTER — Encounter: Payer: Self-pay | Admitting: Ophthalmology

## 2021-10-20 DIAGNOSIS — M531 Cervicobrachial syndrome: Secondary | ICD-10-CM | POA: Diagnosis not present

## 2021-10-20 DIAGNOSIS — M9903 Segmental and somatic dysfunction of lumbar region: Secondary | ICD-10-CM | POA: Diagnosis not present

## 2021-10-20 DIAGNOSIS — M9902 Segmental and somatic dysfunction of thoracic region: Secondary | ICD-10-CM | POA: Diagnosis not present

## 2021-10-20 DIAGNOSIS — M546 Pain in thoracic spine: Secondary | ICD-10-CM | POA: Diagnosis not present

## 2021-10-20 DIAGNOSIS — M545 Low back pain, unspecified: Secondary | ICD-10-CM | POA: Diagnosis not present

## 2021-10-20 DIAGNOSIS — M9901 Segmental and somatic dysfunction of cervical region: Secondary | ICD-10-CM | POA: Diagnosis not present

## 2021-11-16 DIAGNOSIS — H524 Presbyopia: Secondary | ICD-10-CM | POA: Diagnosis not present

## 2021-11-16 DIAGNOSIS — Z0100 Encounter for examination of eyes and vision without abnormal findings: Secondary | ICD-10-CM | POA: Diagnosis not present

## 2021-11-16 DIAGNOSIS — H52223 Regular astigmatism, bilateral: Secondary | ICD-10-CM | POA: Diagnosis not present

## 2021-11-17 DIAGNOSIS — M9903 Segmental and somatic dysfunction of lumbar region: Secondary | ICD-10-CM | POA: Diagnosis not present

## 2021-11-17 DIAGNOSIS — M545 Low back pain, unspecified: Secondary | ICD-10-CM | POA: Diagnosis not present

## 2021-11-17 DIAGNOSIS — M9901 Segmental and somatic dysfunction of cervical region: Secondary | ICD-10-CM | POA: Diagnosis not present

## 2021-11-17 DIAGNOSIS — M9902 Segmental and somatic dysfunction of thoracic region: Secondary | ICD-10-CM | POA: Diagnosis not present

## 2021-11-17 DIAGNOSIS — M546 Pain in thoracic spine: Secondary | ICD-10-CM | POA: Diagnosis not present

## 2021-11-17 DIAGNOSIS — M531 Cervicobrachial syndrome: Secondary | ICD-10-CM | POA: Diagnosis not present

## 2021-11-26 DIAGNOSIS — E119 Type 2 diabetes mellitus without complications: Secondary | ICD-10-CM | POA: Diagnosis not present

## 2021-11-26 DIAGNOSIS — I10 Essential (primary) hypertension: Secondary | ICD-10-CM | POA: Diagnosis not present

## 2021-11-26 DIAGNOSIS — K219 Gastro-esophageal reflux disease without esophagitis: Secondary | ICD-10-CM | POA: Diagnosis not present

## 2021-11-26 DIAGNOSIS — I2510 Atherosclerotic heart disease of native coronary artery without angina pectoris: Secondary | ICD-10-CM | POA: Diagnosis not present

## 2021-11-26 DIAGNOSIS — R001 Bradycardia, unspecified: Secondary | ICD-10-CM | POA: Diagnosis not present

## 2021-11-26 DIAGNOSIS — Z955 Presence of coronary angioplasty implant and graft: Secondary | ICD-10-CM | POA: Diagnosis not present

## 2021-11-26 DIAGNOSIS — I779 Disorder of arteries and arterioles, unspecified: Secondary | ICD-10-CM | POA: Diagnosis not present

## 2021-12-08 DIAGNOSIS — M545 Low back pain, unspecified: Secondary | ICD-10-CM | POA: Diagnosis not present

## 2021-12-08 DIAGNOSIS — M531 Cervicobrachial syndrome: Secondary | ICD-10-CM | POA: Diagnosis not present

## 2021-12-08 DIAGNOSIS — M9903 Segmental and somatic dysfunction of lumbar region: Secondary | ICD-10-CM | POA: Diagnosis not present

## 2021-12-08 DIAGNOSIS — M9902 Segmental and somatic dysfunction of thoracic region: Secondary | ICD-10-CM | POA: Diagnosis not present

## 2021-12-08 DIAGNOSIS — M9901 Segmental and somatic dysfunction of cervical region: Secondary | ICD-10-CM | POA: Diagnosis not present

## 2021-12-08 DIAGNOSIS — M546 Pain in thoracic spine: Secondary | ICD-10-CM | POA: Diagnosis not present

## 2021-12-21 ENCOUNTER — Ambulatory Visit: Payer: Medicare HMO | Admitting: Family Medicine

## 2021-12-28 ENCOUNTER — Other Ambulatory Visit: Payer: Self-pay | Admitting: Family Medicine

## 2021-12-28 NOTE — Telephone Encounter (Signed)
[facility] faxed refill request for the following medications:  losartan (COZAAR) 50 MG tablet  pioglitazone (ACTOS) 45 MG tablet  Please advise.

## 2021-12-28 NOTE — Telephone Encounter (Signed)
Medication Refill - Medication: losartan (COZAAR) 50 MG tablet   pioglitazone (ACTOS) 45 MG tablet    Has the patient contacted their pharmacy? Patient was contacted about the refils (Agent: If no, request that the patient contact the pharmacy for the refill. If patient does not wish to contact the pharmacy document the reason why and proceed with request.) (Agent: If yes, when and what did the pharmacy advise?)contact pcp  Preferred Pharmacy (with phone number or street name):  [facility], [place] Phone:  [phone]  Fax:  [phone]     Has the patient been seen for an appointment in the last year OR does the patient have an upcoming appointment? yes  Agent: Please be advised that RX refills may take up to 3 business days. We ask that you follow-up with your pharmacy.

## 2021-12-29 ENCOUNTER — Encounter: Payer: Self-pay | Admitting: Family Medicine

## 2021-12-29 ENCOUNTER — Ambulatory Visit (INDEPENDENT_AMBULATORY_CARE_PROVIDER_SITE_OTHER): Payer: Medicare HMO | Admitting: Family Medicine

## 2021-12-29 VITALS — BP 140/85 | HR 67 | Resp 16 | Wt 159.6 lb

## 2021-12-29 DIAGNOSIS — Z7984 Long term (current) use of oral hypoglycemic drugs: Secondary | ICD-10-CM

## 2021-12-29 DIAGNOSIS — E119 Type 2 diabetes mellitus without complications: Secondary | ICD-10-CM | POA: Diagnosis not present

## 2021-12-29 DIAGNOSIS — E0822 Diabetes mellitus due to underlying condition with diabetic chronic kidney disease: Secondary | ICD-10-CM | POA: Diagnosis not present

## 2021-12-29 DIAGNOSIS — Z794 Long term (current) use of insulin: Secondary | ICD-10-CM | POA: Diagnosis not present

## 2021-12-29 DIAGNOSIS — N183 Chronic kidney disease, stage 3 unspecified: Secondary | ICD-10-CM | POA: Diagnosis not present

## 2021-12-29 LAB — POCT GLYCOSYLATED HEMOGLOBIN (HGB A1C)
Est. average glucose Bld gHb Est-mCnc: 177
Hemoglobin A1C: 7.8 % — AB (ref 4.0–5.6)

## 2021-12-29 MED ORDER — METFORMIN HCL 500 MG PO TABS: 500.0000 mg | ORAL_TABLET | Freq: Every day | ORAL | Status: DC

## 2021-12-29 MED ORDER — PIOGLITAZONE HCL 45 MG PO TABS: 45.0000 mg | ORAL_TABLET | Freq: Every day | ORAL | 1 refills | Status: DC

## 2021-12-29 MED ORDER — LOSARTAN POTASSIUM 50 MG PO TABS: 50.0000 mg | ORAL_TABLET | Freq: Every day | ORAL | 1 refills | Status: DC

## 2021-12-29 NOTE — Assessment & Plan Note (Signed)
Stable Hemoglobin A1c is improved from 9.7-7.8 Discussed that given patient's age and report of hypoglycemia, I do not recommend increasing Lantus or add any additional doses of antihyperglycemia agents Discussed the risk of hypoglycemia Patient initially disappointment with A1c not being less than 7, recommended that given his episodes of hypoglycemia that his goal is reasonable to be less than 8 for hemoglobin A1c Patient is agreeable to urine microalbumin with creatinine ratio He will continue glipizide 10 mg daily, metformin 500 mg daily, Lantus 5 units daily He will continue to check fasting a.m. blood glucose Recommended checking 1-2 postprandial glucoses during the week so that he can get an idea of how his food affects his blood sugar He will follow up in 3 months

## 2021-12-29 NOTE — Progress Notes (Signed)
I,Carl Dominguez,acting as a scribe for [employer], MD.,have documented all relevant documentation on the behalf of Carl Foster, MD,as directed by  Carl Foster, MD while in the presence of Carl Foster, MD.   Established patient visit   Patient: Carl Dominguez   DOB: 03/16/1945   76 y.o. Male  MRN: [id number] Visit Date: 12/29/2021  Today's healthcare provider: [name], MD   Chief Complaint  Patient presents with   Follow-Up DM   Subjective    HPI  Diabetes Mellitus Type II, Follow-up  Lab Results  Component Value Date   HGBA1C 7.8 (A) 12/29/2021   HGBA1C 9.2 (A) 08/26/2021   HGBA1C 7.1 (H) 05/20/2021   Wt Readings from Last 3 Encounters:  12/29/21 159 lb 9.6 oz (72.4 kg)  10/12/21 152 lb (68.9 kg)  09/23/21 152 lb (68.9 kg)   Last seen for diabetes 3 months ago.  Management since then includes Metformin 500 mg daily in the morning. Continue Actos and Lantus He reports excellent compliance with treatment. He is not having side effects.  Symptoms: No fatigue No foot ulcerations  No appetite changes No nausea  Yes paresthesia of the feet  No polydipsia  No polyuria No visual disturbances   No vomiting     Home blood sugar records:  100 or less, only had a 160  Episodes of hypoglycemia? Yes to 62-sweaty   Current insulin regiment: Lantus 5 units in the morning. Most Recent Eye Exam: 10/2021 Current exercise: cardiovascular workout on exercise equipment, walking, and yard work Current diet habits: well balanced  BP Readings from Last 3 Encounters:  12/29/21 (!) 140/85  10/12/21 108/66  09/23/21 129/70    Pertinent Labs: Lab Results  Component Value Date   CHOL 126 09/15/2020   HDL 50 09/15/2020   LDLCALC 53 09/15/2020   TRIG 134 09/15/2020   CHOLHDL 2.5 09/15/2020   Lab Results  Component Value Date   NA 138 08/11/2021   K 5.2 08/11/2021   CREATININE 1.21 08/11/2021   EGFR 61  08/11/2021   MICROALBUR 20 11/23/2017     ---------------------------------------------------------------------------------------------------   Medications: Outpatient Medications Prior to Visit  Medication Sig   acetaminophen (TYLENOL) 500 MG tablet Take 500 mg by mouth 2 (two) times daily.   aspirin 81 MG tablet Take 81 mg by mouth daily.    atorvastatin (LIPITOR) 40 MG tablet TAKE 1 TABLET AT BEDTIME   Blood Glucose Calibration (TRUE METRIX LEVEL 1) Low SOLN 1 each by In Vitro route daily.   cetirizine (ZYRTEC) 10 MG tablet TAKE 1 TABLET EVERY DAY   cholecalciferol (VITAMIN D) 1000 UNITS tablet Take 1,000 Units by mouth daily.    cholestyramine (QUESTRAN) 4 GM/DOSE powder    famotidine (PEPCID) 20 MG tablet Take 20 mg by mouth daily.    glipiZIDE (GLUCOTROL) 10 MG tablet TAKE 1 TABLET EVERY DAY   insulin glargine (LANTUS) 100 UNIT/ML Solostar Pen Inject 5 Units into the skin daily. Up to a max of 30 units daily.   Insulin Pen Needle 32G X 4 MM MISC Use with lantus insulin for one injection daily. Replace needle for each injection   latanoprost (XALATAN) 0.005 % ophthalmic solution Place 1 drop into both eyes at bedtime.    losartan (COZAAR) 50 MG tablet Take 1 tablet (50 mg total) by mouth daily.   metoprolol succinate (TOPROL-XL) 25 MG 24 hr tablet TAKE 1 TABLET EVERY DAY   montelukast (SINGULAIR) 10 MG tablet TAKE 1  TABLET AT BEDTIME   pioglitazone (ACTOS) 45 MG tablet Take 1 tablet (45 mg total) by mouth daily.   psyllium (METAMUCIL SMOOTH TEXTURE) 28 % packet Take 1 packet by mouth 2 (two) times daily.    timolol (TIMOPTIC) 0.5 % ophthalmic solution 1 drop every morning.   TRUE METRIX BLOOD GLUCOSE TEST test strip USE ONE TIME DAILY AS DIRECTED   TRUEplus Lancets 33G MISC USE ONE TIME DAILY AS DIRECTED   [DISCONTINUED] metFORMIN (GLUCOPHAGE) 500 MG tablet Take 1 tablet (500 mg total) by mouth 2 (two) times daily with a meal. (Patient taking differently: Take 500 mg by mouth daily  with breakfast.)   [DISCONTINUED] Continuous Blood Gluc Sensor (FREESTYLE LIBRE 3 SENSOR) MISC Place 1 sensor on the skin every 14 days. Use to check glucose continuously   No facility-administered medications prior to visit.    Review of Systems     Objective    BP (!) 140/85 (BP Location: Left Arm, Patient Position: Sitting, Cuff Size: Large)   Pulse 67   Resp 16   Wt 159 lb 9.6 oz (72.4 kg)   BMI 25.00 kg/m    Physical Exam Vitals reviewed.  Constitutional:      General: He is not in acute distress.    Appearance: Normal appearance. He is not ill-appearing, toxic-appearing or diaphoretic.  Eyes:     Conjunctiva/sclera: Conjunctivae normal.  Cardiovascular:     Rate and Rhythm: Normal rate and regular rhythm.     Pulses: Normal pulses.     Heart sounds: No murmur heard.    No friction rub. No gallop.  Pulmonary:     Effort: Pulmonary effort is normal. No respiratory distress.     Breath sounds: Normal breath sounds. No stridor. No wheezing, rhonchi or rales.  Abdominal:     General: Bowel sounds are normal. There is no distension.     Palpations: Abdomen is soft.     Tenderness: There is no abdominal tenderness.  Musculoskeletal:     Right lower leg: No edema.     Left lower leg: No edema.  Skin:    Findings: No erythema or rash.  Neurological:     Mental Status: He is alert and oriented to person, place, and time.       Results for orders placed or performed in visit on 12/29/21  POCT glycosylated hemoglobin (Hb A1C)  Result Value Ref Range   Hemoglobin A1C 7.8 (A) 4.0 - 5.6 %   Est. average glucose Bld gHb Est-mCnc 177     Assessment & Plan     Problem List Items Addressed This Visit       Endocrine   Diabetes mellitus ([place]) - Primary    Stable Hemoglobin A1c is improved from 9.7-7.8 Discussed that given patient's age and report of hypoglycemia, I do not recommend increasing Lantus or add any additional doses of antihyperglycemia agents Discussed  the risk of hypoglycemia Patient initially disappointment with A1c not being less than 7, recommended that given his episodes of hypoglycemia that his goal is reasonable to be less than 8 for hemoglobin A1c Patient is agreeable to urine microalbumin with creatinine ratio He will continue glipizide 10 mg daily, metformin 500 mg daily, Lantus 5 units daily He will continue to check fasting a.m. blood glucose Recommended checking 1-2 postprandial glucoses during the week so that he can get an idea of how his food affects his blood sugar He will follow up in 3 months  Relevant Medications   metFORMIN (GLUCOPHAGE) 500 MG tablet   Other Relevant Orders   POCT glycosylated hemoglobin (Hb A1C) (Completed)   Urine Microalbumin w/creat. ratio     Return in about 3 months (around 03/31/2022).     I, Carl Foster, MD, have reviewed all documentation for this visit. The documentation on 12/29/21 for the exam, diagnosis, procedures, and orders are all accurate and complete.  Portions of this information were initially documented by the CMA and reviewed by me for thoroughness and accuracy.      Carl Foster, MD  [facility] [phone] (phone) [phone] (fax)  [place]

## 2021-12-29 NOTE — Patient Instructions (Signed)
It was a pleasure to see you today!  Thank you for choosing [facility] for your primary care.   Carl Dominguez was seen for diabetes follow up.   Our plans for today were: Continue current diabetes medications  Please check blood sugar 2 hours after your largest meal 1-2 times per week to gain an understanding of how your meals affect your blood sugar. Goal is to have a measurement less than 180 after meals  Your A1c is improved from 9.2 to 7.8 today, it is reasonable for your new A1c goal to be less than 8  Please monitor for further episodes of low blood sugars, less than 80 and notify our office so that we can adjust your medications ASAP   To keep you healthy, please keep in mind the following health maintenance items that you are due for:   Diabetes Eye Exam   COVID vaccine    You should return to our clinic in 3 for months for diabetes follow up.   Best Wishes,   Dr. Quentin Cornwall

## 2021-12-29 NOTE — Telephone Encounter (Signed)
Patient is scheduled to see you today

## 2021-12-30 LAB — SPECIMEN STATUS REPORT

## 2021-12-30 LAB — MICROALBUMIN / CREATININE URINE RATIO
Creatinine, Urine: 86.2 mg/dL
Microalb/Creat Ratio: <3 mg/g creat (ref 0–29)
Microalbumin, Urine: <3 ug/mL

## 2022-01-05 DIAGNOSIS — M531 Cervicobrachial syndrome: Secondary | ICD-10-CM | POA: Diagnosis not present

## 2022-01-05 DIAGNOSIS — M9903 Segmental and somatic dysfunction of lumbar region: Secondary | ICD-10-CM | POA: Diagnosis not present

## 2022-01-05 DIAGNOSIS — M545 Low back pain, unspecified: Secondary | ICD-10-CM | POA: Diagnosis not present

## 2022-01-05 DIAGNOSIS — M546 Pain in thoracic spine: Secondary | ICD-10-CM | POA: Diagnosis not present

## 2022-01-05 DIAGNOSIS — M9902 Segmental and somatic dysfunction of thoracic region: Secondary | ICD-10-CM | POA: Diagnosis not present

## 2022-01-05 DIAGNOSIS — M9901 Segmental and somatic dysfunction of cervical region: Secondary | ICD-10-CM | POA: Diagnosis not present

## 2022-02-18 DIAGNOSIS — L821 Other seborrheic keratosis: Secondary | ICD-10-CM | POA: Diagnosis not present

## 2022-02-18 DIAGNOSIS — L814 Other melanin hyperpigmentation: Secondary | ICD-10-CM | POA: Diagnosis not present

## 2022-02-18 DIAGNOSIS — L570 Actinic keratosis: Secondary | ICD-10-CM | POA: Diagnosis not present

## 2022-03-16 DIAGNOSIS — M9902 Segmental and somatic dysfunction of thoracic region: Secondary | ICD-10-CM | POA: Diagnosis not present

## 2022-03-16 DIAGNOSIS — M546 Pain in thoracic spine: Secondary | ICD-10-CM | POA: Diagnosis not present

## 2022-03-16 DIAGNOSIS — M9903 Segmental and somatic dysfunction of lumbar region: Secondary | ICD-10-CM | POA: Diagnosis not present

## 2022-03-16 DIAGNOSIS — M531 Cervicobrachial syndrome: Secondary | ICD-10-CM | POA: Diagnosis not present

## 2022-03-16 DIAGNOSIS — M9901 Segmental and somatic dysfunction of cervical region: Secondary | ICD-10-CM | POA: Diagnosis not present

## 2022-03-16 DIAGNOSIS — M5137 Other intervertebral disc degeneration, lumbosacral region: Secondary | ICD-10-CM | POA: Diagnosis not present

## 2022-03-18 ENCOUNTER — Telehealth: Payer: Self-pay | Admitting: Family Medicine

## 2022-03-18 ENCOUNTER — Other Ambulatory Visit: Payer: Self-pay

## 2022-03-18 DIAGNOSIS — I10 Essential (primary) hypertension: Secondary | ICD-10-CM

## 2022-03-18 DIAGNOSIS — E119 Type 2 diabetes mellitus without complications: Secondary | ICD-10-CM

## 2022-03-18 MED ORDER — METOPROLOL SUCCINATE ER 25 MG PO TB24: 25.0000 mg | ORAL_TABLET | Freq: Every day | ORAL | 1 refills | Status: DC

## 2022-03-18 MED ORDER — GLIPIZIDE 10 MG PO TABS: 10.0000 mg | ORAL_TABLET | Freq: Every day | ORAL | 1 refills | Status: DC

## 2022-03-18 MED ORDER — ATORVASTATIN CALCIUM 40 MG PO TABS: 40.0000 mg | ORAL_TABLET | Freq: Every day | ORAL | 1 refills | Status: DC

## 2022-03-18 NOTE — Telephone Encounter (Signed)
Refill completed.

## 2022-03-18 NOTE — Telephone Encounter (Addendum)
[facility] faxed refill request for the following medications:  metoprolol succinate (TOPROL-XL) 25 MG 24 hr tablet   glipiZIDE (GLUCOTROL) 10 MG tablet   atorvastatin (LIPITOR) 40 MG tablet     Please advise.

## 2022-04-02 NOTE — Progress Notes (Unsigned)
I,Carl Dominguez,acting as a scribe for [employer], MD.,have documented all relevant documentation on the behalf of Carl Foster, MD,as directed by  Carl Foster, MD while in the presence of Carl Foster, MD.   Established patient visit   Patient: Carl Dominguez   DOB: 22-Dec-1945   77 y.o. Male  MRN: [id number]  Visit Date: 04/05/2022  Today's healthcare provider: [name], MD   Chief Complaint  Patient presents with   Follow-up   Subjective    HPI  Diabetes Mellitus Type II, Follow-up  Lab Results  Component Value Date   HGBA1C 8.2 (A) 04/05/2022   HGBA1C 7.8 (A) 12/29/2021   HGBA1C 9.2 (A) 08/26/2021   Wt Readings from Last 3 Encounters:  04/05/22 163 lb 1.6 oz (74 kg)  12/29/21 159 lb 9.6 oz (72.4 kg)  10/12/21 152 lb (68.9 kg)   Last seen for diabetes 3 months ago.  Management since then includes continue glipizide 10 mg daily, metformin 500 mg daily, Lantus 5 units daily and actos '45mg'$  . He reports excellent compliance with treatment. Patient states that he has not been exercising due to colder temperatures He denies any changes to his diet, states he will have a pack of nuts for snack, 2 oatmeal cookies (not with the cream filling), four pack of crackers Breakfast consists of two eggs, bacon and grits  He usually drinks a glass of milk with breakfast along with coffee, diet mountain dew with his snacks (drinks 1/2 mountain diet mountain dew) Patient states he usually does not like drinking water    Symptoms: No fatigue No foot ulcerations  No appetite changes No nausea  No paresthesia of the feet  No polydipsia  No polyuria No visual disturbances   No vomiting     Home blood sugar records:  90-120's  Episodes of hypoglycemia? No    Current insulin regiment: Lantus 5 units daily  Pertinent Labs: Lab Results  Component Value Date   CHOL 126 09/15/2020   HDL 50 09/15/2020   LDLCALC  53 09/15/2020   TRIG 134 09/15/2020   CHOLHDL 2.5 09/15/2020   Lab Results  Component Value Date   NA 138 08/11/2021   K 5.2 08/11/2021   CREATININE 1.21 08/11/2021   EGFR 61 08/11/2021   LABMICR <3.0 12/29/2021   MICRALBCREAT <3 12/29/2021     ---------------------------------------------------------------------------------------------------   Medications: Outpatient Medications Prior to Visit  Medication Sig   acetaminophen (TYLENOL) 500 MG tablet Take 500 mg by mouth 2 (two) times daily.   aspirin 81 MG tablet Take 81 mg by mouth daily.    atorvastatin (LIPITOR) 40 MG tablet Take 1 tablet (40 mg total) by mouth at bedtime.   Blood Glucose Calibration (TRUE METRIX LEVEL 1) Low SOLN 1 each by In Vitro route daily.   cetirizine (ZYRTEC) 10 MG tablet TAKE 1 TABLET EVERY DAY   cholecalciferol (VITAMIN D) 1000 UNITS tablet Take 1,000 Units by mouth daily.    famotidine (PEPCID) 20 MG tablet Take 20 mg by mouth daily.    glipiZIDE (GLUCOTROL) 10 MG tablet Take 1 tablet (10 mg total) by mouth daily.   Insulin Pen Needle 32G X 4 MM MISC Use with lantus insulin for one injection daily. Replace needle for each injection   latanoprost (XALATAN) 0.005 % ophthalmic solution Place 1 drop into both eyes at bedtime.    losartan (COZAAR) 50 MG tablet Take 1 tablet (50 mg total) by mouth daily.   metFORMIN (GLUCOPHAGE)  500 MG tablet Take 1 tablet (500 mg total) by mouth daily with breakfast.   metoprolol succinate (TOPROL-XL) 25 MG 24 hr tablet Take 1 tablet (25 mg total) by mouth daily.   montelukast (SINGULAIR) 10 MG tablet TAKE 1 TABLET AT BEDTIME   pioglitazone (ACTOS) 45 MG tablet Take 1 tablet (45 mg total) by mouth daily.   psyllium (METAMUCIL SMOOTH TEXTURE) 28 % packet Take 1 packet by mouth 2 (two) times daily.    timolol (TIMOPTIC) 0.5 % ophthalmic solution 1 drop every morning.   TRUE METRIX BLOOD GLUCOSE TEST test strip USE ONE TIME DAILY AS DIRECTED   TRUEplus Lancets 33G MISC USE  ONE TIME DAILY AS DIRECTED   [DISCONTINUED] insulin glargine (LANTUS) 100 UNIT/ML Solostar Pen Inject 5 Units into the skin daily. Up to a max of 30 units daily.   cholestyramine (QUESTRAN) 4 GM/DOSE powder    No facility-administered medications prior to visit.    Review of Systems     Objective    BP 123/62 (BP Location: Right Arm, Patient Position: Sitting, Cuff Size: Normal)   Pulse 61   Resp 16   Ht '5\' 7"'$  (1.702 m)   Wt 163 lb 1.6 oz (74 kg)   BMI 25.55 kg/m    Physical Exam Vitals and nursing note reviewed.  Constitutional:      General: He is not in acute distress.    Appearance: Normal appearance. He is not ill-appearing, toxic-appearing or diaphoretic.  Eyes:     General: No scleral icterus.    Conjunctiva/sclera: Conjunctivae normal.  Cardiovascular:     Rate and Rhythm: Normal rate and regular rhythm.     Pulses: Normal pulses.     Heart sounds: Normal heart sounds. No murmur heard.    No friction rub. No gallop.  Pulmonary:     Effort: Pulmonary effort is normal. No respiratory distress.     Breath sounds: Normal breath sounds. No stridor. No wheezing, rhonchi or rales.  Abdominal:     General: Bowel sounds are normal. There is no distension.     Palpations: Abdomen is soft.     Tenderness: There is no abdominal tenderness.  Musculoskeletal:        General: No swelling or signs of injury. Normal range of motion.     Right lower leg: No edema.     Left lower leg: No edema.  Skin:    Findings: No erythema or rash.  Neurological:     Mental Status: He is alert and oriented to person, place, and time.     Gait: Gait normal.       Results for orders placed or performed in visit on 04/05/22  POCT glycosylated hemoglobin (Hb A1C)  Result Value Ref Range   Hemoglobin A1C 8.2 (A) 4.0 - 5.6 %   PT 189     Assessment & Plan     Problem List Items Addressed This Visit       Endocrine   Diabetes mellitus ([place]) - Primary    A1c repeated and increased  8.2 from 7.8 Patient has not been as physically active  Discussed recommendations to consume well balanced meals  Will continue metformin '500mg'$  daily, actos '45mg'$  daily, glipizide 10 mg  Increase lantus to 7 units daily, counseled patient to return to care sooner if BG elevated above 200 or below 70, patient voiced understanding  Otherwise will follow up in 3 months for repeat A1c        Relevant  Medications   insulin glargine (LANTUS) 100 UNIT/ML Solostar Pen   Other Relevant Orders   POCT glycosylated hemoglobin (Hb A1C) (Completed)     Return in about 3 months (around 07/04/2022) for diabetes.        The entirety of the information documented in the History of Present Illness, Review of Systems and Physical Exam were personally obtained by me. Portions of this information were initially documented by Lyndel Pleasure, CMA and reviewed by me for thoroughness and accuracy.Carl Foster, MD     Carl Foster, MD  [facility] [phone] (phone) [phone] (fax)  [place]

## 2022-04-05 ENCOUNTER — Encounter: Payer: Self-pay | Admitting: Family Medicine

## 2022-04-05 ENCOUNTER — Ambulatory Visit (INDEPENDENT_AMBULATORY_CARE_PROVIDER_SITE_OTHER): Payer: Medicare HMO | Admitting: Family Medicine

## 2022-04-05 VITALS — BP 123/62 | HR 61 | Resp 16 | Ht 67.0 in | Wt 163.1 lb

## 2022-04-05 DIAGNOSIS — E119 Type 2 diabetes mellitus without complications: Secondary | ICD-10-CM

## 2022-04-05 LAB — POCT GLYCOSYLATED HEMOGLOBIN (HGB A1C)
Hemoglobin A1C: 8.2 % — AB (ref 4.0–5.6)
PT: 189

## 2022-04-05 MED ORDER — INSULIN GLARGINE 100 UNIT/ML SOLOSTAR PEN
7.0000 [IU] | PEN_INJECTOR | Freq: Every day | SUBCUTANEOUS | 6 refills | Status: DC
Start: 2022-04-05 — End: 2022-08-09

## 2022-04-05 NOTE — Assessment & Plan Note (Signed)
A1c repeated and increased 8.2 from 7.8 Patient has not been as physically active  Discussed recommendations to consume well balanced meals  Will continue metformin '500mg'$  daily, actos '45mg'$  daily, glipizide 10 mg  Increase lantus to 7 units daily, counseled patient to return to care sooner if BG elevated above 200 or below 70, patient voiced understanding  Otherwise will follow up in 3 months for repeat A1c

## 2022-04-05 NOTE — Patient Instructions (Addendum)
Please add two units to your Lantus regimen for a total of 7 units of Lantus daily    Please plan to follow up in 3 months for diabetes   Please return sooner if your blood glucose is elevated above 200 consistently or if you have low blood sugars less than 70 or if you have dizziness, sweating, confusion or nausea    Dr. Quentin Cornwall

## 2022-04-07 DIAGNOSIS — Z86010 Personal history of colonic polyps: Secondary | ICD-10-CM | POA: Diagnosis not present

## 2022-04-07 DIAGNOSIS — K219 Gastro-esophageal reflux disease without esophagitis: Secondary | ICD-10-CM | POA: Diagnosis not present

## 2022-04-09 ENCOUNTER — Other Ambulatory Visit: Payer: Self-pay | Admitting: Physician Assistant

## 2022-04-09 ENCOUNTER — Other Ambulatory Visit: Payer: Self-pay | Admitting: Family Medicine

## 2022-04-09 DIAGNOSIS — E119 Type 2 diabetes mellitus without complications: Secondary | ICD-10-CM

## 2022-04-09 NOTE — Telephone Encounter (Signed)
Medication Refill - Medication: Insulin Pen Needle 32G X 4 MM MISC   Has the patient contacted their pharmacy? Yes.    Per pharmacy they are not showing on their end that the pt has 1 refill left, pt was referred to PCP.  Pt only has two pen needles left  Preferred Pharmacy (with phone number or street name): [facility] [id number] [address], [place] [facility] Phone: [phone]  Fax: [phone]   Has the patient been seen for an appointment in the last year OR does the patient have an upcoming appointment? Yes.    Agent: Please be advised that RX refills may take up to 3 business days. We ask that you follow-up with your pharmacy.

## 2022-04-09 NOTE — Telephone Encounter (Signed)
Requested Prescriptions  Pending Prescriptions Disp Refills   BD PEN NEEDLE NANO 2ND GEN 32G X 4 MM MISC [Pharmacy Med Name: B-D NANO 2ND GEN PEN NDL [id number]] 100 each 1    Sig: USE WITH LANTUS INSULIN FOR 1 INJECTION DAILY. REPLACE NEEDLE FOR [facility] INJECTION     Endocrinology: Diabetes - Testing Supplies Passed - 04/09/2022 11:39 AM      Passed - Valid encounter within last 12 months    Recent Outpatient Visits           4 days ago Type 2 diabetes mellitus without complication, without long-term current use of insulin ([place])   [place] Simmons-Robinson, [place], MD   3 months ago Diabetes mellitus due to underlying condition with stage 3 chronic kidney disease, with long-term current use of insulin, unspecified whether stage 3a or 3b CKD ([place])   [place] Simmons-Robinson, [place], MD   6 months ago Diabetes mellitus due to underlying condition with stage 3 chronic kidney disease, with long-term current use of insulin, unspecified whether stage 3a or 3b CKD ([place])   [place] Eulas Post, MD   7 months ago Type 2 diabetes mellitus without complication, without long-term current use of insulin ([place])   [place] Eulas Post, MD   8 months ago Rectal bleeding   [place] Eulas Post, MD       Future Appointments             In 2 months Simmons-Robinson, Riki Sheer, MD [facility]

## 2022-04-09 NOTE — Telephone Encounter (Signed)
Duplicate request- has already been filled today Requested Prescriptions  Pending Prescriptions Disp Refills   Insulin Pen Needle 32G X 4 MM MISC 90 each 1    Sig: Use with lantus insulin for one injection daily. Replace needle for each injection     Endocrinology: Diabetes - Testing Supplies Passed - 04/09/2022 12:29 PM      Passed - Valid encounter within last 12 months    Recent Outpatient Visits           4 days ago Type 2 diabetes mellitus without complication, without long-term current use of insulin ([place])   [place] Simmons-Robinson, [place], MD   3 months ago Diabetes mellitus due to underlying condition with stage 3 chronic kidney disease, with long-term current use of insulin, unspecified whether stage 3a or 3b CKD ([place])   [place] Simmons-Robinson, [place], MD   6 months ago Diabetes mellitus due to underlying condition with stage 3 chronic kidney disease, with long-term current use of insulin, unspecified whether stage 3a or 3b CKD ([place])   [place] Eulas Post, MD   7 months ago Type 2 diabetes mellitus without complication, without long-term current use of insulin ([place])   [place] Eulas Post, MD   8 months ago Rectal bleeding   [place] Eulas Post, MD       Future Appointments             In 2 months Simmons-Robinson, Riki Sheer, MD [facility]

## 2022-04-13 ENCOUNTER — Ambulatory Visit (INDEPENDENT_AMBULATORY_CARE_PROVIDER_SITE_OTHER): Payer: Medicare HMO

## 2022-04-13 VITALS — Ht 67.0 in | Wt 163.0 lb

## 2022-04-13 DIAGNOSIS — Z0000 Encounter for general adult medical examination without abnormal findings: Secondary | ICD-10-CM | POA: Diagnosis not present

## 2022-04-13 NOTE — Progress Notes (Signed)
I connected with  Carl Dominguez on 04/13/22 by a audio enabled telemedicine application and verified that I am speaking with the correct person using two identifiers.  Patient Location: Home  Provider Location: Home Office  I discussed the limitations of evaluation and management by telemedicine. The patient expressed understanding and agreed to proceed.  Subjective:   Carl Dominguez is a 77 y.o. male who presents for Medicare Annual/Subsequent preventive examination.  Review of Systems        Objective:    Today's Vitals   04/13/22 1303  Weight: 163 lb (73.9 kg)  Height: 5' 7"$  (1.702 m)   Body mass index is 25.53 kg/m.     04/13/2022    1:19 PM 10/12/2021    8:12 AM 04/09/2021    1:09 PM 04/03/2020    9:53 AM 04/02/2019    9:38 AM 03/22/2018    9:34 AM 03/04/2017   10:22 AM  Advanced Directives  Does Patient Have a Medical Advance Directive? Yes Yes Yes Yes No No Yes  Type of Corporate treasurer of [place];Living will Healthcare Power of [place];Living will   Living will  Does patient want to make changes to medical advance directive?  No - Patient declined Yes (Inpatient - patient defers changing a medical advance directive and declines information at this time)      Copy of [place] in Chart?  Yes - validated most recent copy scanned in chart (See row information) Yes - validated most recent copy scanned in chart (See row information) No - copy requested     Would patient like information on creating a medical advance directive?     No - Patient declined No - Patient declined     Current Medications (verified) Outpatient Encounter Medications as of 04/13/2022  Medication Sig   acetaminophen (TYLENOL) 500 MG tablet Take 500 mg by mouth 2 (two) times daily.   aspirin 81 MG tablet Take 81 mg by mouth daily.    atorvastatin (LIPITOR) 40 MG tablet Take 1 tablet (40 mg total) by mouth at bedtime.   Blood Glucose  Calibration (TRUE METRIX LEVEL 1) Low SOLN 1 each by In Vitro route daily.   cetirizine (ZYRTEC) 10 MG tablet TAKE 1 TABLET EVERY DAY   cholecalciferol (VITAMIN D) 1000 UNITS tablet Take 1,000 Units by mouth daily.    cholestyramine (QUESTRAN) 4 GM/DOSE powder    famotidine (PEPCID) 20 MG tablet Take 20 mg by mouth daily.    glipiZIDE (GLUCOTROL) 10 MG tablet Take 1 tablet (10 mg total) by mouth daily.   insulin glargine (LANTUS) 100 UNIT/ML Solostar Pen Inject 7 Units into the skin daily. Up to a max of 30 units daily.   Insulin Pen Needle (BD PEN NEEDLE NANO 2ND GEN) 32G X 4 MM MISC USE WITH LANTUS INSULIN FOR 1 INJECTION DAILY. REPLACE NEEDLE FOR EACH INJECTION   latanoprost (XALATAN) 0.005 % ophthalmic solution Place 1 drop into both eyes at bedtime.    losartan (COZAAR) 50 MG tablet Take 1 tablet (50 mg total) by mouth daily.   metFORMIN (GLUCOPHAGE) 500 MG tablet Take 1 tablet (500 mg total) by mouth daily with breakfast.   metoprolol succinate (TOPROL-XL) 25 MG 24 hr tablet Take 1 tablet (25 mg total) by mouth daily.   montelukast (SINGULAIR) 10 MG tablet TAKE 1 TABLET AT BEDTIME   pioglitazone (ACTOS) 45 MG tablet Take 1 tablet (45 mg total) by mouth daily.  psyllium (METAMUCIL SMOOTH TEXTURE) 28 % packet Take 1 packet by mouth 2 (two) times daily.    timolol (TIMOPTIC) 0.5 % ophthalmic solution 1 drop every morning.   TRUE METRIX BLOOD GLUCOSE TEST test strip USE ONE TIME DAILY AS DIRECTED   TRUEplus Lancets 33G MISC USE ONE TIME DAILY AS DIRECTED   No facility-administered encounter medications on file as of 04/13/2022.    Allergies (verified) Patient has no known allergies.   History: Past Medical History:  Diagnosis Date   Diabetes mellitus without complication ([place])    Heart disease    Hyperlipidemia    Hypertension    Myocardial infarction [facility]) 1999   Past Surgical History:  Procedure Laterality Date   CATARACT EXTRACTION W/PHACO Right 03/24/2016   Procedure:  CATARACT EXTRACTION PHACO AND INTRAOCULAR LENS PLACEMENT ([place])  Right;  Surgeon: Leandrew Koyanagi, MD;  Location: [place];  Service: Ophthalmology;  Laterality: Right;  Diabetic - oral meds right eye   CATARACT EXTRACTION W/PHACO Left 10/12/2021   Procedure: CATARACT EXTRACTION PHACO AND INTRAOCULAR LENS PLACEMENT ([facility]) LEFT 7.86 01:08.4;  Surgeon: Leandrew Koyanagi, MD;  Location: [place];  Service: Ophthalmology;  Laterality: Left;   CHOLECYSTECTOMY  2010   CORONARY ANGIOPLASTY WITH STENT PLACEMENT  1999   HERNIA REPAIR     History reviewed. No pertinent family history. Social History   Socioeconomic History   Marital status: Married    Spouse name: Not on file   Number of children: 2   Years of education: Not on file   Highest education level: Some college, no degree  Occupational History   Occupation: retired  Tobacco Use   Smoking status: Former    Types: Cigarettes    Quit date: 03/01/1982    Years since quitting: 40.1   Smokeless tobacco: Never   Tobacco comments:    has been quit for 30 years  Vaping Use   Vaping Use: Never used  Substance and Sexual Activity   Alcohol use: Yes    Comment: occasionally- 1 beer or margarita   Drug use: No   Sexual activity: Not on file  Other Topics Concern   Not on file  Social History Narrative   Not on file   Social Determinants of Health   Financial Resource Strain: Low Risk  (04/13/2022)   Overall Financial Resource Strain (CARDIA)    Difficulty of Paying Living Expenses: Not hard at all  Food Insecurity: No Food Insecurity (04/13/2022)   Hunger Vital Sign    Worried About Running Out of Food in the Last Year: Never true    [place] in the Last Year: Never true  Transportation Needs: Unknown (04/13/2022)   PRAPARE - Hydrologist (Medical): Not on file    Lack of Transportation (Non-Medical): No  Physical Activity: Insufficiently Active (04/13/2022)   Exercise  Vital Sign    Days of Exercise per Week: 3 days    Minutes of Exercise per Session: 30 min  Stress: No Stress Concern Present (04/13/2022)   [place]    Feeling of Stress : Not at all  Social Connections: [place] (04/13/2022)   Social Connection and Isolation Panel [NHANES]    Frequency of Communication with Friends and Family: More than three times a week    Frequency of Social Gatherings with Friends and Family: More than three times a week    Attends Religious Services: More than 4  times per year    Active Member of Clubs or Organizations: Yes    Attends Archivist Meetings: More than 4 times per year    Marital Status: Married    Tobacco Counseling Counseling given: Not Answered Tobacco comments: has been quit for 30 years   Clinical Intake:  Pre-visit preparation completed: Yes  Pain : No/denies pain     BMI - recorded: 25.53 Nutritional Status: BMI 25 -29 Overweight Nutritional Risks: Non-healing wound Diabetes: Yes CBG done?: No (pt BS 98) Did pt. bring in CBG monitor from home?: No  How often do you need to have someone help you when you read instructions, pamphlets, or other written materials from your doctor or pharmacy?: 1 - Never  Diabetic?yes  Interpreter Needed?: No  Information entered by :: B.Maraki Macquarrie,LPN   Activities of Daily Living    04/13/2022    1:19 PM 04/05/2022    8:25 AM  In your present state of health, do you have any difficulty performing the following activities:  Hearing? 1 1  Vision? 0 0  Difficulty concentrating or making decisions? 0 0  Walking or climbing stairs? 0 0  Dressing or bathing? 0 0  Doing errands, shopping? 0 0  Preparing Food and eating ? N   Using the Toilet? N   In the past six months, have you accidently leaked urine? N   Do you have problems with loss of bowel control? N   Managing your Medications? N   Managing your  Finances? N   Housekeeping or managing your Housekeeping? N     Patient Care Team: [name], MD as PCP - General (Family Medicine) Leandrew Koyanagi, MD as Referring Physician (Ophthalmology) Richmond Campbell, MD as Consulting Physician (Gastroenterology) Yolonda Kida, MD as Consulting Physician (Cardiology)  Indicate any recent Medical Services you may have received from other than [facility] providers in the past year (date may be approximate).     Assessment:   This is a routine wellness examination for [place].  Hearing/Vision screen Hearing Screening - Comments:: Adequate hearing w/hearing aides Vision Screening - Comments:: Adequate vision w/glasses Dr Mordecai Rasmussen [place] [facility]  Dietary issues and exercise activities discussed:     Goals Addressed             This Visit's Progress    DIET - EAT MORE FRUITS AND VEGETABLES   On track    DIET - INCREASE WATER INTAKE   Not on track    Recommend increasing water intake to 4 glasses a day.        Depression Screen    04/13/2022    1:12 PM 04/05/2022    8:25 AM 12/29/2021    2:01 PM 08/26/2021    9:16 AM 05/20/2021    8:23 AM 04/09/2021    1:07 PM 09/11/2020    9:42 AM  PHQ 2/9 Scores  PHQ - 2 Score 0 0 0 1 0 0 0  PHQ- 9 Score 0 0  1 0  0    Fall Risk    04/13/2022    1:08 PM 04/05/2022    8:25 AM 12/29/2021    2:00 PM 05/20/2021    8:22 AM 04/09/2021    1:11 PM  [place] in the past year? 0 0 0 0 0  Number falls in past yr: 0 0 0 0 0  Injury with Fall? 0 0 0 0 0  Risk for fall due to : No Fall Risks No  Fall Risks No Fall Risks No Fall Risks No Fall Risks  Follow up Falls prevention discussed;Education provided   Falls evaluation completed Falls evaluation completed    [place]:  Any stairs in or around the home? No  If so, are there any without handrails? No  Home free of loose throw rugs in walkways, pet beds, electrical cords, etc? Yes  Adequate  lighting in your home to reduce risk of falls? Yes   ASSISTIVE DEVICES UTILIZED TO PREVENT FALLS:  Life alert? No  Use of a cane, walker or w/c? No  Grab bars in the bathroom? No  Shower chair or bench in shower? No  Elevated toilet seat or a handicapped toilet? yes   Cognitive Function:        04/13/2022    1:20 PM 03/22/2018    9:42 AM 03/04/2017   10:27 AM  6CIT Screen  What Year? 0 points 0 points 0 points  What month? 0 points 0 points 0 points  What time? 0 points 0 points 0 points  Count back from 20 0 points 0 points 0 points  Months in reverse 0 points 0 points 0 points  Repeat phrase 0 points 0 points 2 points  Total Score 0 points 0 points 2 points    Immunizations Immunization History  Administered Date(s) Administered   Fluad Quad(high Dose 65+) 12/04/2020   Influenza, High Dose Seasonal PF 12/01/2015, 12/03/2016, 12/06/2017, 12/12/2019   Influenza-Unspecified 11/30/2014, 12/12/2018, 12/02/2021   PFIZER Comirnaty(Gray Top)Covid-19 Tri-Sucrose Vaccine 07/23/2020   PFIZER(Purple Top)SARS-COV-2 Vaccination 03/07/2019, 03/28/2019, 01/17/2020   Pneumococcal Conjugate-13 12/07/2012, 03/14/2014   Pneumococcal Polysaccharide-23 02/03/2005, 12/13/2008   Tdap 12/06/2007, 08/30/2018   Zoster Recombinat (Shingrix) 12/03/2016, 04/14/2017   Zoster, Live 12/31/2010    TDAP status: Up to date  Flu Vaccine status: Up to date  Pneumococcal vaccine status: Up to date  Covid-19 vaccine status: Completed vaccines  Qualifies for Shingles Vaccine? Yes   Zostavax completed Yes   Shingrix Completed?: Yes  Screening Tests Health Maintenance  Topic Date Due   OPHTHALMOLOGY EXAM  07/26/2021   COVID-19 Vaccine (5 - 2023-24 season) 10/30/2021   COLONOSCOPY (Pts 45-4yr Insurance coverage will need to be confirmed)  04/04/2022   Diabetic kidney evaluation - eGFR measurement  08/12/2022   HEMOGLOBIN A1C  10/04/2022   Diabetic kidney evaluation - Urine ACR  12/30/2022   FOOT  EXAM  12/30/2022   Medicare Annual Wellness (AWV)  04/14/2023   DTaP/Tdap/Td (3 - Td or Tdap) 08/29/2028   Pneumonia Vaccine 77 Years old  Completed   INFLUENZA VACCINE  Completed   Hepatitis C Screening  Completed   Zoster Vaccines- Shingrix  Completed   HPV VACCINES  Aged Out    Health Maintenance  Health Maintenance Due  Topic Date Due   OPHTHALMOLOGY EXAM  07/26/2021   COVID-19 Vaccine (5 - 2023-24 season) 10/30/2021   COLONOSCOPY (Pts 45-472yrInsurance coverage will need to be confirmed)  04/04/2022    Colorectal cancer screening: Type of screening: Colonoscopy. Completed yes. Repeat every 5 years scheduled 06/28/2022 w/Dr Locklear  Lung Cancer Screening: (Low Dose CT Chest recommended if Age 79-80ears, 30 pack-year currently smoking OR have quit w/in 15years.) does not qualify.   Lung Cancer Screening Referral: no  Additional Screening:  Hepatitis C Screening: does not qualify; Completed yes  Vision Screening: Recommended annual ophthalmology exams for early detection of glaucoma and other disorders of the eye. Is the patient up to  date with their annual eye exam?  No  Who is the provider or what is the name of the office in which the patient attends annual eye exams? Dr [place]/ [facility] If pt is not established with a provider, would they like to be referred to a provider to establish care? No .   Dental Screening: Recommended annual dental exams for proper oral hygiene  Community Resource Referral / Chronic Care Management: CRR required this visit?  No   CCM required this visit?  No      Plan:     I have personally reviewed and noted the following in the patient's chart:   Medical and social history Use of alcohol, tobacco or illicit drugs  Current medications and supplements including opioid prescriptions. Patient is not currently taking opioid prescriptions. Functional ability and status Nutritional status Physical activity Advanced  directives List of other physicians Hospitalizations, surgeries, and ER visits in previous 12 months Vitals Screenings to include cognitive, depression, and falls Referrals and appointments  In addition, I have reviewed and discussed with patient certain preventive protocols, quality metrics, and best practice recommendations. A written personalized care plan for preventive services as well as general preventive health recommendations were provided to patient.     Roger Shelter, LPN   [id number]   Nurse Notes: pt complains of experiencing loss of appetite loss:does not want foods and does not eat enough.  Pt is scheduled for Colonoscopy and Endoscopy on 06/28/2022 (per pt).

## 2022-04-13 NOTE — Patient Instructions (Signed)
[name] , Thank you for taking time to come for your Medicare Wellness Visit. I appreciate your ongoing commitment to your health goals. Please review the following plan we discussed and let me know if I can assist you in the future.   These are the goals we discussed:  Goals      Chronic Disease Management     CARE PLAN ENTRY (see longitudinal plan of care for additional care plan information)  Current Barriers: Chronic Disease Management support and education needs related to Hypertension, Hyperlipidemia, Diabetes and GERD.  Case Manager Clinical Goal(s):  Over the next 90 days, patient will attend all scheduled medical appointments. -Complete Over the next 90 days, patient will take all medications as prescribed.-Complete Over the next 90 days, patient will monitor blood glucose daily and maintain a log.-Complete Over the next 90 days, patient will continue to engaged in mild exercises and activities as tolerated.-Complete Over the next 90 days, patient will follow safety recommendations to prevent falls.-Complete Over the next 60 days, patient will document daily nutritional intake. Over the next 30 days, patient will notify team if he requires assistance with obtaining needed medical supplies.-Complete  Interventions:  Inter-disciplinary care team collaboration (see longitudinal plan of care) Discussed compliance with treatments and plan for care management. Remains compliant with medications, blood glucose monitoring and provider treatment recommendations. No concerns regarding prescription costs. Reports recent home FBS readings have been within range. We discussed his last A1C. In February his A1C was elevated from 6.7 to 7.5 %. He describes this as his normal "hike" after the winter months. He attributes the elevation to his eating habits around the holidays. He is very motivated and confident that his A1C will improve during the Spring months. We discussed optional referrals for  diabetes education or nutrition. He reports receiving several needed resources and declined need for additional referrals or assistance. Overall he reports doing very well. He agreed to notify his provider or call if additional outreach is required.   Patient Self Care Activities:  Self administers medications  Attends scheduled provider appointments Calls pharmacy for medication refills Performs ADL's independently Performs IADL's independently Calls provider office for new concerns or questions   Please see past updates related to this goal by clicking on the "Past Updates" button in the selected goal       DIET - EAT [place] increasing water intake to 4 glasses a day.      Exercise 3x per week (30 min per time)     Recommend to exercise for 3 days a week for at least 30 minutes at a time.          This is a list of the screening recommended for you and due dates:  Health Maintenance  Topic Date Due   Eye exam for diabetics  07/26/2021   COVID-19 Vaccine (5 - 2023-24 season) 10/30/2021   Colon Cancer Screening  04/04/2022   Yearly kidney function blood test for diabetes  08/12/2022   Hemoglobin A1C  10/04/2022   Yearly kidney health urinalysis for diabetes  12/30/2022   Complete foot exam   12/30/2022   Medicare Annual Wellness Visit  04/14/2023   DTaP/Tdap/Td vaccine (3 - Td or Tdap) 08/29/2028   Pneumonia Vaccine  Completed   Flu Shot  Completed   Hepatitis C Screening: USPSTF Recommendation to screen - Ages 11-79 yo.  Completed  Zoster (Shingles) Vaccine  Completed   HPV Vaccine  Aged Out    Advanced directives: yes  Conditions/risks identified: none  Next appointment: Follow up in one year for your annual wellness visit. 04/18/2023 @2pm$ Francee Nodal  Preventive Care 1 Years and Older, Male  Preventive care refers to lifestyle choices and visits with your health care provider that can promote  health and wellness. What does preventive care include? A yearly physical exam. This is also called an annual well check. Dental exams once or twice a year. Routine eye exams. Ask your health care provider how often you should have your eyes checked. Personal lifestyle choices, including: Daily care of your teeth and gums. Regular physical activity. Eating a healthy diet. Avoiding tobacco and drug use. Limiting alcohol use. Practicing safe sex. Taking low doses of aspirin every day. Taking vitamin and mineral supplements as recommended by your health care provider. What happens during an annual well check? The services and screenings done by your health care provider during your annual well check will depend on your age, overall health, lifestyle risk factors, and family history of disease. Counseling  Your health care provider may ask you questions about your: Alcohol use. Tobacco use. Drug use. Emotional well-being. Home and relationship well-being. Sexual activity. Eating habits. History of falls. Memory and ability to understand (cognition). Work and work Statistician. Screening  You may have the following tests or measurements: Height, weight, and BMI. Blood pressure. Lipid and cholesterol levels. These may be checked every 5 years, or more frequently if you are over 20 years old. Skin check. Lung cancer screening. You may have this screening every year starting at age 32 if you have a 30-pack-year history of smoking and currently smoke or have quit within the past 15 years. Fecal occult blood test (FOBT) of the stool. You may have this test every year starting at age 46. Flexible sigmoidoscopy or colonoscopy. You may have a sigmoidoscopy every 5 years or a colonoscopy every 10 years starting at age 73. Prostate cancer screening. Recommendations will vary depending on your family history and other risks. Hepatitis C blood test. Hepatitis B blood test. Sexually transmitted  disease (STD) testing. Diabetes screening. This is done by checking your blood sugar (glucose) after you have not eaten for a while (fasting). You may have this done every 1-3 years. Abdominal aortic aneurysm (AAA) screening. You may need this if you are a current or former smoker. Osteoporosis. You may be screened starting at age 7 if you are at high risk. Talk with your health care provider about your test results, treatment options, and if necessary, the need for more tests. Vaccines  Your health care provider may recommend certain vaccines, such as: Influenza vaccine. This is recommended every year. Tetanus, diphtheria, and acellular pertussis (Tdap, Td) vaccine. You may need a Td booster every 10 years. Zoster vaccine. You may need this after age 56. Pneumococcal 13-valent conjugate (PCV13) vaccine. One dose is recommended after age 57. Pneumococcal polysaccharide (PPSV23) vaccine. One dose is recommended after age 74. Talk to your health care provider about which screenings and vaccines you need and how often you need them. This information is not intended to replace advice given to you by your health care provider. Make sure you discuss any questions you have with your health care provider. Document Released: 03/14/2015 Document Revised: 11/05/2015 Document Reviewed: 12/17/2014 Elsevier Interactive Patient Education  2017 [place] Prevention in the Home Falls can cause injuries. They can happen to  people of all ages. There are many things you can do to make your home safe and to help prevent falls. What can I do on the outside of my home? Regularly fix the edges of walkways and driveways and fix any cracks. Remove anything that might make you trip as you walk through a door, such as a raised step or threshold. Trim any bushes or trees on the path to your home. Use bright outdoor lighting. Clear any walking paths of anything that might make someone trip, such as rocks or  tools. Regularly check to see if handrails are loose or broken. Make sure that both sides of any steps have handrails. Any raised decks and porches should have guardrails on the edges. Have any leaves, snow, or ice cleared regularly. Use sand or salt on walking paths during winter. Clean up any spills in your garage right away. This includes oil or grease spills. What can I do in the bathroom? Use night lights. Install grab bars by the toilet and in the tub and shower. Do not use towel bars as grab bars. Use non-skid mats or decals in the tub or shower. If you need to sit down in the shower, use a plastic, non-slip stool. Keep the floor dry. Clean up any water that spills on the floor as soon as it happens. Remove soap buildup in the tub or shower regularly. Attach bath mats securely with double-sided non-slip rug tape. Do not have throw rugs and other things on the floor that can make you trip. What can I do in the bedroom? Use night lights. Make sure that you have a light by your bed that is easy to reach. Do not use any sheets or blankets that are too big for your bed. They should not hang down onto the floor. Have a firm chair that has side arms. You can use this for support while you get dressed. Do not have throw rugs and other things on the floor that can make you trip. What can I do in the kitchen? Clean up any spills right away. Avoid walking on wet floors. Keep items that you use a lot in easy-to-reach places. If you need to reach something above you, use a strong step stool that has a grab bar. Keep electrical cords out of the way. Do not use floor polish or wax that makes floors slippery. If you must use wax, use non-skid floor wax. Do not have throw rugs and other things on the floor that can make you trip. What can I do with my stairs? Do not leave any items on the stairs. Make sure that there are handrails on both sides of the stairs and use them. Fix handrails that are  broken or loose. Make sure that handrails are as long as the stairways. Check any carpeting to make sure that it is firmly attached to the stairs. Fix any carpet that is loose or worn. Avoid having throw rugs at the top or bottom of the stairs. If you do have throw rugs, attach them to the floor with carpet tape. Make sure that you have a light switch at the top of the stairs and the bottom of the stairs. If you do not have them, ask someone to add them for you. What else can I do to help prevent falls? Wear shoes that: Do not have high heels. Have rubber bottoms. Are comfortable and fit you well. Are closed at the toe. Do not wear sandals. If you  use a stepladder: Make sure that it is fully opened. Do not climb a closed stepladder. Make sure that both sides of the stepladder are locked into place. Ask someone to hold it for you, if possible. Clearly mark and make sure that you can see: Any grab bars or handrails. First and last steps. Where the edge of each step is. Use tools that help you move around (mobility aids) if they are needed. These include: Canes. Walkers. Scooters. Crutches. Turn on the lights when you go into a dark area. Replace any light bulbs as soon as they burn out. Set up your furniture so you have a clear path. Avoid moving your furniture around. If any of your floors are uneven, fix them. If there are any pets around you, be aware of where they are. Review your medicines with your doctor. Some medicines can make you feel dizzy. This can increase your chance of falling. Ask your doctor what other things that you can do to help prevent falls. This information is not intended to replace advice given to you by your health care provider. Make sure you discuss any questions you have with your health care provider. Document Released: 12/12/2008 Document Revised: 07/24/2015 Document Reviewed: 03/22/2014 Elsevier Interactive Patient Education  2017 [employer].

## 2022-04-20 DIAGNOSIS — M531 Cervicobrachial syndrome: Secondary | ICD-10-CM | POA: Diagnosis not present

## 2022-04-20 DIAGNOSIS — M5137 Other intervertebral disc degeneration, lumbosacral region: Secondary | ICD-10-CM | POA: Diagnosis not present

## 2022-04-20 DIAGNOSIS — M9902 Segmental and somatic dysfunction of thoracic region: Secondary | ICD-10-CM | POA: Diagnosis not present

## 2022-04-20 DIAGNOSIS — M546 Pain in thoracic spine: Secondary | ICD-10-CM | POA: Diagnosis not present

## 2022-04-20 DIAGNOSIS — M9903 Segmental and somatic dysfunction of lumbar region: Secondary | ICD-10-CM | POA: Diagnosis not present

## 2022-04-20 DIAGNOSIS — M9901 Segmental and somatic dysfunction of cervical region: Secondary | ICD-10-CM | POA: Diagnosis not present

## 2022-05-11 DIAGNOSIS — H401121 Primary open-angle glaucoma, left eye, mild stage: Secondary | ICD-10-CM | POA: Diagnosis not present

## 2022-05-17 DIAGNOSIS — H43813 Vitreous degeneration, bilateral: Secondary | ICD-10-CM | POA: Diagnosis not present

## 2022-05-17 DIAGNOSIS — H401112 Primary open-angle glaucoma, right eye, moderate stage: Secondary | ICD-10-CM | POA: Diagnosis not present

## 2022-05-17 DIAGNOSIS — H401121 Primary open-angle glaucoma, left eye, mild stage: Secondary | ICD-10-CM | POA: Diagnosis not present

## 2022-05-17 DIAGNOSIS — E119 Type 2 diabetes mellitus without complications: Secondary | ICD-10-CM | POA: Diagnosis not present

## 2022-05-17 LAB — HM DIABETES EYE EXAM

## 2022-05-18 ENCOUNTER — Encounter: Payer: Self-pay | Admitting: Family Medicine

## 2022-05-18 DIAGNOSIS — M9901 Segmental and somatic dysfunction of cervical region: Secondary | ICD-10-CM | POA: Diagnosis not present

## 2022-05-18 DIAGNOSIS — M546 Pain in thoracic spine: Secondary | ICD-10-CM | POA: Diagnosis not present

## 2022-05-18 DIAGNOSIS — M9903 Segmental and somatic dysfunction of lumbar region: Secondary | ICD-10-CM | POA: Diagnosis not present

## 2022-05-18 DIAGNOSIS — M5137 Other intervertebral disc degeneration, lumbosacral region: Secondary | ICD-10-CM | POA: Diagnosis not present

## 2022-05-18 DIAGNOSIS — M9902 Segmental and somatic dysfunction of thoracic region: Secondary | ICD-10-CM | POA: Diagnosis not present

## 2022-05-18 DIAGNOSIS — M531 Cervicobrachial syndrome: Secondary | ICD-10-CM | POA: Diagnosis not present

## 2022-05-24 ENCOUNTER — Other Ambulatory Visit: Payer: Self-pay | Admitting: Family Medicine

## 2022-05-27 DIAGNOSIS — E7800 Pure hypercholesterolemia, unspecified: Secondary | ICD-10-CM | POA: Diagnosis not present

## 2022-05-27 DIAGNOSIS — I519 Heart disease, unspecified: Secondary | ICD-10-CM | POA: Diagnosis not present

## 2022-05-27 DIAGNOSIS — I2510 Atherosclerotic heart disease of native coronary artery without angina pectoris: Secondary | ICD-10-CM | POA: Diagnosis not present

## 2022-05-27 DIAGNOSIS — E119 Type 2 diabetes mellitus without complications: Secondary | ICD-10-CM | POA: Diagnosis not present

## 2022-05-27 DIAGNOSIS — R001 Bradycardia, unspecified: Secondary | ICD-10-CM | POA: Diagnosis not present

## 2022-05-27 DIAGNOSIS — I252 Old myocardial infarction: Secondary | ICD-10-CM | POA: Diagnosis not present

## 2022-05-27 DIAGNOSIS — Z955 Presence of coronary angioplasty implant and graft: Secondary | ICD-10-CM | POA: Diagnosis not present

## 2022-05-27 DIAGNOSIS — K219 Gastro-esophageal reflux disease without esophagitis: Secondary | ICD-10-CM | POA: Diagnosis not present

## 2022-05-27 DIAGNOSIS — I10 Essential (primary) hypertension: Secondary | ICD-10-CM | POA: Diagnosis not present

## 2022-06-15 DIAGNOSIS — M531 Cervicobrachial syndrome: Secondary | ICD-10-CM | POA: Diagnosis not present

## 2022-06-15 DIAGNOSIS — M546 Pain in thoracic spine: Secondary | ICD-10-CM | POA: Diagnosis not present

## 2022-06-15 DIAGNOSIS — M9901 Segmental and somatic dysfunction of cervical region: Secondary | ICD-10-CM | POA: Diagnosis not present

## 2022-06-15 DIAGNOSIS — M9902 Segmental and somatic dysfunction of thoracic region: Secondary | ICD-10-CM | POA: Diagnosis not present

## 2022-06-15 DIAGNOSIS — M5137 Other intervertebral disc degeneration, lumbosacral region: Secondary | ICD-10-CM | POA: Diagnosis not present

## 2022-06-15 DIAGNOSIS — M9903 Segmental and somatic dysfunction of lumbar region: Secondary | ICD-10-CM | POA: Diagnosis not present

## 2022-06-17 DIAGNOSIS — L821 Other seborrheic keratosis: Secondary | ICD-10-CM | POA: Diagnosis not present

## 2022-06-17 DIAGNOSIS — L300 Nummular dermatitis: Secondary | ICD-10-CM | POA: Diagnosis not present

## 2022-06-17 DIAGNOSIS — L814 Other melanin hyperpigmentation: Secondary | ICD-10-CM | POA: Diagnosis not present

## 2022-06-17 DIAGNOSIS — L570 Actinic keratosis: Secondary | ICD-10-CM | POA: Diagnosis not present

## 2022-06-21 ENCOUNTER — Encounter: Payer: Self-pay | Admitting: *Deleted

## 2022-06-21 ENCOUNTER — Ambulatory Visit (INDEPENDENT_AMBULATORY_CARE_PROVIDER_SITE_OTHER): Payer: Medicare HMO | Admitting: Family Medicine

## 2022-06-21 ENCOUNTER — Encounter: Payer: Self-pay | Admitting: Family Medicine

## 2022-06-21 VITALS — BP 139/74 | HR 70 | Temp 97.7°F | Ht 67.0 in | Wt 164.0 lb

## 2022-06-21 DIAGNOSIS — J302 Other seasonal allergic rhinitis: Secondary | ICD-10-CM

## 2022-06-21 DIAGNOSIS — J0110 Acute frontal sinusitis, unspecified: Secondary | ICD-10-CM

## 2022-06-21 MED ORDER — AMOXICILLIN 500 MG PO CAPS: 1000.0000 mg | ORAL_CAPSULE | Freq: Two times a day (BID) | ORAL | 0 refills | Status: AC

## 2022-06-21 NOTE — Progress Notes (Signed)
Established patient visit   Patient: Carl Dominguez   DOB: [birth year]   77 y.o. Male  MRN: [id number] Visit Date: 06/21/2022  Today's healthcare provider: Mila Merry, MD   No chief complaint on file.  Subjective    HPI Complains of persistent nasal drainage, congestion, cough productive mostly clear sputum for last three days much worse after mowing yard. He has year round allergies, takes cetirizine, montelukast, and fluticasone nasal spray off and own, added OTC loratadine the last couple of days. Concerned that he has colonoscopy 1 week from day and does not want to have to cancel it.   Medications: Outpatient Medications Prior to Visit  Medication Sig   acetaminophen (TYLENOL) 500 MG tablet Take 500 mg by mouth 2 (two) times daily.   aspirin 81 MG tablet Take 81 mg by mouth daily.    atorvastatin (LIPITOR) 40 MG tablet Take 1 tablet (40 mg total) by mouth at bedtime.   Blood Glucose Calibration (TRUE METRIX LEVEL 1) Low SOLN 1 each by In Vitro route daily.   cetirizine (ZYRTEC) 10 MG tablet TAKE 1 TABLET EVERY DAY   cholecalciferol (VITAMIN D) 1000 UNITS tablet Take 1,000 Units by mouth daily.    famotidine (PEPCID) 20 MG tablet Take 20 mg by mouth daily.    glipiZIDE (GLUCOTROL) 10 MG tablet Take 1 tablet (10 mg total) by mouth daily.   insulin glargine (LANTUS) 100 UNIT/ML Solostar Pen Inject 7 Units into the skin daily. Up to a max of 30 units daily.   Insulin Pen Needle (BD PEN NEEDLE NANO 2ND GEN) 32G X 4 MM MISC USE WITH LANTUS INSULIN FOR 1 INJECTION DAILY. REPLACE NEEDLE FOR EACH INJECTION   latanoprost (XALATAN) 0.005 % ophthalmic solution Place 1 drop into both eyes at bedtime.    losartan (COZAAR) 50 MG tablet TAKE 1 TABLET EVERY DAY   metFORMIN (GLUCOPHAGE) 500 MG tablet Take 1 tablet (500 mg total) by mouth daily with breakfast.   metoprolol succinate (TOPROL-XL) 25 MG 24 hr tablet Take 1 tablet (25 mg total) by mouth daily.   montelukast (SINGULAIR) 10 MG  tablet TAKE 1 TABLET AT BEDTIME   pioglitazone (ACTOS) 45 MG tablet TAKE 1 TABLET EVERY DAY   psyllium (METAMUCIL SMOOTH TEXTURE) 28 % packet Take 1 packet by mouth 2 (two) times daily.    timolol (TIMOPTIC) 0.5 % ophthalmic solution 1 drop every morning.   TRUE METRIX BLOOD GLUCOSE TEST test strip USE ONE TIME DAILY AS DIRECTED   TRUEplus Lancets 33G MISC USE ONE TIME DAILY AS DIRECTED   [DISCONTINUED] cholestyramine (QUESTRAN) 4 GM/DOSE powder    No facility-administered medications prior to visit.    Review of Systems  Constitutional:  Negative for chills, diaphoresis and fever.  HENT:  Positive for congestion, postnasal drip, rhinorrhea, sinus pressure and sneezing. Negative for ear pain and sore throat.   Respiratory:  Positive for cough. Negative for chest tightness, shortness of breath and wheezing.   Cardiovascular:  Positive for palpitations. Negative for chest pain.       Objective    BP 139/74 (BP Location: Right Arm, Patient Position: Sitting, Cuff Size: Normal)   Pulse 70   Temp 97.7 F (36.5 C)   Ht  (1.702 m)   Wt 164 lb (74.4 kg)   SpO2 (!) 70%   BMI 25.69 kg/m    Physical Exam  General Appearance:    Well developed, well nourished male, alert, cooperative, in no acute  distress  HENT:   ENT exam normal, no neck nodes or sinus tenderness, neck without nodes, throat normal without erythema or exudate, frontal sinus tender, post nasal drip noted, and nasal mucosa pale and congested  Eyes:    PERRL, conjunctiva/corneas clear, EOM's intact       Lungs:     Clear to auscultation bilaterally, respirations unlabored  Heart:    Normal heart rate. Normal rhythm. No murmurs, rubs, or gallops.    Neurologic:   Awake, alert, oriented x 3. No apparent focal neurological           defect.         Assessment & Plan     1. Seasonal allergic rhinitis, unspecified trigger Already on antihistamine, montelukast and steroid nasal spray. Recommend he start using nasal  saline irrigation.   2. Acute non-recurrent frontal sinusitis  - amoxicillin (AMOXIL) 500 MG capsule; Take 2 capsules (1,000 mg total) by mouth 2 (two) times daily for 7 days.  Dispense: 28 capsule; Refill: 0   Call if symptoms change or if not rapidly improving.           Mila Merry, MD  [facility] [phone] (phone) [phone] (fax)  [facility]

## 2022-06-25 ENCOUNTER — Encounter: Payer: Self-pay | Admitting: *Deleted

## 2022-06-28 ENCOUNTER — Ambulatory Visit: Payer: Medicare HMO | Admitting: Certified Registered Nurse Anesthetist

## 2022-06-28 ENCOUNTER — Ambulatory Visit
Admission: RE | Admit: 2022-06-28 | Discharge: 2022-06-28 | Disposition: A | Discharge status: Home / Self Care | Payer: Medicare HMO | Attending: Gastroenterology | Admitting: Gastroenterology

## 2022-06-28 ENCOUNTER — Encounter
Admission: RE | Disposition: A | Discharge status: Home / Self Care | Payer: Self-pay | Source: Home / Self Care | Attending: Gastroenterology

## 2022-06-28 ENCOUNTER — Encounter: Payer: Self-pay | Admitting: *Deleted

## 2022-06-28 DIAGNOSIS — N189 Chronic kidney disease, unspecified: Secondary | ICD-10-CM | POA: Insufficient documentation

## 2022-06-28 DIAGNOSIS — I252 Old myocardial infarction: Secondary | ICD-10-CM | POA: Insufficient documentation

## 2022-06-28 DIAGNOSIS — I2510 Atherosclerotic heart disease of native coronary artery without angina pectoris: Secondary | ICD-10-CM | POA: Insufficient documentation

## 2022-06-28 DIAGNOSIS — K641 Second degree hemorrhoids: Secondary | ICD-10-CM | POA: Insufficient documentation

## 2022-06-28 DIAGNOSIS — Z87891 Personal history of nicotine dependence: Secondary | ICD-10-CM | POA: Insufficient documentation

## 2022-06-28 DIAGNOSIS — Z1211 Encounter for screening for malignant neoplasm of colon: Secondary | ICD-10-CM | POA: Diagnosis not present

## 2022-06-28 DIAGNOSIS — Z79899 Other long term (current) drug therapy: Secondary | ICD-10-CM | POA: Insufficient documentation

## 2022-06-28 DIAGNOSIS — Z955 Presence of coronary angioplasty implant and graft: Secondary | ICD-10-CM | POA: Diagnosis not present

## 2022-06-28 DIAGNOSIS — Z7982 Long term (current) use of aspirin: Secondary | ICD-10-CM | POA: Insufficient documentation

## 2022-06-28 DIAGNOSIS — K219 Gastro-esophageal reflux disease without esophagitis: Secondary | ICD-10-CM | POA: Diagnosis not present

## 2022-06-28 DIAGNOSIS — Z09 Encounter for follow-up examination after completed treatment for conditions other than malignant neoplasm: Secondary | ICD-10-CM | POA: Diagnosis not present

## 2022-06-28 DIAGNOSIS — Z86010 Personal history of colonic polyps: Secondary | ICD-10-CM | POA: Diagnosis not present

## 2022-06-28 DIAGNOSIS — K222 Esophageal obstruction: Secondary | ICD-10-CM | POA: Diagnosis not present

## 2022-06-28 DIAGNOSIS — I129 Hypertensive chronic kidney disease with stage 1 through stage 4 chronic kidney disease, or unspecified chronic kidney disease: Secondary | ICD-10-CM | POA: Diagnosis not present

## 2022-06-28 DIAGNOSIS — E7800 Pure hypercholesterolemia, unspecified: Secondary | ICD-10-CM | POA: Diagnosis not present

## 2022-06-28 DIAGNOSIS — K449 Diaphragmatic hernia without obstruction or gangrene: Secondary | ICD-10-CM | POA: Insufficient documentation

## 2022-06-28 DIAGNOSIS — K2970 Gastritis, unspecified, without bleeding: Secondary | ICD-10-CM | POA: Insufficient documentation

## 2022-06-28 DIAGNOSIS — K649 Unspecified hemorrhoids: Secondary | ICD-10-CM | POA: Diagnosis not present

## 2022-06-28 DIAGNOSIS — E1122 Type 2 diabetes mellitus with diabetic chronic kidney disease: Secondary | ICD-10-CM | POA: Diagnosis not present

## 2022-06-28 DIAGNOSIS — K2960 Other gastritis without bleeding: Secondary | ICD-10-CM | POA: Diagnosis not present

## 2022-06-28 HISTORY — DX: Gastro-esophageal reflux disease without esophagitis: K21.9

## 2022-06-28 HISTORY — DX: Dyspnea, unspecified: R06.00

## 2022-06-28 HISTORY — DX: Cardiac arrhythmia, unspecified: I49.9

## 2022-06-28 HISTORY — PX: ESOPHAGOGASTRODUODENOSCOPY (EGD) WITH PROPOFOL: SHX5813

## 2022-06-28 HISTORY — DX: Unspecified hemorrhoids: K64.9

## 2022-06-28 HISTORY — DX: Polyp of colon: K63.5

## 2022-06-28 HISTORY — PX: COLONOSCOPY WITH PROPOFOL: SHX5780

## 2022-06-28 HISTORY — DX: Atherosclerotic heart disease of native coronary artery without angina pectoris: I25.10

## 2022-06-28 LAB — GLUCOSE, CAPILLARY: Glucose-Capillary: 190 mg/dL — ABNORMAL HIGH (ref 70–99)

## 2022-06-28 SURGERY — COLONOSCOPY WITH PROPOFOL
Anesthesia: General

## 2022-06-28 MED ORDER — LIDOCAINE HCL (CARDIAC) PF 100 MG/5ML IV SOSY
PREFILLED_SYRINGE | INTRAVENOUS | Status: DC | PRN
  Administered 2022-06-28: 100 mg via INTRAVENOUS

## 2022-06-28 MED ORDER — GLYCOPYRROLATE 0.2 MG/ML IJ SOLN
INTRAMUSCULAR | Status: AC
  Filled 2022-06-28: qty 1

## 2022-06-28 MED ORDER — PHENYLEPHRINE HCL (PRESSORS) 10 MG/ML IV SOLN
INTRAVENOUS | Status: DC | PRN
  Administered 2022-06-28: 80 ug via INTRAVENOUS

## 2022-06-28 MED ORDER — SODIUM CHLORIDE 0.9 % IV SOLN
INTRAVENOUS | Status: DC
  Administered 2022-06-28: 1000 mL via INTRAVENOUS

## 2022-06-28 MED ORDER — GLYCOPYRROLATE 0.2 MG/ML IJ SOLN
INTRAMUSCULAR | Status: DC | PRN
  Administered 2022-06-28: .1 mg via INTRAVENOUS

## 2022-06-28 MED ORDER — PROPOFOL 10 MG/ML IV BOLUS
INTRAVENOUS | Status: DC | PRN
  Administered 2022-06-28: 60 mg via INTRAVENOUS

## 2022-06-28 MED ORDER — PROPOFOL 500 MG/50ML IV EMUL
INTRAVENOUS | Status: DC | PRN
  Administered 2022-06-28: 140 ug/kg/min via INTRAVENOUS

## 2022-06-28 NOTE — Op Note (Addendum)
[facility]astroenterology Patient Name: Carl Dominguez Procedure Date: 06/28/2022 9:54 AM MRN: [id number] Account #: [id number] Date of Birth: 12-Aug-1945 Admit Type: Outpatient Age: 77 Room: [facility] ENDO ROOM 3 Gender: Male Note Status: Finalized Instrument Name: Upper Endoscope [id number] Procedure:             Upper GI endoscopy Indications:           Gastro-esophageal reflux disease Providers:             Eather Colas MD, MD Referring MD:          No Local Md, MD (Referring MD) Medicines:             Monitored Anesthesia Care Complications:         No immediate complications. Estimated blood loss:                         Minimal. Procedure:             Pre-Anesthesia Assessment:                        - Prior to the procedure, a History and Physical was                         performed, and patient medications and allergies were                         reviewed. The patient is competent. The risks and                         benefits of the procedure and the sedation options and                         risks were discussed with the patient. All questions                         were answered and informed consent was obtained.                         Patient identification and proposed procedure were                         verified by the physician, the nurse, the                         anesthesiologist, the anesthetist and the technician                         in the endoscopy suite. Mental Status Examination:                         alert and oriented. Airway Examination: normal                         oropharyngeal airway and neck mobility. Respiratory                         Examination: clear to auscultation. CV Examination:  normal. Prophylactic Antibiotics: The patient does not                         require prophylactic antibiotics. Prior                         Anticoagulants: The patient has taken no anticoagulant                          or antiplatelet agents. ASA Grade Assessment: III - A                         patient with severe systemic disease. After reviewing                         the risks and benefits, the patient was deemed in                         satisfactory condition to undergo the procedure. The                         anesthesia plan was to use monitored anesthesia care                         (MAC). Immediately prior to administration of                         medications, the patient was re-assessed for adequacy                         to receive sedatives. The heart rate, respiratory                         rate, oxygen saturations, blood pressure, adequacy of                         pulmonary ventilation, and response to care were                         monitored throughout the procedure. The physical                         status of the patient was re-assessed after the                         procedure.                        After obtaining informed consent, the endoscope was                         passed under direct vision. Throughout the procedure,                         the patient's blood pressure, pulse, and oxygen                         saturations were monitored continuously. The Endoscope  was introduced through the mouth, and advanced to the                         second part of duodenum. The upper GI endoscopy was                         accomplished without difficulty. The patient tolerated                         the procedure well. Findings:      A non-obstructing Schatzki ring was found in the lower third of the       esophagus.      A small hiatal hernia was present.      Patchy mild inflammation characterized by adherent blood and erythema       was found in the gastric antrum. Biopsies were taken with a cold forceps       for Helicobacter pylori testing. Estimated blood loss was minimal.      The examined duodenum was  normal. Impression:            - Non-obstructing Schatzki ring.                        - Small hiatal hernia.                        - Gastritis. Biopsied.                        - Normal examined duodenum. Recommendation:        - Await pathology results.                        - Perform a colonoscopy today. Procedure Code(s):     --- Professional ---                        [phone], Esophagogastroduodenoscopy, flexible,                         transoral; with biopsy, single or multiple Diagnosis Code(s):     --- Professional ---                        K22.2, Esophageal obstruction                        K44.9, Diaphragmatic hernia without obstruction or                         gangrene                        K29.70, Gastritis, unspecified, without bleeding                        K21.9, Gastro-esophageal reflux disease without                         esophagitis CPT copyright 2022 American Medical Association. All rights reserved. The codes documented in this report are preliminary and upon coder review may  be revised to meet current compliance requirements. Eather Colas MD, MD 06/28/2022  10:26:32 AM Number of Addenda: 0 Note Initiated On: 06/28/2022 9:54 AM Estimated Blood Loss:  Estimated blood loss was minimal.      [facility]

## 2022-06-28 NOTE — Anesthesia Procedure Notes (Signed)
Date/Time: 06/28/2022 9:50 AM  Performed by: Malva Cogan, CRNAPre-anesthesia Checklist: Patient identified, Emergency Drugs available, Suction available, Patient being monitored and Timeout performed Patient Re-evaluated:Patient Re-evaluated prior to induction Oxygen Delivery Method: Nasal cannula Induction Type: IV induction Placement Confirmation: CO2 detector and positive ETCO2

## 2022-06-28 NOTE — H&P (Signed)
Outpatient short stay form Pre-procedure 06/28/2022  Carl Bill, MD  Primary Physician: Ronnald Ramp, MD  Reason for visit:  GERD/Surveillance colonoscopy  History of present illness:    77 y/o gentleman with history of DM II, hypertension, and GERD here for EGD/Colonoscopy for GERD and history of adenomatous polyps on last colonoscopy in 2019. No blood thinners except aspirin. No significant abdominal surgeries. Constipation has responded to miralax. No family history of GI malignancies.    Current Facility-Administered Medications:    0.9 %  sodium chloride infusion, , Intravenous, Continuous, Ilse Billman, Rossie Muskrat, MD  Medications Prior to Admission  Medication Sig Dispense Refill Last Dose   acetaminophen (TYLENOL) 500 MG tablet Take 500 mg by mouth 2 (two) times daily.   Past Week   amoxicillin (AMOXIL) 500 MG capsule Take 2 capsules (1,000 mg total) by mouth 2 (two) times daily for 7 days. 28 capsule 0 06/27/2022   aspirin 81 MG tablet Take 81 mg by mouth daily.    Past Week   atorvastatin (LIPITOR) 40 MG tablet Take 1 tablet (40 mg total) by mouth at bedtime. 90 tablet 1 06/28/2022   Blood Glucose Calibration (TRUE METRIX LEVEL 1) Low SOLN 1 each by In Vitro route daily. 1 each 5 06/28/2022   Blood Glucose Monitoring Suppl (FREESTYLE FREEDOM) KIT by Does not apply route.   06/28/2022   cetirizine (ZYRTEC) 10 MG tablet TAKE 1 TABLET EVERY DAY 90 tablet 1 Past Week   cholecalciferol (VITAMIN D) 1000 UNITS tablet Take 1,000 Units by mouth daily.    Past Week   cholestyramine (QUESTRAN) 4 g packet Take 4 g by mouth 3 (three) times daily with meals.      famotidine (PEPCID) 20 MG tablet Take 20 mg by mouth daily.    Past Week   fluticasone (FLONASE) 50 MCG/ACT nasal spray Place into both nostrils daily.   06/27/2022   glipiZIDE (GLUCOTROL) 10 MG tablet Take 1 tablet (10 mg total) by mouth daily. 90 tablet 1 Past Week   insulin glargine (LANTUS) 100 UNIT/ML Solostar Pen  Inject 7 Units into the skin daily. Up to a max of 30 units daily. 15 mL 6 06/28/2022   Insulin Pen Needle (BD PEN NEEDLE NANO 2ND GEN) 32G X 4 MM MISC USE WITH LANTUS INSULIN FOR 1 INJECTION DAILY. REPLACE NEEDLE FOR EACH INJECTION 100 each 1 06/27/2022   latanoprost (XALATAN) 0.005 % ophthalmic solution Place 1 drop into both eyes at bedtime.    06/27/2022   loratadine (CLARITIN) 5 MG chewable tablet Chew 5 mg by mouth daily.      losartan (COZAAR) 50 MG tablet TAKE 1 TABLET EVERY DAY 90 tablet 3 06/28/2022   metFORMIN (GLUCOPHAGE) 500 MG tablet Take 1 tablet (500 mg total) by mouth daily with breakfast.   Past Week   metoprolol succinate (TOPROL-XL) 25 MG 24 hr tablet Take 1 tablet (25 mg total) by mouth daily. 90 tablet 1 06/28/2022   montelukast (SINGULAIR) 10 MG tablet TAKE 1 TABLET AT BEDTIME 90 tablet 3 Past Week   pioglitazone (ACTOS) 45 MG tablet TAKE 1 TABLET EVERY DAY 90 tablet 3 Past Week   psyllium (METAMUCIL SMOOTH TEXTURE) 28 % packet Take 1 packet by mouth 2 (two) times daily.    Past Week   timolol (TIMOPTIC) 0.5 % ophthalmic solution 1 drop every morning.   Past Week   triamcinolone cream (KENALOG) 0.1 % Apply 1 Application topically 2 (two) times daily.   Past Week  TRUE METRIX BLOOD GLUCOSE TEST test strip USE ONE TIME DAILY AS DIRECTED 100 strip 3 06/27/2022   TRUEplus Lancets 33G MISC USE ONE TIME DAILY AS DIRECTED 100 each 3 06/27/2022     No Known Allergies   Past Medical History:  Diagnosis Date   Colon polyp    Coronary artery disease    Diabetes mellitus without complication (HCC)    Dyspnea    Dysrhythmia    GERD (gastroesophageal reflux disease)    Heart disease    Hemorrhoids    Hyperlipidemia    Hypertension    Myocardial infarction (HCC) 1999    Review of systems:  Otherwise negative.    Physical Exam  Gen: Alert, oriented. Appears stated age.  HEENT: PERRLA. Lungs: No respiratory distress CV: RRR Abd: soft, benign, no masses Ext: No  edema    Planned procedures: Proceed with EGD/colonoscopy. The patient understands the nature of the planned procedure, indications, risks, alternatives and potential complications including but not limited to bleeding, infection, perforation, damage to internal organs and possible oversedation/side effects from anesthesia. The patient agrees and gives consent to proceed.  Please refer to procedure notes for findings, recommendations and patient disposition/instructions.     Carl Bill, MD [facility]

## 2022-06-28 NOTE — Transfer of Care (Signed)
Immediate Anesthesia Transfer of Care Note  Patient: Carl Dominguez  Procedure(s) Performed: COLONOSCOPY WITH PROPOFOL ESOPHAGOGASTRODUODENOSCOPY (EGD) WITH PROPOFOL  Patient Location: PACU  Anesthesia Type:General  Level of Consciousness: drowsy  Airway & Oxygen Therapy: Patient Spontanous Breathing  Post-op Assessment: Report given to RN and Post -op Vital signs reviewed and stable  Post vital signs: Reviewed and stable  Last Vitals:  Vitals Value Taken Time  BP    Temp    Pulse 81 06/28/22 1022  Resp 17 06/28/22 1022  SpO2 95 % 06/28/22 1022  Vitals shown include unvalidated device data.  Last Pain:  Vitals:   06/28/22 0927  TempSrc: Temporal  PainSc: 0-No pain         Complications: No notable events documented.

## 2022-06-28 NOTE — Anesthesia Preprocedure Evaluation (Addendum)
Anesthesia Evaluation  Patient identified by MRN, date of birth, ID band Patient awake    Reviewed: Allergy & Precautions, NPO status , Patient's Chart, lab work & pertinent test results  History of Anesthesia Complications Negative for: history of anesthetic complications  Airway Mallampati: I   Neck ROM: Full    Dental  (+) Missing   Pulmonary former smoker (quit 1984)   Pulmonary exam normal breath sounds clear to auscultation       Cardiovascular hypertension, + CAD (s/p MI and stents)  Normal cardiovascular exam Rhythm:Regular Rate:Normal  Echo 11/13/20: MILD LV SYSTOLIC DYSFUNCTION  NORMAL RIGHT VENTRICULAR SYSTOLIC FUNCTION NO VALVULAR STENOSIS TRIVIAL MR, TR EF 45%    Neuro/Psych negative neurological ROS     GI/Hepatic ,GERD  ,,  Endo/Other  diabetes, Type 2    Renal/GU Renal disease (CKD)     Musculoskeletal   Abdominal   Peds  Hematology negative hematology ROS (+)   Anesthesia Other Findings Cardiology note 05/27/22:  CAD without angina, h/o MI, s/p PCI/stent to OM2 and rPDA (1999), reasonably stable, continue aspirin, atorvastatin and metoprolol therapy. Bradycardia, intermittent, stable, patient is asymptomatic. There is no clear indication for pacemaker placement. Mild LV dysfunction, confirmed by echocardiogram, fairly stable, patient is asymptomatic, continue losartan therapy. Hypertension, well controlled, continue management with metoprolol and losartan. Hypercholesterolemia, reasonable controlled, continue atorvastatin therapy. DM type II, non-insulin-dependent, reasonably controlled, management per PCP. GERD, continue famotidine therapy, management per GI Have patient follow up in 6 months    Reproductive/Obstetrics                             Anesthesia Physical Anesthesia Plan  ASA: 3  Anesthesia Plan: General   Post-op Pain Management:    Induction:  Intravenous  PONV Risk Score and Plan: 2 and Propofol infusion, TIVA and Treatment may vary due to age or medical condition  Airway Management Planned: Natural Airway  Additional Equipment:   Intra-op Plan:   Post-operative Plan:   Informed Consent: I have reviewed the patients History and Physical, chart, labs and discussed the procedure including the risks, benefits and alternatives for the proposed anesthesia with the patient or authorized representative who has indicated his/her understanding and acceptance.       Plan Discussed with: CRNA  Anesthesia Plan Comments: (LMA/GETA backup discussed.  Patient consented for risks of anesthesia including but not limited to:  - adverse reactions to medications - damage to eyes, teeth, lips or other oral mucosa - nerve damage due to positioning  - sore throat or hoarseness - damage to heart, brain, nerves, lungs, other parts of body or loss of life  Informed patient about role of CRNA in peri- and intra-operative care.  Patient voiced understanding.)        Anesthesia Quick Evaluation

## 2022-06-28 NOTE — Interval H&P Note (Signed)
History and Physical Interval Note:  06/28/2022 9:46 AM  Carl Dominguez  has presented today for surgery, with the diagnosis of GERD hx Colnic polyps.  The various methods of treatment have been discussed with the patient and family. After consideration of risks, benefits and other options for treatment, the patient has consented to  Procedure(s): COLONOSCOPY WITH PROPOFOL (N/A) ESOPHAGOGASTRODUODENOSCOPY (EGD) WITH PROPOFOL (N/A) as a surgical intervention.  The patient's history has been reviewed, patient examined, no change in status, stable for surgery.  I have reviewed the patient's chart and labs.  Questions were answered to the patient's satisfaction.     [name]  Ok to proceed with EGD/Colonoscopy

## 2022-06-28 NOTE — Anesthesia Postprocedure Evaluation (Signed)
Anesthesia Post Note  Patient: Carl Dominguez  Procedure(s) Performed: COLONOSCOPY WITH PROPOFOL ESOPHAGOGASTRODUODENOSCOPY (EGD) WITH PROPOFOL  Patient location during evaluation: PACU Anesthesia Type: General Level of consciousness: awake and alert, oriented and patient cooperative Pain management: pain level controlled Vital Signs Assessment: post-procedure vital signs reviewed and stable Respiratory status: spontaneous breathing, nonlabored ventilation and respiratory function stable Cardiovascular status: blood pressure returned to baseline and stable Postop Assessment: adequate PO intake Anesthetic complications: no   No notable events documented.   Last Vitals:  Vitals:   06/28/22 1032 06/28/22 1042  BP: (!) 101/56 104/64  Pulse: 70 75  Resp: 15 15  Temp:    SpO2: 100% 99%    Last Pain:  Vitals:   06/28/22 1042  TempSrc:   PainSc: 0-No pain                 Reed Breech

## 2022-06-28 NOTE — Op Note (Addendum)
[facility] Patient Name: Carl Dominguez Procedure Date: 06/28/2022 9:53 AM MRN: [id number] Account #: [id number] Date of Birth: August 20, 1945 Admit Type: Outpatient Age: 77 Room: [facility] ENDO ROOM 3 Gender: Male Note Status: Finalized Instrument Name: Prentice Docker [id number] Procedure:             Colonoscopy Indications:           Surveillance: Personal history of adenomatous polyps                         on last colonoscopy 5 years ago Providers:             Eather Colas MD, MD Referring MD:          No Local Md, MD (Referring MD) Medicines:             Monitored Anesthesia Care Complications:         No immediate complications. Procedure:             Pre-Anesthesia Assessment:                        - Prior to the procedure, a History and Physical was                         performed, and patient medications and allergies were                         reviewed. The patient is competent. The risks and                         benefits of the procedure and the sedation options and                         risks were discussed with the patient. All questions                         were answered and informed consent was obtained.                         Patient identification and proposed procedure were                         verified by the physician, the nurse, the                         anesthesiologist, the anesthetist and the technician                         in the endoscopy suite. Mental Status Examination:                         alert and oriented. Airway Examination: normal                         oropharyngeal airway and neck mobility. Respiratory                         Examination: clear to auscultation. CV Examination:  normal. Prophylactic Antibiotics: The patient does not                         require prophylactic antibiotics. Prior                         Anticoagulants: The patient has taken no anticoagulant                          or antiplatelet agents. ASA Grade Assessment: III - A                         patient with severe systemic disease. After reviewing                         the risks and benefits, the patient was deemed in                         satisfactory condition to undergo the procedure. The                         anesthesia plan was to use monitored anesthesia care                         (MAC). Immediately prior to administration of                         medications, the patient was re-assessed for adequacy                         to receive sedatives. The heart rate, respiratory                         rate, oxygen saturations, blood pressure, adequacy of                         pulmonary ventilation, and response to care were                         monitored throughout the procedure. The physical                         status of the patient was re-assessed after the                         procedure.                        After obtaining informed consent, the colonoscope was                         passed under direct vision. Throughout the procedure,                         the patient's blood pressure, pulse, and oxygen                         saturations were monitored continuously. The  Colonoscope was introduced through the anus and                         advanced to the the cecum, identified by appendiceal                         orifice and ileocecal valve. The colonoscopy was                         performed without difficulty. The patient tolerated                         the procedure well. The quality of the bowel                         preparation was excellent. The ileocecal valve,                         appendiceal orifice, and rectum were photographed. Findings:      The perianal and digital rectal examinations were normal.      A scar was found in the rectum. Likely from previous hemorrhoid surgery       or banding.       Internal hemorrhoids were found during retroflexion. The hemorrhoids       were Grade II (internal hemorrhoids that prolapse but reduce       spontaneously).      The exam was otherwise without abnormality on direct and retroflexion       views. Impression:            - Scar in the rectum.                        - Internal hemorrhoids.                        - The examination was otherwise normal on direct and                         retroflexion views.                        - No specimens collected. Recommendation:        - Discharge patient to home.                        - Resume previous diet.                        - Continue present medications.                        - Repeat colonoscopy is not recommended due to current                         age (87 years or older) for surveillance.                        - Return to referring physician as previously  scheduled. Procedure Code(s):     --- Professional ---                        W0981, Colorectal cancer screening; colonoscopy on                         individual at high risk Diagnosis Code(s):     --- Professional ---                        Z86.010, Personal history of colonic polyps                        K62.89, Other specified diseases of anus and rectum                        K64.1, Second degree hemorrhoids CPT copyright 2022 American Medical Association. All rights reserved. The codes documented in this report are preliminary and upon coder review may  be revised to meet current compliance requirements. Eather Colas MD, MD 06/28/2022 10:32:02 AM Number of Addenda: 0 Note Initiated On: 06/28/2022 9:53 AM Scope Withdrawal Time: 0 hours 9 minutes 11 seconds  Total Procedure Duration: 0 hours 13 minutes 18 seconds  Estimated Blood Loss:  Estimated blood loss: none.      [facility]

## 2022-06-29 ENCOUNTER — Encounter: Payer: Self-pay | Admitting: Gastroenterology

## 2022-06-29 LAB — SURGICAL PATHOLOGY

## 2022-07-05 NOTE — Progress Notes (Addendum)
I,[name],acting as a scribe for [employer], MD.,have documented all relevant documentation on the behalf of Ronnald Ramp, MD,as directed by  Ronnald Ramp, MD while in the presence of Ronnald Ramp, MD.   Established patient visit   Patient: Carl Dominguez   DOB: 03/30/1945   77 y.o. Male  MRN: [id number] Visit Date: 07/06/2022  Today's healthcare provider: Ronnald Ramp, MD   Chief Complaint  Patient presents with   Follow-Up DM   Subjective    HPI  Diabetes Mellitus Type II, Follow-up  Lab Results  Component Value Date   HGBA1C 8.1 (A) 07/06/2022   HGBA1C 8.2 (A) 04/05/2022   HGBA1C 7.8 (A) 12/29/2021   Wt Readings from Last 3 Encounters:  07/06/22 160 lb 11.2 oz (72.9 kg)  06/28/22 157 lb (71.2 kg)  06/21/22 164 lb (74.4 kg)   Last seen for diabetes 3 months ago.  Management since then includes continue metformin 500mg  daily, actos 45mg  daily, glipizide 10 mg  Increase lantus to 7 units daily.  He reports excellent compliance with treatment. He is not having side effects.   Symptoms: Yes fatigue No foot ulcerations  Yes appetite changes No nausea  No paresthesia of the feet  No polydipsia  No polyuria No visual disturbances   No vomiting     Home blood sugar records:had a glucose that was 87 and he felt slightly lightheaded but did not feel as bad as when it was in the 60s years ago,  reports last night at 11:30 pm it was 90.  Reports that most of the time he checks his glucose its under 120 after eating breakfast  States that he tried taking metformin 500mg  twice a day and it caused GI upset  Episodes of hypoglycemia? No    Current insulin regiment: Lantus 7 units   Pertinent Labs: Lab Results  Component Value Date   CHOL 126 09/15/2020   HDL 50 09/15/2020   LDLCALC 53 09/15/2020   TRIG 134 09/15/2020   CHOLHDL 2.5 09/15/2020   Lab Results  Component Value Date   NA 138  08/11/2021   K 5.2 08/11/2021   CREATININE 1.21 08/11/2021   EGFR 61 08/11/2021   LABMICR <3.0 12/29/2021   MICRALBCREAT <3 12/29/2021     ---------------------------------------------------------------------------------------------------   Reports that hemorrhoids have improved since starting the miralax (2)   Medications: Outpatient Medications Prior to Visit  Medication Sig   acetaminophen (TYLENOL) 500 MG tablet Take 1,000 mg by mouth 2 (two) times daily.   aspirin 81 MG tablet Take 81 mg by mouth daily.    atorvastatin (LIPITOR) 40 MG tablet Take 1 tablet (40 mg total) by mouth at bedtime.   Blood Glucose Calibration (TRUE METRIX LEVEL 1) Low SOLN 1 each by In Vitro route daily.   Blood Glucose Monitoring Suppl (FREESTYLE FREEDOM) KIT by Does not apply route.   cetirizine (ZYRTEC) 10 MG tablet TAKE 1 TABLET EVERY DAY   cholecalciferol (VITAMIN D) 1000 UNITS tablet Take 1,000 Units by mouth daily.    famotidine (PEPCID) 20 MG tablet Take 20 mg by mouth daily.    fluticasone (FLONASE) 50 MCG/ACT nasal spray Place into both nostrils daily.   glipiZIDE (GLUCOTROL) 10 MG tablet Take 1 tablet (10 mg total) by mouth daily.   insulin glargine (LANTUS) 100 UNIT/ML Solostar Pen Inject 7 Units into the skin daily. Up to a max of 30 units daily.   Insulin Pen Needle (BD PEN NEEDLE NANO 2ND  GEN) 32G X 4 MM MISC USE WITH LANTUS INSULIN FOR 1 INJECTION DAILY. REPLACE NEEDLE FOR EACH INJECTION   latanoprost (XALATAN) 0.005 % ophthalmic solution Place 1 drop into both eyes at bedtime.    loratadine (CLARITIN) 5 MG chewable tablet Chew 5 mg by mouth daily.   losartan (COZAAR) 50 MG tablet TAKE 1 TABLET EVERY DAY   metFORMIN (GLUCOPHAGE) 500 MG tablet Take 1 tablet (500 mg total) by mouth daily with breakfast.   metoprolol succinate (TOPROL-XL) 25 MG 24 hr tablet Take 1 tablet (25 mg total) by mouth daily.   montelukast (SINGULAIR) 10 MG tablet TAKE 1 TABLET AT BEDTIME   pioglitazone (ACTOS) 45  MG tablet TAKE 1 TABLET EVERY DAY   polyethylene glycol (MIRALAX / GLYCOLAX) 17 g packet Take 17 g by mouth daily as needed for mild constipation.   psyllium (METAMUCIL SMOOTH TEXTURE) 28 % packet Take 1 packet by mouth 2 (two) times daily.    timolol (TIMOPTIC) 0.5 % ophthalmic solution 1 drop every morning.   triamcinolone cream (KENALOG) 0.1 % Apply 1 Application topically 2 (two) times daily.   TRUE METRIX BLOOD GLUCOSE TEST test strip USE ONE TIME DAILY AS DIRECTED   TRUEplus Lancets 33G MISC USE ONE TIME DAILY AS DIRECTED   [DISCONTINUED] cholestyramine (QUESTRAN) 4 g packet Take 4 g by mouth 3 (three) times daily with meals.   No facility-administered medications prior to visit.    Review of Systems     Objective    BP 119/73 (BP Location: Left Arm, Patient Position: Sitting, Cuff Size: Normal)   Pulse 60   Temp 97.8 F (36.6 C) (Oral)   Resp 16   Ht 5\' 7"  (1.702 m)   Wt 160 lb 11.2 oz (72.9 kg)   BMI 25.17 kg/m    Physical Exam Vitals reviewed.  Constitutional:      General: He is not in acute distress.    Appearance: Normal appearance. He is not ill-appearing, toxic-appearing or diaphoretic.  Eyes:     Conjunctiva/sclera: Conjunctivae normal.  Cardiovascular:     Rate and Rhythm: Normal rate and regular rhythm.     Pulses: Normal pulses.     Heart sounds: Normal heart sounds. No murmur heard.    No friction rub. No gallop.  Pulmonary:     Effort: Pulmonary effort is normal. No respiratory distress.     Breath sounds: Normal breath sounds. No stridor. No wheezing, rhonchi or rales.  Abdominal:     General: Bowel sounds are normal. There is no distension.     Palpations: Abdomen is soft.     Tenderness: There is no abdominal tenderness.  Musculoskeletal:     Right lower leg: No edema.     Left lower leg: No edema.  Skin:    Findings: No erythema or rash.  Neurological:     Mental Status: He is alert and oriented to person, place, and time.        Results for orders placed or performed in visit on 07/06/22  POCT glycosylated hemoglobin (Hb A1C)  Result Value Ref Range   Hemoglobin A1C 8.1 (A) 4.0 - 5.6 %   Est. average glucose Bld gHb Est-mCnc 186     Assessment & Plan     Problem List Items Addressed This Visit       Endocrine   Diabetes mellitus (HCC) - Primary    DM not yet at goal A1c improved but not yet less than 8 Patient would like  to have A1c less than 7  Will continue current regimen Referral for pharmacy consult today  Patient on ACE/ARB, losartan  Patient on statin, atorvastatin   Continue current medications  Discussed dietary recommendations for DM management  Encouraged regular exercise, recommneded 4-5 days of 20-30 mins walks per week           Relevant Orders   POCT glycosylated hemoglobin (Hb A1C) (Completed)   AMB Referral to [facility] ([facility] Patients)     Return in about 3 months (around 10/06/2022) for DM.       The entirety of the information documented in the History of Present Illness, Review of Systems and Physical Exam were personally obtained by me. Portions of this information were initially documented by Hetty Ely, CMA . I, Ronnald Ramp, MD have reviewed the documentation above for thoroughness and accuracy.      Ronnald Ramp, MD  [facility] [phone] (phone) [phone] (fax)  [facility]

## 2022-07-06 ENCOUNTER — Ambulatory Visit (INDEPENDENT_AMBULATORY_CARE_PROVIDER_SITE_OTHER): Payer: Medicare HMO | Admitting: Family Medicine

## 2022-07-06 ENCOUNTER — Encounter: Payer: Self-pay | Admitting: Family Medicine

## 2022-07-06 VITALS — BP 119/73 | HR 60 | Temp 97.8°F | Resp 16 | Ht 67.0 in | Wt 160.7 lb

## 2022-07-06 DIAGNOSIS — Z794 Long term (current) use of insulin: Secondary | ICD-10-CM

## 2022-07-06 DIAGNOSIS — E1165 Type 2 diabetes mellitus with hyperglycemia: Secondary | ICD-10-CM

## 2022-07-06 DIAGNOSIS — E119 Type 2 diabetes mellitus without complications: Secondary | ICD-10-CM

## 2022-07-06 LAB — POCT GLYCOSYLATED HEMOGLOBIN (HGB A1C)
Est. average glucose Bld gHb Est-mCnc: 186
Hemoglobin A1C: 8.1 % — AB (ref 4.0–5.6)

## 2022-07-06 NOTE — Assessment & Plan Note (Signed)
DM not yet at goal A1c improved but not yet less than 8 Patient would like to have A1c less than 7  Will continue current regimen Referral for pharmacy consult today  Patient on ACE/ARB, losartan  Patient on statin, atorvastatin   Continue current medications  Discussed dietary recommendations for DM management  Encouraged regular exercise, recommneded 4-5 days of 20-30 mins walks per week

## 2022-07-09 ENCOUNTER — Telehealth: Payer: Self-pay

## 2022-07-09 NOTE — Progress Notes (Signed)
Care Management & Coordination Services Pharmacy Team Pharmacy Assistant   Name: Carl Dominguez  MRN: [id number] DOB: 24-Sep-1945  Carl Dominguez is an 77 y.o. year old male who will present for his initial Care Coordination visit with the clinical pharmacist Angelena Sole, CPP on 07/13/2022 @ 0900.  Contacted patient to confirm {visittype:27222} appointment with ***, PharmD on *** at ***. {[facility] Outreach:[id number]}  Primary concerns for visit include: ***   Chart review:  Conditions to be addressed/monitored: CAD, HTN, HLD, DMII, GERD, Allergic Rhinitis, Osteoarthritis, and Internal Hemorrhoids, Heart Disease, Arthritis of hand, degenerative, Hypercholesterolemia w/o hypertriglyceridemia,   Recent office visits:  07/06/2022 Carl Ramp, MD (PCP Office Visit) for Follow-up- No medication changes noted, Lab orders placed,   06/21/2022 Carl Merry, MD (PCP Office Visit) for Seasonal Allergic Rhinitis- Started: Amoxicillin 1000 mg twice daily, Stopped: Cholestyramine 4 mg, No orders placed, No follow-up noted  02/132/2024 Carl Grates, LPN (PCP Clinical Support Office Visit) for Medicare Wellness Exam- No medication changes noted, No orders placed  04/05/2022 Carl Ramp, MD (PCP Office Visit) for Follow-up- Changed: Insulin Glargine 5 units daily increased to 7 units daily, Lab order placed, patient to follow-up in 3 months  Recent consult visits:  05/27/2022 Carl Dorthey Sawyer, MD (Cardiology) for Follow-up- No medication changes noted, No orders placed, Patient to follow-up in 6 months  04/07/2022 Carl Lent., MD (Gastroenterology) for Initial Consult- No medication changes noted, No orders placed  Hospital visits:  Medication Reconciliation was completed by comparing discharge summary, patient's EMR and Pharmacy list, and upon discussion with patient.  Admitted to the hospital on 04/29/20124 due to Colonoscopy. Discharge date was  06/28/2022. Discharged from [facility].    New?Medications Started at [facility] Discharge:?? -Started  None ID  Medication Changes at Hospital Discharge: -Changed None ID  Medications Discontinued at Hospital Discharge: -Stopped None ID  Medications that remain the same after Hospital Discharge:??  -All other medications will remain the same.    Medications: Outpatient Encounter Medications as of 07/09/2022  Medication Sig Note   acetaminophen (TYLENOL) 500 MG tablet Take 500 mg by mouth 2 (two) times daily.    aspirin 81 MG tablet Take 81 mg by mouth daily.  11/14/2014: Received from: [facility]   atorvastatin (LIPITOR) 40 MG tablet Take 1 tablet (40 mg total) by mouth at bedtime.    Blood Glucose Calibration (TRUE METRIX LEVEL 1) Low SOLN 1 each by In Vitro route daily.    Blood Glucose Monitoring Suppl (FREESTYLE FREEDOM) KIT by Does not apply route.    cetirizine (ZYRTEC) 10 MG tablet TAKE 1 TABLET EVERY DAY    cholecalciferol (VITAMIN D) 1000 UNITS tablet Take 1,000 Units by mouth daily.  11/14/2014: Received from: [facility]   famotidine (PEPCID) 20 MG tablet Take 20 mg by mouth daily.  11/14/2014: Received from: [facility]   fluticasone (FLONASE) 50 MCG/ACT nasal spray Place into both nostrils daily.    glipiZIDE (GLUCOTROL) 10 MG tablet Take 1 tablet (10 mg total) by mouth daily.    insulin glargine (LANTUS) 100 UNIT/ML Solostar Pen Inject 7 Units into the skin daily. Up to a max of 30 units daily.    Insulin Pen Needle (BD PEN NEEDLE NANO 2ND GEN) 32G X 4 MM MISC USE WITH LANTUS INSULIN FOR 1 INJECTION DAILY. REPLACE NEEDLE FOR EACH INJECTION    latanoprost (XALATAN) 0.005 % ophthalmic solution Place 1 drop into both eyes at bedtime.  11/14/2014:  Received from: [facility]   loratadine (CLARITIN) 5 MG chewable tablet Chew 5 mg by mouth daily.    losartan (COZAAR) 50 MG tablet  TAKE 1 TABLET EVERY DAY    metFORMIN (GLUCOPHAGE) 500 MG tablet Take 1 tablet (500 mg total) by mouth daily with breakfast.    metoprolol succinate (TOPROL-XL) 25 MG 24 hr tablet Take 1 tablet (25 mg total) by mouth daily.    montelukast (SINGULAIR) 10 MG tablet TAKE 1 TABLET AT BEDTIME    pioglitazone (ACTOS) 45 MG tablet TAKE 1 TABLET EVERY DAY    polyethylene glycol (MIRALAX / GLYCOLAX) 17 g packet Take 17 g by mouth daily as needed for mild constipation.    psyllium (METAMUCIL SMOOTH TEXTURE) 28 % packet Take 1 packet by mouth 2 (two) times daily.  11/14/2014: Received from: [facility]   timolol (TIMOPTIC) 0.5 % ophthalmic solution 1 drop every morning.    triamcinolone cream (KENALOG) 0.1 % Apply 1 Application topically 2 (two) times daily.    TRUE METRIX BLOOD GLUCOSE TEST test strip USE ONE TIME DAILY AS DIRECTED    TRUEplus Lancets 33G MISC USE ONE TIME DAILY AS DIRECTED    No facility-administered encounter medications on file as of 07/09/2022.   Do you have any problems getting your medications? {yes/no:20286}  If yes what types of problems are you experiencing? {Problems:27223}  What is your top health concern you would like to discuss at your upcoming visit?   Have you seen any other providers since your last visit with PCP? {yes/no:20286}  Star Rating Drugs:  Atorvastatin 40 mg last filled on 05/31/2022 for a 90-DS with [facility] Glipizide 10 mg last filled on 05/31/2022 for a 90-DS with [facility] Losartan 50 mg last filled on 05/24/2022 for a 90-DS with [facility] Metformin 500 mg last filled on 06/11/2022 for a 90-DS with [facility] Pioglitazone 45 mg last filled on 05/24/2022 for a 90-DS with [facility]  Care Gaps: Annual wellness visit in last year? Yes  If Diabetic: Last eye exam / retinopathy screening: 05/17/2022 Last diabetic foot exam: 12/29/2021   Carl Dominguez, CPA/CMA Clinical Pharmacist Assistant Phone:  [phone]

## 2022-07-13 ENCOUNTER — Ambulatory Visit: Payer: Medicare HMO

## 2022-07-13 DIAGNOSIS — E7800 Pure hypercholesterolemia, unspecified: Secondary | ICD-10-CM

## 2022-07-13 DIAGNOSIS — M531 Cervicobrachial syndrome: Secondary | ICD-10-CM | POA: Diagnosis not present

## 2022-07-13 DIAGNOSIS — E1165 Type 2 diabetes mellitus with hyperglycemia: Secondary | ICD-10-CM

## 2022-07-13 DIAGNOSIS — M5137 Other intervertebral disc degeneration, lumbosacral region: Secondary | ICD-10-CM | POA: Diagnosis not present

## 2022-07-13 DIAGNOSIS — M9901 Segmental and somatic dysfunction of cervical region: Secondary | ICD-10-CM | POA: Diagnosis not present

## 2022-07-13 DIAGNOSIS — M9902 Segmental and somatic dysfunction of thoracic region: Secondary | ICD-10-CM | POA: Diagnosis not present

## 2022-07-13 DIAGNOSIS — E1159 Type 2 diabetes mellitus with other circulatory complications: Secondary | ICD-10-CM

## 2022-07-13 DIAGNOSIS — M9903 Segmental and somatic dysfunction of lumbar region: Secondary | ICD-10-CM | POA: Diagnosis not present

## 2022-07-13 DIAGNOSIS — M546 Pain in thoracic spine: Secondary | ICD-10-CM | POA: Diagnosis not present

## 2022-07-13 NOTE — Patient Instructions (Addendum)
Visit Information It was great speaking with you today!  Please let me know if you have any questions about our visit.  Plan:  Let's plan to switch the timing of your Lantus to once daily before bedtime.  I will look to see if you qualify to use the Dexcom G7 to help you monitor your blood sugar through the day.  Please bring your Dexcom supplies and blood sugar log to your next appointment.   Print copy of patient instructions, educational materials, and care plan provided in person.  Face to Face appointment with pharmacist scheduled for:  08/13/2022 at 1:00 PM   Adelene Idler, CPA/CMA Clinical Pharmacist Assistant  Angelena Sole, PharmD, BCACP, CPP  Clinical Pharmacist Practitioner  [facility]   Phone: [phone]

## 2022-07-13 NOTE — Progress Notes (Signed)
Care Management & Coordination Services Pharmacy Note  07/13/2022 Name:  Carl Dominguez MRN:  [id number] DOB:  1945-07-18  Summary: Patient presents for initial consult.   -Patient reports frustrations with his blood sugars and inability to keep his sugars under control throughout the day. He notes his blood sugars tend to be highest overnight and in the AM   -Given patient's challenges with constipation and poor appetite, he may not be a great candidate for GLP-1 agonist.   Recommendations/Changes made from today's visit: -START documentation for Dexcom G7 system.  -SWITCH Lantus to 7 units at bedtime to better cover evening spikes.  -Goal: Patient will monitor blood sugar once daily before breakfast and record. Patient will also co-ordinate with [employer] regarding first delivery of Dexcom G7.   Follow up plan: -HC assessment in 2 weeks to review blood sugars and ensure Dexcom order is processing.  -CPP follow-up 1 month for Dexcom training   Subjective: Carl Dominguez is an 77 y.o. year old male who is a primary patient of Simmons-Robinson, Tawanna Cooler, MD.  The care coordination team was consulted for assistance with disease management and care coordination needs.    Engaged with patient face to face for initial visit.  Recent office visits: 07/06/2022 Ronnald Ramp, MD (PCP Office Visit) for Follow-up- No medication changes noted, Lab orders placed,    06/21/2022 Mila Merry, MD (PCP Office Visit) for Seasonal Allergic Rhinitis- Started: Amoxicillin 1000 mg twice daily, Stopped: Cholestyramine 4 mg, No orders placed, No follow-up noted   02/132/2024 Albertine Grates, LPN (PCP Clinical Support Office Visit) for Medicare Wellness Exam- No medication changes noted, No orders placed   04/05/2022 Ronnald Ramp, MD (PCP Office Visit) for Follow-up- Changed: Insulin Glargine 5 units daily increased to 7 units daily, Lab order placed, patient to follow-up  in 3 months  Recent consult visits: 05/27/2022 Dwayne Dorthey Sawyer, MD (Cardiology) for Follow-up- No medication changes noted, No orders placed, Patient to follow-up in 6 months   04/07/2022 Darryl Lent., MD (Gastroenterology) for Initial Consult- No medication changes noted, No orders placed  Hospital visits: Admitted to the hospital on 04/29/20124 due to Colonoscopy. Discharge date was 06/28/2022. Discharged from [facility].    Objective:  Lab Results  Component Value Date   CREATININE 1.21 08/11/2021   BUN 26 08/11/2021   EGFR 61 08/11/2021   GFRNONAA 58 (L) 04/24/2020   GFRAA 67 04/24/2020   NA 138 08/11/2021   K 5.2 08/11/2021   CALCIUM 9.3 08/11/2021   CO2 21 08/11/2021   GLUCOSE 226 (H) 08/11/2021    Lab Results  Component Value Date/Time   HGBA1C 8.1 (A) 07/06/2022 08:42 AM   HGBA1C 8.2 (A) 04/05/2022 08:29 AM   HGBA1C 7.1 (H) 05/20/2021 08:51 AM   HGBA1C 7.4 (H) 09/15/2020 09:22 AM   MICROALBUR 20 11/23/2017 09:24 AM   MICROALBUR negative 11/15/2016 08:45 AM    Last diabetic Eye exam:  Lab Results  Component Value Date/Time   HMDIABEYEEXA No Retinopathy 05/17/2022 12:00 AM    Last diabetic Foot exam: No results found for: "HMDIABFOOTEX"   Lab Results  Component Value Date   CHOL 126 09/15/2020   HDL 50 09/15/2020   LDLCALC 53 09/15/2020   TRIG 134 09/15/2020   CHOLHDL 2.5 09/15/2020       Latest Ref Rng & Units 08/11/2021    2:26 PM 09/15/2020    9:22 AM 04/24/2020    8:41 AM  Hepatic Function  Total Protein 6.0 - 8.5 g/dL 6.3  6.2  6.9   Albumin 3.7 - 4.7 g/dL 4.4  3.9  4.5   AST 0 - 40 IU/L 17  15  20    ALT 0 - 44 IU/L 16  18  14    Alk Phosphatase 44 - 121 IU/L 83  75  70   Total Bilirubin 0.0 - 1.2 mg/dL 0.8  0.9  1.3     Lab Results  Component Value Date/Time   TSH 1.400 08/11/2021 02:26 PM   TSH 3.760 09/15/2020 09:22 AM       Latest Ref Rng & Units 08/11/2021    2:26 PM 05/20/2021     8:51 AM 09/15/2020    9:22 AM  CBC  WBC 3.4 - 10.8 x10E3/uL 8.1  8.3  11.6   Hemoglobin 13.0 - 17.7 g/dL 11.9  14.7  82.9   Hematocrit 37.5 - 51.0 % 38.2  41.4  38.4   Platelets 150 - 450 x10E3/uL 170  160  166     No results found for: "VD25OH", "VITAMINB12"  Clinical ASCVD: Yes  The ASCVD Risk score (Arnett DK, et al., 2019) failed to calculate for the following reasons:   The patient has a prior MI or stroke diagnosis       07/06/2022    8:44 AM 06/21/2022   10:03 AM 04/13/2022    1:12 PM  Depression screen PHQ 2/9  Decreased Interest 0 0 0  Down, Depressed, Hopeless 0 0 0  PHQ - 2 Score 0 0 0  Altered sleeping 0 0 0  Tired, decreased energy 0 0 0  Change in appetite 0 0 0  Feeling bad or failure about yourself  0 0 0  Trouble concentrating 0 0 0  Moving slowly or fidgety/restless 0 0 0  Suicidal thoughts 0 0 0  PHQ-9 Score 0 0 0  Difficult doing work/chores Not difficult at all Not difficult at all Not difficult at all     Social History   Tobacco Use  Smoking Status Former   Types: Cigarettes   Quit date: 03/01/1982   Years since quitting: 40.3  Smokeless Tobacco Never  Tobacco Comments   has been quit for 30 years   BP Readings from Last 3 Encounters:  07/06/22 119/73  06/28/22 104/64  06/21/22 139/74   Pulse Readings from Last 3 Encounters:  07/06/22 60  06/28/22 75  06/21/22 70   Wt Readings from Last 3 Encounters:  07/06/22 160 lb 11.2 oz (72.9 kg)  06/28/22 157 lb (71.2 kg)  06/21/22 164 lb (74.4 kg)   BMI Readings from Last 3 Encounters:  07/06/22 25.17 kg/m  06/28/22 24.59 kg/m  06/21/22 25.69 kg/m    No Known Allergies  Medications Reviewed Today     Reviewed by Ronnald Ramp, MD (Physician) on 07/06/22 at [id number]  Med List Status: <None>   Medication Order Taking? Sig Documenting Provider Last Dose Status Informant  acetaminophen (TYLENOL) 500 MG tablet [id number]  Take 500 mg by mouth 2 (two) times daily. [provider]  Active   aspirin 81 MG tablet [id number]  Take 81 mg by mouth daily.  [provider]  Active            Med Note Cyndia Bent   Thu Nov 14, 2014  8:06 AM) Received from: [facility]  atorvastatin (LIPITOR) 40 MG tablet [id number]  Take 1 tablet (40 mg total) by mouth at bedtime. Simmons-Robinson, [place],  MD  Active   Blood Glucose Calibration (TRUE METRIX LEVEL 1) Low SOLN [id number]  1 each by In Vitro route daily. Bosie Clos, MD  Active   Blood Glucose Monitoring Suppl (FREESTYLE FREEDOM) KIT [id number]  by Does not apply route. [provider]  Active   cetirizine (ZYRTEC) 10 MG tablet [id number]  TAKE 1 TABLET EVERY DAY Bosie Clos, MD  Active   cholecalciferol (VITAMIN D) 1000 UNITS tablet [id number]  Take 1,000 Units by mouth daily.  [provider]  Active            Med Note Cyndia Bent   Thu Nov 14, 2014  8:06 AM) Received from: [facility]  famotidine (PEPCID) 20 MG tablet [id number]  Take 20 mg by mouth daily.  [provider]  Active            Med Note Cyndia Bent   Thu Nov 14, 2014  8:06 AM) Received from: [facility]  fluticasone [facility]) 50 MCG/ACT nasal spray [id number]  Place into both nostrils daily. [provider]  Active   glipiZIDE (GLUCOTROL) 10 MG tablet [id number]  Take 1 tablet (10 mg total) by mouth daily. Simmons-Robinson, [place], MD  Active   insulin glargine (LANTUS) 100 UNIT/ML Solostar Pen [id number]  Inject 7 Units into the skin daily. Up to a max of 30 units daily. Simmons-Robinson, [place], MD  Active   Insulin Pen Needle (BD PEN NEEDLE NANO 2ND GEN) 32G X 4 MM MISC [id number]  USE WITH LANTUS INSULIN FOR 1 INJECTION DAILY. REPLACE NEEDLE FOR EACH INJECTION Alfredia Ferguson, PA-C  Active   latanoprost (XALATAN) 0.005 % ophthalmic solution [id number]  Place 1 drop into both eyes at bedtime.  [provider]  Active             Med Note Cyndia Bent   Thu Nov 14, 2014  8:06 AM) Received from: [facility]  loratadine (CLARITIN) 5 MG chewable tablet [id number]  Chew 5 mg by mouth daily. [provider]  Active   losartan (COZAAR) 50 MG tablet [id number]  TAKE 1 TABLET EVERY DAY Simmons-Robinson, [place], MD  Active   metFORMIN (GLUCOPHAGE) 500 MG tablet [id number]  Take 1 tablet (500 mg total) by mouth daily with breakfast. Simmons-Robinson, [place], MD  Active   metoprolol succinate (TOPROL-XL) 25 MG 24 hr tablet [id number]  Take 1 tablet (25 mg total) by mouth daily. Simmons-Robinson, Tawanna Cooler, MD  Active   montelukast (SINGULAIR) 10 MG tablet [id number]  TAKE 1 TABLET AT BEDTIME Bosie Clos, MD  Active   pioglitazone (ACTOS) 45 MG tablet [id number]  TAKE 1 TABLET EVERY DAY Simmons-Robinson, [place], MD  Active   polyethylene glycol (MIRALAX / GLYCOLAX) 17 g packet [id number] Yes Take 17 g by mouth daily as needed for mild constipation. [provider]  Active   psyllium (METAMUCIL SMOOTH TEXTURE) 28 % packet [id number]  Take 1 packet by mouth 2 (two) times daily.  [provider]  Active            Med Note Cyndia Bent   Thu Nov 14, 2014  8:06 AM) Received from: [facility]  timolol (TIMOPTIC) 0.5 % ophthalmic solution [id number]  1 drop every morning. [provider]  Active   triamcinolone cream (KENALOG) 0.1 % [id number]  Apply 1 Application topically 2 (two) times daily. [provider]  Active   TRUE METRIX BLOOD GLUCOSE TEST test strip [id number]  USE ONE TIME DAILY AS DIRECTED Bosie Clos, MD  Active   TRUEplus Lancets 33G MISC [id number]  USE ONE TIME DAILY AS DIRECTED Bosie Clos, MD  Active             SDOH:  (Social Determinants of Health) assessments and interventions performed: Yes SDOH Interventions    Flowsheet Row [facility]  Visit from 08/26/2021 in [facility] Office Visit from 05/20/2021 in [facility] Clinical Support from 04/09/2021 in [facility] Clinical Support from 04/03/2020 in [facility]  SDOH Interventions       Food Insecurity Interventions Intervention Not Indicated -- -- Intervention Not Indicated --  Housing Interventions -- -- -- Intervention Not Indicated --  Transportation Interventions Intervention Not Indicated -- -- Intervention Not Indicated --  Alcohol Usage Interventions Intervention Not Indicated (Score <7) -- -- -- --  Depression Interventions/Treatment  -- PHQ2-9 Score <4 Follow-up Not Indicated PHQ2-9 Score <4 Follow-up Not Indicated -- --  Financial Strain Interventions Intervention Not Indicated -- -- Intervention Not Indicated --  Physical Activity Interventions Intervention Not Indicated -- -- Intervention Not Indicated Patient Refused  Stress Interventions Intervention Not Indicated -- -- Intervention Not Indicated --  Social Connections Interventions Intervention Not Indicated -- -- Intervention Not Indicated --       Medication Assistance: None required.  Patient affirms current coverage meets needs.  Medication Access: Within the past 30 days, how often has patient missed a dose of medication? None Is a pillbox or other method used to improve adherence? No  Factors that may affect medication adherence? lack of understanding of disease management and hearing impairment Are meds synced by current pharmacy? No  Are meds delivered by current pharmacy? Yes  Does patient experience delays in picking up medications due to transportation concerns? No   [facility] Reviewed: Is patient disadvantaged to use [facility]?: Yes  Current Rx insurance plan: [employer] Name and location of Current pharmacy:  [facility] - [place], [place] - [address] [place] [address] Phone: [phone] Fax: [phone]  [id number] [place], [place] - [address] AT [facility] [address] [place] [address] Phone: [phone] Fax: [phone]  [facility] services reviewed with patient today?: Yes  Patient requests to transfer care to [facility]?: No  Reason patient declined to change pharmacies: Disadvantaged due to insurance/mail order  Compliance/Adherence/Medication fill history: Care Gaps: COVID-19 Vaccine   Star-Rating Drugs: Atorvastatin 40 mg last filled on 05/31/2022 for a 90-DS with [facility] Glipizide 10 mg last filled on 05/31/2022 for a 90-DS with [facility] Losartan 50 mg last filled on 05/24/2022 for a 90-DS with [facility] Metformin 500 mg last filled on 06/11/2022 for a 90-DS with [facility] Pioglitazone 45 mg last filled on 05/24/2022 for a 90-DS with [facility]  Assessment/Plan   Hypertension (BP goal <130/80) -Controlled -Current treatment: Losartan 50 mg daily -Medications previously tried: NA  -Denies hypotensive/hypertensive symptoms -Recommended to continue current medication  Hyperlipidemia: (LDL goal < 70) -Controlled -Current treatment: Atorvastatin 40 mg  Aspirin 81 mg  -Medications previously tried: NA  -Recommended to continue current medication  Diabetes (A1c goal <8% within 3 months) -Uncontrolled -Diagnosed 1980-1990s, insulin dependent for 5+ years  -Current medications: Glipizide 10 mg daily Lantus 7 units daily  Metformin 500  mg daily Pioglitazone 45 mg  -Medications previously tried: NA  -Denies hypoglycemic/hyperglycemic symptoms -Current exercise: Active most days with yard work, gardening. Tends to be more inactive during the winter.  -Patient reports frustrations with his blood sugars and inability to keep his sugars under control throughout the day. He notes his blood sugars tend to be highest  overnight and in the AM  -Given patient's challenges with constipation and poor appetite, he may not be a great candidate for GLP-1 agonist.  -START documentation for Dexcom G7 system.  -SWITCH Lantus to 7 units at bedtime to better cover evening spikes.  -Goal: Patient to monitor blood sugar once daily before breakfast and record.  -HC assessment in 2 weeks to review blood sugars and ensure Dexcom order is processing. Patient will also co-ordinate with [employer] regarding first delivery of Dexcom G7.  -CPP follow-up 1 month for Dexcom training   Follow Up Plan: Face to Face appointment with care management team member scheduled for:  08/13/2022 at 1:00 PM   Angelena Sole, PharmD, Patsy Baltimore, CPP  Clinical Pharmacist Practitioner  [facility] [phone]

## 2022-07-16 DIAGNOSIS — Z794 Long term (current) use of insulin: Secondary | ICD-10-CM | POA: Diagnosis not present

## 2022-07-16 DIAGNOSIS — E1165 Type 2 diabetes mellitus with hyperglycemia: Secondary | ICD-10-CM | POA: Diagnosis not present

## 2022-07-20 ENCOUNTER — Ambulatory Visit: Payer: Medicare HMO

## 2022-07-20 DIAGNOSIS — E0822 Diabetes mellitus due to underlying condition with diabetic chronic kidney disease: Secondary | ICD-10-CM

## 2022-07-20 NOTE — Patient Instructions (Addendum)
Visit Information It was great speaking with you today!  Please let me know if you have any questions about our visit.  Plan:  Use your Dexcom G7 to monitor your blood sugars. In 10 days, you will need to apply a new sensor. Please call the office if you have any difficulty with using the sensor.  For a few days before our next appointment, please keep a log of what kind of foods you are eating (breakfast, lunch, dinner, and snacks).   Print copy of patient instructions, educational materials, and care plan provided in person.  Face to Face appointment with pharmacist scheduled for:  08/09/2022 at 10:00 AM  Angelena Sole, PharmD, Patsy Baltimore, CPP  Clinical Pharmacist Practitioner  [facility] [phone]

## 2022-07-20 NOTE — Progress Notes (Signed)
Care Management & Coordination Services Pharmacy Note  07/20/2022 Name:  GAYLEN MENTINK MRN:  [id number] DOB:  Feb 15, 1946  Summary: Patient presents for follow-up consult.  -Patient received Dexcom G7 and was given extensive training on the system today.   Recommendations/Changes made from today's visit: -Continue current medications  Follow up plan: -CPP Follow-up 2 weeks   Subjective: Carl Dominguez is an 77 y.o. year old male who is a primary patient of Simmons-Robinson, Tawanna Cooler, MD.  The care coordination team was consulted for assistance with disease management and care coordination needs.    Engaged with patient face to face for follow up visit.  Recent office visits: 07/06/2022 Ronnald Ramp, MD (PCP Office Visit) for Follow-up- No medication changes noted, Lab orders placed,    06/21/2022 Mila Merry, MD (PCP Office Visit) for Seasonal Allergic Rhinitis- Started: Amoxicillin 1000 mg twice daily, Stopped: Cholestyramine 4 mg, No orders placed, No follow-up noted   02/132/2024 Albertine Grates, LPN (PCP Clinical Support Office Visit) for Medicare Wellness Exam- No medication changes noted, No orders placed   04/05/2022 Ronnald Ramp, MD (PCP Office Visit) for Follow-up- Changed: Insulin Glargine 5 units daily increased to 7 units daily, Lab order placed, patient to follow-up in 3 months  Recent consult visits: 05/27/2022 Dwayne Dorthey Sawyer, MD (Cardiology) for Follow-up- No medication changes noted, No orders placed, Patient to follow-up in 6 months   04/07/2022 Darryl Lent., MD (Gastroenterology) for Initial Consult- No medication changes noted, No orders placed  Hospital visits: Admitted to the hospital on 04/29/20124 due to Colonoscopy. Discharge date was 06/28/2022. Discharged from [facility].    Objective:  Lab Results  Component Value Date   CREATININE 1.21 08/11/2021   BUN 26 08/11/2021    EGFR 61 08/11/2021   GFRNONAA 58 (L) 04/24/2020   GFRAA 67 04/24/2020   NA 138 08/11/2021   K 5.2 08/11/2021   CALCIUM 9.3 08/11/2021   CO2 21 08/11/2021   GLUCOSE 226 (H) 08/11/2021    Lab Results  Component Value Date/Time   HGBA1C 8.1 (A) 07/06/2022 08:42 AM   HGBA1C 8.2 (A) 04/05/2022 08:29 AM   HGBA1C 7.1 (H) 05/20/2021 08:51 AM   HGBA1C 7.4 (H) 09/15/2020 09:22 AM   MICROALBUR 20 11/23/2017 09:24 AM   MICROALBUR negative 11/15/2016 08:45 AM    Last diabetic Eye exam:  Lab Results  Component Value Date/Time   HMDIABEYEEXA No Retinopathy 05/17/2022 12:00 AM    Last diabetic Foot exam: No results found for: "HMDIABFOOTEX"   Lab Results  Component Value Date   CHOL 126 09/15/2020   HDL 50 09/15/2020   LDLCALC 53 09/15/2020   TRIG 134 09/15/2020   CHOLHDL 2.5 09/15/2020       Latest Ref Rng & Units 08/11/2021    2:26 PM 09/15/2020    9:22 AM 04/24/2020    8:41 AM  Hepatic Function  Total Protein 6.0 - 8.5 g/dL 6.3  6.2  6.9   Albumin 3.7 - 4.7 g/dL 4.4  3.9  4.5   AST 0 - 40 IU/L 17  15  20    ALT 0 - 44 IU/L 16  18  14    Alk Phosphatase 44 - 121 IU/L 83  75  70   Total Bilirubin 0.0 - 1.2 mg/dL 0.8  0.9  1.3     Lab Results  Component Value Date/Time   TSH 1.400 08/11/2021 02:26 PM   TSH 3.760 09/15/2020 09:22 AM  Latest Ref Rng & Units 08/11/2021    2:26 PM 05/20/2021    8:51 AM 09/15/2020    9:22 AM  CBC  WBC 3.4 - 10.8 x10E3/uL 8.1  8.3  11.6   Hemoglobin 13.0 - 17.7 g/dL 16.1  09.6  04.5   Hematocrit 37.5 - 51.0 % 38.2  41.4  38.4   Platelets 150 - 450 x10E3/uL 170  160  166     No results found for: "VD25OH", "VITAMINB12"  Clinical ASCVD: Yes  The ASCVD Risk score (Arnett DK, et al., 2019) failed to calculate for the following reasons:   The patient has a prior MI or stroke diagnosis       07/06/2022    8:44 AM 06/21/2022   10:03 AM 04/13/2022    1:12 PM  Depression screen PHQ 2/9  Decreased Interest 0 0 0  Down, Depressed, Hopeless 0 0  0  PHQ - 2 Score 0 0 0  Altered sleeping 0 0 0  Tired, decreased energy 0 0 0  Change in appetite 0 0 0  Feeling bad or failure about yourself  0 0 0  Trouble concentrating 0 0 0  Moving slowly or fidgety/restless 0 0 0  Suicidal thoughts 0 0 0  PHQ-9 Score 0 0 0  Difficult doing work/chores Not difficult at all Not difficult at all Not difficult at all     Social History   Tobacco Use  Smoking Status Former   Types: Cigarettes   Quit date: 03/01/1982   Years since quitting: 40.4  Smokeless Tobacco Never  Tobacco Comments   has been quit for 30 years   BP Readings from Last 3 Encounters:  07/06/22 119/73  06/28/22 104/64  06/21/22 139/74   Pulse Readings from Last 3 Encounters:  07/06/22 60  06/28/22 75  06/21/22 70   Wt Readings from Last 3 Encounters:  07/06/22 160 lb 11.2 oz (72.9 kg)  06/28/22 157 lb (71.2 kg)  06/21/22 164 lb (74.4 kg)   BMI Readings from Last 3 Encounters:  07/06/22 25.17 kg/m  06/28/22 24.59 kg/m  06/21/22 25.69 kg/m    No Known Allergies  Medications Reviewed Today     Reviewed by Gaspar Cola, RPH (Pharmacist) on 07/13/22 at 0936  Med List Status: <None>   Medication Order Taking? Sig Documenting Provider Last Dose Status Informant  acetaminophen (TYLENOL) 500 MG tablet [id number] Yes Take 1,000 mg by mouth 2 (two) times daily. [provider] Taking Active   aspirin 81 MG tablet [id number] Yes Take 81 mg by mouth daily.  [provider] Taking Active            Med Note Cyndia Bent   Thu Nov 14, 2014  8:06 AM) Received from: [employer]  atorvastatin (LIPITOR) 40 MG tablet [id number] Yes Take 1 tablet (40 mg total) by mouth at bedtime. Simmons-Robinson, Makiera, MD Taking Active   Blood Glucose Calibration (TRUE METRIX LEVEL 1) Low SOLN [id number]  1 each by In Vitro route daily. Bosie Clos, MD  Active   Blood Glucose Monitoring Suppl (FREESTYLE FREEDOM) KIT [id number]  by Does not  apply route. [provider]  Active   cetirizine (ZYRTEC) 10 MG tablet [id number] Yes TAKE 1 TABLET EVERY DAY Bosie Clos, MD Taking Active   cholecalciferol (VITAMIN D) 1000 UNITS tablet [id number] Yes Take 1,000 Units by mouth daily.  [provider] Taking Active  Med Note Cyndia Bent   Thu Nov 14, 2014  8:06 AM) Received from: [facility]  famotidine (PEPCID) 20 MG tablet [id number]  Take 20 mg by mouth daily.  [provider]  Active            Med Note Cyndia Bent   Thu Nov 14, 2014  8:06 AM) Received from: [facility]  fluticasone [facility]) 50 MCG/ACT nasal spray [id number] Yes Place into both nostrils daily. [provider] Taking Active   glipiZIDE (GLUCOTROL) 10 MG tablet [id number] Yes Take 1 tablet (10 mg total) by mouth daily. Simmons-Robinson, Makiera, MD Taking Active   insulin glargine (LANTUS) 100 UNIT/ML Solostar Pen [id number] Yes Inject 7 Units into the skin daily. Up to a max of 30 units daily. Simmons-Robinson, Makiera, MD Taking Active   Insulin Pen Needle (BD PEN NEEDLE NANO 2ND GEN) 32G X 4 MM MISC [id number]  USE WITH LANTUS INSULIN FOR 1 INJECTION DAILY. REPLACE NEEDLE FOR EACH INJECTION Alfredia Ferguson, PA-C  Active   Lactobacillus (PROBIOTIC ACIDOPHILUS PO) [id number] Yes Take 1 tablet by mouth daily. [provider] Taking Active   latanoprost (XALATAN) 0.005 % ophthalmic solution [id number]  Place 1 drop into both eyes at bedtime.  [provider]  Active            Med Note Cyndia Bent   Thu Nov 14, 2014  8:06 AM) Received from: [employer]  loratadine (CLARITIN) 5 MG chewable tablet [id number]  Chew 5 mg by mouth daily. [provider]  Active   losartan (COZAAR) 50 MG tablet [id number] Yes TAKE 1 TABLET EVERY DAY Simmons-Robinson, Makiera, MD Taking Active   metFORMIN (GLUCOPHAGE) 500 MG tablet [id number] Yes Take 1 tablet (500  mg total) by mouth daily with breakfast. Simmons-Robinson, Makiera, MD Taking Active   metoprolol succinate (TOPROL-XL) 25 MG 24 hr tablet [id number] Yes Take 1 tablet (25 mg total) by mouth daily. Simmons-Robinson, Makiera, MD Taking Active   montelukast (SINGULAIR) 10 MG tablet [id number] Yes TAKE 1 TABLET AT BEDTIME Bosie Clos, MD Taking Active   pioglitazone (ACTOS) 45 MG tablet [id number] Yes TAKE 1 TABLET EVERY DAY Simmons-Robinson, Makiera, MD Taking Active   polyethylene glycol (MIRALAX / GLYCOLAX) 17 g packet [id number] Yes Take 17 g by mouth daily as needed for mild constipation. [provider] Taking Active   psyllium (METAMUCIL SMOOTH TEXTURE) 28 % packet [id number] Yes Take 1 packet by mouth 2 (two) times daily.  [provider] Taking Active            Med Note Cyndia Bent   Thu Nov 14, 2014  8:06 AM) Received from: [facility]  timolol (TIMOPTIC) 0.5 % ophthalmic solution [id number]  1 drop every morning. [provider]  Active   triamcinolone cream (KENALOG) 0.1 % [id number]  Apply 1 Application topically 2 (two) times daily. [provider]  Active   TRUE METRIX BLOOD GLUCOSE TEST test strip [id number]  USE ONE TIME DAILY AS DIRECTED Bosie Clos, MD  Active   TRUEplus Lancets 33G MISC [id number]  USE ONE TIME DAILY AS DIRECTED Bosie Clos, MD  Active             SDOH:  (Social Determinants of Health) assessments and interventions performed: Yes SDOH Interventions    Flowsheet Row Care Coordination from 07/13/2022 in [facility] Clinical Support from 04/13/2022 in [facility] Office Visit from 08/26/2021  in [facility] Office Visit from 05/20/2021 in [facility] Clinical Support from 04/09/2021 in [facility] Clinical Support from 04/03/2020 in [facility]  SDOH  Interventions        Food Insecurity Interventions -- Intervention Not Indicated -- -- Intervention Not Indicated --  Housing Interventions -- -- -- -- Intervention Not Indicated --  Transportation Interventions Intervention Not Indicated Intervention Not Indicated -- -- Intervention Not Indicated --  Alcohol Usage Interventions -- Intervention Not Indicated (Score <7) -- -- -- --  Depression Interventions/Treatment  -- -- PHQ2-9 Score <4 Follow-up Not Indicated PHQ2-9 Score <4 Follow-up Not Indicated -- --  Financial Strain Interventions Intervention Not Indicated Intervention Not Indicated -- -- Intervention Not Indicated --  Physical Activity Interventions -- Intervention Not Indicated -- -- Intervention Not Indicated Patient Refused  Stress Interventions -- Intervention Not Indicated -- -- Intervention Not Indicated --  Social Connections Interventions -- Intervention Not Indicated -- -- Intervention Not Indicated --       Medication Assistance: None required.  Patient affirms current coverage meets needs.  Medication Access: Within the past 30 days, how often has patient missed a dose of medication? None Is a pillbox or other method used to improve adherence? No  Factors that may affect medication adherence? lack of understanding of disease management and hearing impairment Are meds synced by current pharmacy? No  Are meds delivered by current pharmacy? Yes  Does patient experience delays in picking up medications due to transportation concerns? No   [facility] Reviewed: Is patient disadvantaged to use [facility]?: Yes  Current Rx insurance plan: [employer] Name and location of Current pharmacy:  [facility] - [place], [place] - [address] [place] [address] Phone: [phone] Fax: [phone]  [id number] [place], [place] - [address] AT [facility] [address] [place] [address] Phone:  [phone] Fax: [phone]  [facility] services reviewed with patient today?: Yes  Patient requests to transfer care to [facility]?: No  Reason patient declined to change pharmacies: Disadvantaged due to insurance/mail order  Compliance/Adherence/Medication fill history: Care Gaps: COVID-19 Vaccine   [facility]: Atorvastatin 40 mg last filled on 05/31/2022 for a 90-DS with [facility] Glipizide 10 mg last filled on 05/31/2022 for a 90-DS with [facility] Losartan 50 mg last filled on 05/24/2022 for a 90-DS with [facility] Metformin 500 mg last filled on 06/11/2022 for a 90-DS with [facility] Pioglitazone 45 mg last filled on 05/24/2022 for a 90-DS with [facility]  Assessment/Plan   Hypertension (BP goal <130/80) -Controlled -Current treatment: Losartan 50 mg daily -Medications previously tried: NA  -Denies hypotensive/hypertensive symptoms -Recommended to continue current medication  Hyperlipidemia: (LDL goal < 70) -Controlled -Current treatment: Atorvastatin 40 mg  Aspirin 81 mg  -Medications previously tried: NA  -Recommended to continue current medication  Diabetes (A1c goal <8% within 3 months) -Uncontrolled -Diagnosed 1980-1990s, insulin dependent for 5+ years  -Current medications: Glipizide 10 mg daily Lantus 7 units nightly  Metformin 500 mg daily Pioglitazone 45 mg  -Medications previously tried: NA  -Denies hypoglycemic/hyperglycemic symptoms -Current meal patterns: breakfast: 2 scrambled eggs, 2 slices of bacon, grits  -Current exercise: Active most days with yard work, gardening. Tends to be more inactive during the winter.  -Patient reports frustrations with his blood sugars and inability to keep his sugars under control throughout the  day. He notes his blood sugars tend to be highest overnight and in the AM  -Given patient's challenges with constipation and poor appetite, he may not be a great candidate for  GLP-1 agonist. -Patient received Dexcom G7 and was given extensive training on the system today.  -Goal: Patient to monitor blood sugar once daily before breakfast and record.  -HC assessment in 2 weeks to review blood sugars and ensure Dexcom order is processing. Patient will also co-ordinate with [employer] regarding first delivery of Dexcom G7.  -CPP follow-up 1 month for Dexcom training   Follow Up Plan: Face to Face appointment with care management team member scheduled for:  08/09/2022 at 10:00 AM   Angelena Sole, PharmD, Patsy Baltimore, CPP  Clinical Pharmacist Practitioner  [facility] [phone]

## 2022-07-22 ENCOUNTER — Telehealth: Payer: Self-pay | Admitting: Family Medicine

## 2022-07-22 ENCOUNTER — Other Ambulatory Visit: Payer: Self-pay | Admitting: Family Medicine

## 2022-07-22 ENCOUNTER — Other Ambulatory Visit: Payer: Self-pay

## 2022-07-22 MED ORDER — TRUEPLUS LANCETS 33G MISC: 6 refills | Status: DC

## 2022-07-22 MED ORDER — METFORMIN HCL 500 MG PO TABS: 500.0000 mg | ORAL_TABLET | Freq: Every day | ORAL | Status: DC

## 2022-07-22 MED ORDER — TRUE METRIX BLOOD GLUCOSE TEST VI STRP: ORAL_STRIP | 6 refills | Status: DC

## 2022-07-22 NOTE — Telephone Encounter (Signed)
[facility] pharmacy faxed refill request for the following medications:   metFORMIN (GLUCOPHAGE) 500 MG tablet    Please advise

## 2022-07-29 ENCOUNTER — Other Ambulatory Visit: Payer: Self-pay

## 2022-07-29 MED ORDER — METFORMIN HCL 500 MG PO TABS: 500.0000 mg | ORAL_TABLET | Freq: Every day | ORAL | 1 refills | Status: DC

## 2022-08-02 ENCOUNTER — Telehealth: Payer: Self-pay

## 2022-08-02 NOTE — Progress Notes (Signed)
Care Coordination Pharmacy Assistant   Name: Carl Dominguez  MRN: [id number] DOB: 12/12/1945  Reason for Encounter: Vira Agar G7 follow-up   I received a task from Angelena Sole, CPP requesting that I contact the patient regarding the following:  Check in with patient about how things are going with his Dexcom G7. Please ask patient if he is having any challenges or concerns since he has started using the device. Is it working well for him? Ask if patient has any questions regarding how to replace the sensor, if he does, he can schedule a visit with one of the nurses at the clinic and they can help him"  I spoke with the patient and he reports that he has been having some issues with keeping the Dexcom on. Per patient the 1st placement sensor that Alex placed in his arm for him it was in a bad spot on his arm as he kept bumping it on his door. Patient reports that the 2nd sensor that he placed he got a error failed message so he had to replace that one. Per patient his next door neighbor is a retired Engineer, civil (consulting) who assisted him with the 3rd placement of his Dexcom sensor and now this one fits perfectly with no issues.   Patient stated that he is going to contact [employer] regarding the first 2 that failed him and have them send him replacements for those. Patient stated that the sensor does annoy him at night when his blood sugars are running low and he has to get up. Per patient the other night his sensor was going off and it stated that his blood sugars were 43. Patient denied having any symptoms that would indicate that his blood sugars were running low but he did get up and eat something to bring them back up and the next morning he stated that it was around 80. Patient stated that he already has to get up enough during the middle of the night without the senor beeping so he would like to discuss with CPP on Monday 06/10 during his appointment if he can adjust his Lantus to 3 units at night and 4  units in the morning.   Patient stated this morning his blood sugar was 181 before breakfast and now on the phone with me it's 197 after breakfast. Per patient before going to bed he did eat a handful of peanuts and one iced oatmeal cookie and prior to bed his reading was 161. He had the snack to prevent his blood sugar from running low during the night.   Patient stated that he will follow-up with CPP on Monday and bring in Dexcom supplies and discuss with him on how he can control low blood sugars and for him to assist with placement of Dexcom to make sure it's properly placed and will not fall out. Patient encouraged to give me a call if he needs assistance with getting the replacement for his sensors that did not work for him.  Medications: Outpatient Encounter Medications as of 08/02/2022  Medication Sig Note   acetaminophen (TYLENOL) 500 MG tablet Take 1,000 mg by mouth 2 (two) times daily.    aspirin 81 MG tablet Take 81 mg by mouth daily.  11/14/2014: Received from: [facility]   atorvastatin (LIPITOR) 40 MG tablet Take 1 tablet (40 mg total) by mouth at bedtime.    Blood Glucose Calibration (TRUE METRIX LEVEL 1) Low SOLN 1 each by In Vitro route daily.  Blood Glucose Monitoring Suppl (FREESTYLE FREEDOM) KIT by Does not apply route.    cetirizine (ZYRTEC) 10 MG tablet TAKE 1 TABLET EVERY DAY    cholecalciferol (VITAMIN D) 1000 UNITS tablet Take 1,000 Units by mouth daily.  11/14/2014: Received from: [facility]   famotidine (PEPCID) 20 MG tablet Take 20 mg by mouth daily.  11/14/2014: Received from: [facility]   fluticasone (FLONASE) 50 MCG/ACT nasal spray Place into both nostrils daily.    glipiZIDE (GLUCOTROL) 10 MG tablet Take 1 tablet (10 mg total) by mouth daily.    glucose blood (TRUE METRIX BLOOD GLUCOSE TEST) test strip USE ONE TIME DAILY AS DIRECTED    insulin glargine (LANTUS) 100 UNIT/ML Solostar Pen Inject 7 Units into the  skin daily. Up to a max of 30 units daily.    Insulin Pen Needle (BD PEN NEEDLE NANO 2ND GEN) 32G X 4 MM MISC USE WITH LANTUS INSULIN FOR 1 INJECTION DAILY. REPLACE NEEDLE FOR EACH INJECTION    Lactobacillus (PROBIOTIC ACIDOPHILUS PO) Take 1 tablet by mouth daily.    latanoprost (XALATAN) 0.005 % ophthalmic solution Place 1 drop into both eyes at bedtime.  11/14/2014: [facility]ceived [facility]   loratadine (CLARITIN) 5 MG chewable tablet Chew 5 mg by mouth daily.    losartan (COZAAR) 50 MG tablet TAKE 1 TABLET EVERY DAY    metFORMIN (GLUCOPHAGE) 500 MG tablet Take 1 tablet (500 mg total) by mouth daily with breakfast.    metoprolol succinate (TOPROL-XL) 25 MG 24 hr tablet Take 1 tablet (25 mg total) by mouth daily.    montelukast (SINGULAIR) 10 MG tablet TAKE 1 TABLET AT BEDTIME    pioglitazone (ACTOS) 45 MG tablet TAKE 1 TABLET EVERY DAY    polyethylene glycol (MIRALAX / GLYCOLAX) 17 g packet Take 17 g by mouth daily as needed for mild constipation.    psyllium (METAMUCIL SMOOTH TEXTURE) 28 % packet Take 1 packet by mouth 2 (two) times daily.  11/14/2014: Received from: [employer]   timolol (TIMOPTIC) 0.5 % ophthalmic solution 1 drop every morning.    triamcinolone cream (KENALOG) 0.1 % Apply 1 Application topically 2 (two) times daily.    TRUEplus Lancets 33G MISC USE ONE TIME DAILY AS DIRECTED    No facility-administered encounter medications on file as of 08/02/2022.   Adelene Idler, CPA/CMA Clinical Pharmacist Assistant Phone: [phone]

## 2022-08-09 ENCOUNTER — Telehealth: Payer: Self-pay | Admitting: Family Medicine

## 2022-08-09 ENCOUNTER — Ambulatory Visit: Payer: Medicare HMO

## 2022-08-09 DIAGNOSIS — Z794 Long term (current) use of insulin: Secondary | ICD-10-CM

## 2022-08-09 MED ORDER — METFORMIN HCL ER 500 MG PO TB24: 500.0000 mg | ORAL_TABLET | Freq: Every day | ORAL | 3 refills | Status: DC

## 2022-08-09 MED ORDER — INSULIN GLARGINE 100 UNIT/ML SOLOSTAR PEN: 4.0000 [IU] | PEN_INJECTOR | Freq: Two times a day (BID) | SUBCUTANEOUS | Status: DC

## 2022-08-09 NOTE — Patient Instructions (Signed)
Visit Information It was great speaking with you today!  Please let me know if you have any questions about our visit.  Plan:  Please take your Montelukast at nighttime.  We are going to switch your metformin to a long-acting form.  Let's adjust your Lantus to 4 units in the morning and 4 units at nighttime.   Print copy of patient instructions, educational materials, and care plan provided in person.  Face to Face appointment with pharmacist scheduled for:  09/14/2022 at 3:00 PM   Angelena Sole, PharmD, BCACP, CPP  Clinical Pharmacist Practitioner  [facility]

## 2022-08-09 NOTE — Telephone Encounter (Signed)
Pt states that he seen the pharmacists  Julious Payer  today and was told that he was going to send him in a new form of Metformin in today for him to start tomorrow. Per pt he has gone to the pharmacy and the medication is not there and he was seen an hour ago. Pt would like a call back.

## 2022-08-09 NOTE — Telephone Encounter (Signed)
Rx sent in

## 2022-08-09 NOTE — Progress Notes (Unsigned)
Care Management & Coordination Services Pharmacy Note  08/09/2022 Name:  Carl Dominguez MRN:  [id number] DOB:  Oct 16, 1945  Summary: Patient presents for follow-up consult.  -Patient received Dexcom G7 and was given extensive training on the system today.   Recommendations/Changes made from today's visit:   Follow up plan: -CPP Follow-up 2 weeks   Subjective: Carl Dominguez is an 77 y.o. year old male who is a primary patient of Simmons-Robinson, Tawanna Cooler, MD.  The care coordination team was consulted for assistance with disease management and care coordination needs.    Engaged with patient face to face for follow up visit.  Recent office visits: 07/06/2022 Ronnald Ramp, MD (PCP Office Visit) for Follow-up- No medication changes noted, Lab orders placed,    06/21/2022 Mila Merry, MD (PCP Office Visit) for Seasonal Allergic Rhinitis- Started: Amoxicillin 1000 mg twice daily, Stopped: Cholestyramine 4 mg, No orders placed, No follow-up noted   02/132/2024 Albertine Grates, LPN (PCP Clinical Support Office Visit) for Medicare Wellness Exam- No medication changes noted, No orders placed   04/05/2022 Ronnald Ramp, MD (PCP Office Visit) for Follow-up- Changed: Insulin Glargine 5 units daily increased to 7 units daily, Lab order placed, patient to follow-up in 3 months  Recent consult visits: 05/27/2022 Dwayne Dorthey Sawyer, MD (Cardiology) for Follow-up- No medication changes noted, No orders placed, Patient to follow-up in 6 months   04/07/2022 Darryl Lent., MD (Gastroenterology) for Initial Consult- No medication changes noted, No orders placed  Hospital visits: Admitted to the hospital on 04/29/20124 due to Colonoscopy. Discharge date was 06/28/2022. Discharged from [facility].    Objective:  Lab Results  Component Value Date   CREATININE 1.21 08/11/2021   BUN 26 08/11/2021   EGFR 61 08/11/2021    GFRNONAA 58 (L) 04/24/2020   GFRAA 67 04/24/2020   NA 138 08/11/2021   K 5.2 08/11/2021   CALCIUM 9.3 08/11/2021   CO2 21 08/11/2021   GLUCOSE 226 (H) 08/11/2021    Lab Results  Component Value Date/Time   HGBA1C 8.1 (A) 07/06/2022 08:42 AM   HGBA1C 8.2 (A) 04/05/2022 08:29 AM   HGBA1C 7.1 (H) 05/20/2021 08:51 AM   HGBA1C 7.4 (H) 09/15/2020 09:22 AM   MICROALBUR 20 11/23/2017 09:24 AM   MICROALBUR negative 11/15/2016 08:45 AM    Last diabetic Eye exam:  Lab Results  Component Value Date/Time   HMDIABEYEEXA No Retinopathy 05/17/2022 12:00 AM    Last diabetic Foot exam: No results found for: "HMDIABFOOTEX"   Lab Results  Component Value Date   CHOL 126 09/15/2020   HDL 50 09/15/2020   LDLCALC 53 09/15/2020   TRIG 134 09/15/2020   CHOLHDL 2.5 09/15/2020       Latest Ref Rng & Units 08/11/2021    2:26 PM 09/15/2020    9:22 AM 04/24/2020    8:41 AM  Hepatic Function  Total Protein 6.0 - 8.5 g/dL 6.3  6.2  6.9   Albumin 3.7 - 4.7 g/dL 4.4  3.9  4.5   AST 0 - 40 IU/L 17  15  20    ALT 0 - 44 IU/L 16  18  14    Alk Phosphatase 44 - 121 IU/L 83  75  70   Total Bilirubin 0.0 - 1.2 mg/dL 0.8  0.9  1.3     Lab Results  Component Value Date/Time   TSH 1.400 08/11/2021 02:26 PM   TSH 3.760 09/15/2020 09:22 AM       Latest  Ref Rng & Units 08/11/2021    2:26 PM 05/20/2021    8:51 AM 09/15/2020    9:22 AM  CBC  WBC 3.4 - 10.8 x10E3/uL 8.1  8.3  11.6   Hemoglobin 13.0 - 17.7 g/dL 16.1  09.6  04.5   Hematocrit 37.5 - 51.0 % 38.2  41.4  38.4   Platelets 150 - 450 x10E3/uL 170  160  166     No results found for: "VD25OH", "VITAMINB12"  Clinical ASCVD: Yes  The ASCVD Risk score (Arnett DK, et al., 2019) failed to calculate for the following reasons:   The patient has a prior MI or stroke diagnosis       07/06/2022    8:44 AM 06/21/2022   10:03 AM 04/13/2022    1:12 PM  Depression screen PHQ 2/9  Decreased Interest 0 0 0  Down, Depressed, Hopeless 0 0 0  PHQ - 2 Score 0 0  0  Altered sleeping 0 0 0  Tired, decreased energy 0 0 0  Change in appetite 0 0 0  Feeling bad or failure about yourself  0 0 0  Trouble concentrating 0 0 0  Moving slowly or fidgety/restless 0 0 0  Suicidal thoughts 0 0 0  PHQ-9 Score 0 0 0  Difficult doing work/chores Not difficult at all Not difficult at all Not difficult at all     Social History   Tobacco Use  Smoking Status Former   Types: Cigarettes   Quit date: 03/01/1982   Years since quitting: 40.4  Smokeless Tobacco Never  Tobacco Comments   has been quit for 30 years   BP Readings from Last 3 Encounters:  07/06/22 119/73  06/28/22 104/64  06/21/22 139/74   Pulse Readings from Last 3 Encounters:  07/06/22 60  06/28/22 75  06/21/22 70   Wt Readings from Last 3 Encounters:  07/06/22 160 lb 11.2 oz (72.9 kg)  06/28/22 157 lb (71.2 kg)  06/21/22 164 lb (74.4 kg)   BMI Readings from Last 3 Encounters:  07/06/22 25.17 kg/m  06/28/22 24.59 kg/m  06/21/22 25.69 kg/m    No Known Allergies  Medications Reviewed Today     Reviewed by Gaspar Cola, RPH (Pharmacist) on 07/13/22 at 0936  Med List Status: <None>   Medication Order Taking? Sig Documenting Provider Last Dose Status Informant  acetaminophen (TYLENOL) 500 MG tablet [id number] Yes Take 1,000 mg by mouth 2 (two) times daily. [provider] Taking Active   aspirin 81 MG tablet [id number] Yes Take 81 mg by mouth daily.  [provider] Taking Active            Med Note Cyndia Bent   Thu Nov 14, 2014  8:06 AM) Received from: [employer]  atorvastatin (LIPITOR) 40 MG tablet [id number] Yes Take 1 tablet (40 mg total) by mouth at bedtime. Simmons-Robinson, Makiera, MD Taking Active   Blood Glucose Calibration (TRUE METRIX LEVEL 1) Low SOLN [id number]  1 each by In Vitro route daily. Bosie Clos, MD  Active   Blood Glucose Monitoring Suppl (FREESTYLE FREEDOM) KIT [id number]  by Does not apply route.  [provider]  Active   cetirizine (ZYRTEC) 10 MG tablet [id number] Yes TAKE 1 TABLET EVERY DAY Bosie Clos, MD Taking Active   cholecalciferol (VITAMIN D) 1000 UNITS tablet [id number] Yes Take 1,000 Units by mouth daily.  [provider] Taking Active  Med Note Cyndia Bent   Thu Nov 14, 2014  8:06 AM) Received from: [employer]  famotidine (PEPCID) 20 MG tablet [id number]  Take 20 mg by mouth daily.  [provider]  Active            Med Note Cyndia Bent   Thu Nov 14, 2014  8:06 AM) Received from: [facility]  fluticasone [facility]) 50 MCG/ACT nasal spray [id number] Yes Place into both nostrils daily. [provider] Taking Active   glipiZIDE (GLUCOTROL) 10 MG tablet [id number] Yes Take 1 tablet (10 mg total) by mouth daily. Simmons-Robinson, Makiera, MD Taking Active   insulin glargine (LANTUS) 100 UNIT/ML Solostar Pen [id number] Yes Inject 7 Units into the skin daily. Up to a max of 30 units daily. Simmons-Robinson, Makiera, MD Taking Active   Insulin Pen Needle (BD PEN NEEDLE NANO 2ND GEN) 32G X 4 MM MISC [id number]  USE WITH LANTUS INSULIN FOR 1 INJECTION DAILY. REPLACE NEEDLE FOR EACH INJECTION Alfredia Ferguson, PA-C  Active   Lactobacillus (PROBIOTIC ACIDOPHILUS PO) [id number] Yes Take 1 tablet by mouth daily. [provider] Taking Active   latanoprost (XALATAN) 0.005 % ophthalmic solution [id number]  Place 1 drop into both eyes at bedtime.  [provider]  Active            Med Note Cyndia Bent   Thu Nov 14, 2014  8:06 AM) Received from: [employer]  loratadine (CLARITIN) 5 MG chewable tablet [id number]  Chew 5 mg by mouth daily. [provider]  Active   losartan (COZAAR) 50 MG tablet [id number] Yes TAKE 1 TABLET EVERY DAY Simmons-Robinson, Makiera, MD Taking Active   metFORMIN (GLUCOPHAGE) 500 MG tablet [id number] Yes Take 1 tablet (500 mg total) by  mouth daily with breakfast. Simmons-Robinson, Makiera, MD Taking Active   metoprolol succinate (TOPROL-XL) 25 MG 24 hr tablet [id number] Yes Take 1 tablet (25 mg total) by mouth daily. Simmons-Robinson, Makiera, MD Taking Active   montelukast (SINGULAIR) 10 MG tablet [id number] Yes TAKE 1 TABLET AT BEDTIME Bosie Clos, MD Taking Active   pioglitazone (ACTOS) 45 MG tablet [id number] Yes TAKE 1 TABLET EVERY DAY Simmons-Robinson, Makiera, MD Taking Active   polyethylene glycol (MIRALAX / GLYCOLAX) 17 g packet [id number] Yes Take 17 g by mouth daily as needed for mild constipation. [provider] Taking Active   psyllium (METAMUCIL SMOOTH TEXTURE) 28 % packet [id number] Yes Take 1 packet by mouth 2 (two) times daily.  [provider] Taking Active            Med Note Cyndia Bent   Thu Nov 14, 2014  8:06 AM) Received from: [employer]  timolol (TIMOPTIC) 0.5 % ophthalmic solution [id number]  1 drop every morning. [provider]  Active   triamcinolone cream (KENALOG) 0.1 % [id number]  Apply 1 Application topically 2 (two) times daily. [provider]  Active   TRUE METRIX BLOOD GLUCOSE TEST test strip [id number]  USE ONE TIME DAILY AS DIRECTED Bosie Clos, MD  Active   TRUEplus Lancets 33G MISC [id number]  USE ONE TIME DAILY AS DIRECTED Bosie Clos, MD  Active             SDOH:  (Social Determinants of Health) assessments and interventions performed: Yes SDOH Interventions    Flowsheet Row Care Coordination from 07/13/2022 in [facility] Clinical Support from 04/13/2022 in [facility] Office Visit from 08/26/2021  in [facility] Office Visit from 05/20/2021 in [facility] Clinical Support from 04/09/2021 in [facility] Clinical Support from 04/03/2020 in [facility]  SDOH Interventions         Food Insecurity Interventions -- Intervention Not Indicated -- -- Intervention Not Indicated --  Housing Interventions -- -- -- -- Intervention Not Indicated --  Transportation Interventions Intervention Not Indicated Intervention Not Indicated -- -- Intervention Not Indicated --  Alcohol Usage Interventions -- Intervention Not Indicated (Score <7) -- -- -- --  Depression Interventions/Treatment  -- -- PHQ2-9 Score <4 Follow-up Not Indicated PHQ2-9 Score <4 Follow-up Not Indicated -- --  Financial Strain Interventions Intervention Not Indicated Intervention Not Indicated -- -- Intervention Not Indicated --  Physical Activity Interventions -- Intervention Not Indicated -- -- Intervention Not Indicated Patient Refused  Stress Interventions -- Intervention Not Indicated -- -- Intervention Not Indicated --  Social Connections Interventions -- Intervention Not Indicated -- -- Intervention Not Indicated --       Medication Assistance: None required.  Patient affirms current coverage meets needs.  Medication Access: Within the past 30 days, how often has patient missed a dose of medication? None Is a pillbox or other method used to improve adherence? No  Factors that may affect medication adherence? lack of understanding of disease management and hearing impairment Are meds synced by current pharmacy? No  Are meds delivered by current pharmacy? Yes  Does patient experience delays in picking up medications due to transportation concerns? No   [facility] Reviewed: Is patient disadvantaged to use [facility]?: Yes  Current Rx insurance plan: [employer] Name and location of Current pharmacy:  [facility] - [place], [place] - [address] [place] [address] Phone: [phone] Fax: [phone]  [facility] #[id number] [place], [place] - [address] AT [facility] [address] [place] [address] Phone: [phone] Fax:  [phone]  [facility] services reviewed with patient today?: Yes  Patient requests to transfer care to [facility]?: No  Reason patient declined to change pharmacies: Disadvantaged due to insurance/mail order  Compliance/Adherence/Medication fill history: Care Gaps: COVID-19 Vaccine   [facility] Drugs: Atorvastatin 40 mg last filled on 05/31/2022 for a 90-DS with [facility] Glipizide 10 mg last filled on 05/31/2022 for a 90-DS with [facility] Losartan 50 mg last filled on 05/24/2022 for a 90-DS with [facility] Metformin 500 mg last filled on 06/11/2022 for a 90-DS with [facility] Pioglitazone 45 mg last filled on 05/24/2022 for a 90-DS with [facility]  Assessment/Plan   Hypertension (BP goal <130/80) -Controlled -Current treatment: Losartan 50 mg daily Metoprolol XL 25 mg daily  -Medications previously tried: NA  -Denies hypotensive/hypertensive symptoms -Recommended to continue current medication  Hyperlipidemia: (LDL goal < 70) -Controlled -Current treatment: Atorvastatin 40 mg  Aspirin 81 mg  -Medications previously tried: NA  -Recommended to continue current medication  Diabetes (A1c goal <8% within 3 months) -Uncontrolled -Diagnosed 1980-1990s, insulin dependent for 5+ years  -Current medications: Glipizide 10 mg daily Lantus 3 units twice daily   Metformin 500 mg daily Pioglitazone 45 mg  -Medications previously tried: NA  -Denies hypoglycemic/hyperglycemic symptoms  Fasting: 153, 154, 170, 185  -Current meal patterns: breakfast: 2 scrambled eggs, 2 slices of bacon, grits  -Current exercise: Active most days with yard work, gardening. Tends to be more inactive during the winter.  -Patient reports  frustrations with his blood sugars and inability to keep his sugars under control throughout the day. He notes his blood sugars tend to be highest overnight and in the AM  -Given patient's challenges with constipation and  poor appetite, he may not be a great candidate for GLP-1 agonist. -Patient received Dexcom G7 and was given extensive training on the system today.  -Goal: Patient to monitor blood sugar once daily before breakfast and record.  -HC assessment in 2 weeks to review blood sugars and ensure Dexcom order is processing. Patient will also co-ordinate with [facility] [employer] regarding first delivery of Dexcom G7.  -CPP follow-up 1 month for Dexcom training   Follow Up Plan: Face to Face appointment with care management team member scheduled for:  08/09/2022 at 10:00 AM   Angelena Sole, PharmD, Patsy Baltimore, CPP  Clinical Pharmacist Practitioner  [facility] [phone]

## 2022-08-10 DIAGNOSIS — M546 Pain in thoracic spine: Secondary | ICD-10-CM | POA: Diagnosis not present

## 2022-08-10 DIAGNOSIS — M531 Cervicobrachial syndrome: Secondary | ICD-10-CM | POA: Diagnosis not present

## 2022-08-10 DIAGNOSIS — M5137 Other intervertebral disc degeneration, lumbosacral region: Secondary | ICD-10-CM | POA: Diagnosis not present

## 2022-08-10 DIAGNOSIS — M9903 Segmental and somatic dysfunction of lumbar region: Secondary | ICD-10-CM | POA: Diagnosis not present

## 2022-08-10 DIAGNOSIS — M9901 Segmental and somatic dysfunction of cervical region: Secondary | ICD-10-CM | POA: Diagnosis not present

## 2022-08-10 DIAGNOSIS — M9902 Segmental and somatic dysfunction of thoracic region: Secondary | ICD-10-CM | POA: Diagnosis not present

## 2022-08-12 ENCOUNTER — Other Ambulatory Visit: Payer: Self-pay | Admitting: Family Medicine

## 2022-08-12 DIAGNOSIS — E119 Type 2 diabetes mellitus without complications: Secondary | ICD-10-CM

## 2022-08-12 DIAGNOSIS — I10 Essential (primary) hypertension: Secondary | ICD-10-CM

## 2022-08-16 DIAGNOSIS — Z794 Long term (current) use of insulin: Secondary | ICD-10-CM | POA: Diagnosis not present

## 2022-08-16 DIAGNOSIS — E1165 Type 2 diabetes mellitus with hyperglycemia: Secondary | ICD-10-CM | POA: Diagnosis not present

## 2022-09-09 ENCOUNTER — Other Ambulatory Visit: Payer: Self-pay | Admitting: Family Medicine

## 2022-09-09 DIAGNOSIS — E119 Type 2 diabetes mellitus without complications: Secondary | ICD-10-CM

## 2022-09-09 MED ORDER — BD PEN NEEDLE NANO 2ND GEN 32G X 4 MM MISC
2 refills | Status: DC
Start: 2022-09-09 — End: 2022-09-10

## 2022-09-09 NOTE — Telephone Encounter (Signed)
Requested Prescriptions  Pending Prescriptions Disp Refills   Insulin Pen Needle (BD PEN NEEDLE NANO 2ND GEN) 32G X 4 MM MISC 100 each 2    Sig: USE WITH LANTUS INSULIN FOR 1 INJECTION DAILY. REPLACE NEEDLE FOR [facility] INJECTION     Endocrinology: Diabetes - Testing Supplies Passed - 09/09/2022 10:27 AM      Passed - Valid encounter within last 12 months    Recent Outpatient Visits           2 months ago Type 2 diabetes mellitus with hyperglycemia, with long-term current use of insulin (HCC)   [facility] [facility] Simmons-Robinson, [place], MD   2 months ago Seasonal allergic rhinitis, unspecified trigger   [facility] Malva Limes, MD   5 months ago Type 2 diabetes mellitus without complication, without long-term current use of insulin (HCC)   [facility] Simmons-Robinson, [place], MD   8 months ago Diabetes mellitus due to underlying condition with stage 3 chronic kidney disease, with long-term current use of insulin, unspecified whether stage 3a or 3b CKD (HCC)   [facility] Simmons-Robinson, [place], MD   11 months ago Diabetes mellitus due to underlying condition with stage 3 chronic kidney disease, with long-term current use of insulin, unspecified whether stage 3a or 3b CKD (HCC)   [facility] Bosie Clos, MD       Future Appointments             In 4 weeks Simmons-Robinson, Tawanna Cooler, MD [facility]

## 2022-09-09 NOTE — Telephone Encounter (Signed)
Medication Refill - Medication: Insulin Pen Needle (BD PEN NEEDLE NANO 2ND GEN) 32G X 4 MM MISC   Has the patient contacted their pharmacy? No. No, more refills.  (Agent: If no, request that the patient contact the pharmacy for the refill. If patient does not wish to contact the pharmacy document the reason why and proceed with request.)   Preferred Pharmacy (with phone number or street name):  [facility] #[id number] [place], [place] - [address] AT [facility] OF [address] [place]  [address] [place] [address]  Phone: [phone] Fax: [phone]  Hours: Not open 24 hours   Has the patient been seen for an appointment in the last year OR does the patient have an upcoming appointment? Yes.    Agent: Please be advised that RX refills may take up to 3 business days. We ask that you follow-up with your pharmacy.

## 2022-09-10 ENCOUNTER — Telehealth: Payer: Self-pay | Admitting: Family Medicine

## 2022-09-10 ENCOUNTER — Other Ambulatory Visit: Payer: Self-pay

## 2022-09-10 DIAGNOSIS — E119 Type 2 diabetes mellitus without complications: Secondary | ICD-10-CM

## 2022-09-10 MED ORDER — TRUE METRIX BLOOD GLUCOSE TEST VI STRP: ORAL_STRIP | 6 refills | Status: DC

## 2022-09-10 MED ORDER — BD PEN NEEDLE NANO 2ND GEN 32G X 4 MM MISC
2 refills | Status: DC
Start: 2022-09-10 — End: 2022-10-07

## 2022-09-10 NOTE — Telephone Encounter (Signed)
Is this regarding the needles? I updated the prescription earlier today

## 2022-09-10 NOTE — Telephone Encounter (Signed)
Pt is calling in because he went to the pharmacy to pick up his Insulin Pen Needle (BD PEN NEEDLE NANO 2ND GEN) 32G X 4 MM MISC [[id number]] from the pharmacy and he was told it was too early to pick it up.

## 2022-09-10 NOTE — Telephone Encounter (Signed)
Patient called and advised the refill was sent on 04/09/22 #100/1 refill, so that means it will be due in August. He says he understands that, but the doctor told him to take Lantus twice a day and that's why he is running out. He says he has about 7 days left on the needles. I advised I will send to Dr. Roxan Hockey to send in a new Rx with BID instructions and that it will be sent in as soon as she can get to it. He verbalized understanding.   Phramacy:  [facility] #[id number] - [place], [place] - [address] AT [facility] OF [address] [facility] Phone: [phone]  Fax: [phone]

## 2022-09-10 NOTE — Telephone Encounter (Signed)
Updated prescription for needles sent to [facility] [phone]

## 2022-09-10 NOTE — Telephone Encounter (Signed)
Pt says he needs an updated prescription sent in after this refill because he checks his sugar twice a day instead of once a day and his current prescription reflects once a day. Pt is requesting a nurse call him back.

## 2022-09-14 ENCOUNTER — Telehealth: Payer: Self-pay | Admitting: Family Medicine

## 2022-09-14 DIAGNOSIS — M9901 Segmental and somatic dysfunction of cervical region: Secondary | ICD-10-CM | POA: Diagnosis not present

## 2022-09-14 DIAGNOSIS — M5137 Other intervertebral disc degeneration, lumbosacral region: Secondary | ICD-10-CM | POA: Diagnosis not present

## 2022-09-14 DIAGNOSIS — M531 Cervicobrachial syndrome: Secondary | ICD-10-CM | POA: Diagnosis not present

## 2022-09-14 DIAGNOSIS — M546 Pain in thoracic spine: Secondary | ICD-10-CM | POA: Diagnosis not present

## 2022-09-14 DIAGNOSIS — M9902 Segmental and somatic dysfunction of thoracic region: Secondary | ICD-10-CM | POA: Diagnosis not present

## 2022-09-14 DIAGNOSIS — M9903 Segmental and somatic dysfunction of lumbar region: Secondary | ICD-10-CM | POA: Diagnosis not present

## 2022-09-14 NOTE — Telephone Encounter (Signed)
Returning your phone call. Could you follow up with patient when you get a chance, please.

## 2022-09-15 ENCOUNTER — Telehealth: Payer: Self-pay

## 2022-09-15 DIAGNOSIS — E1165 Type 2 diabetes mellitus with hyperglycemia: Secondary | ICD-10-CM | POA: Diagnosis not present

## 2022-09-15 DIAGNOSIS — Z794 Long term (current) use of insulin: Secondary | ICD-10-CM | POA: Diagnosis not present

## 2022-09-15 NOTE — Telephone Encounter (Signed)
This patient had an appointment with Carl Dominguez yesterday.  I was unaware that no one had called him. So, unfortunately he wasn't able to get the help he wanted.  Can you touch base with him and see if there is anything that we can help him with? Thank you He did mention that he will be out of town next Tues through Sat. Again thank you for your help. Michelle Nasuti

## 2022-10-01 ENCOUNTER — Encounter: Payer: Self-pay | Admitting: Pharmacist

## 2022-10-01 ENCOUNTER — Other Ambulatory Visit: Payer: Medicare HMO | Admitting: Pharmacist

## 2022-10-01 NOTE — Progress Notes (Signed)
10/01/2022 Name: Carl Dominguez MRN: [id number] DOB: [birth year]  Chief Complaint  Patient presents with   Medication Management    Carl Dominguez is a 77 y.o. year old male who presented for a telephone visit.   They were referred to the pharmacist by their PCP for assistance in managing diabetes.    Subjective:  Care Team: Primary Care Provider: Ronnald Ramp, MD ; Next Scheduled Visit: 10/07/2022 Cardiologist: Mirian Capuchin, MD; Next Scheduled Visit: 11/25/2022 Gastroenterologist: Darryl Lent, MD; Next Scheduled Visit: 11/03/2022  Medication Access/Adherence  Current Pharmacy:  [facility] - [place], [place] - [address] [place] [address] Phone: [phone] Fax: [phone]  [facility] [phone] [place], [place] - [address] AT [facility] [address] [place] [address] Phone: [phone] Fax: [phone]  [facility] #[id number] [place], [place] - [address] AT [facility] [address] [place] [address] Phone: [phone] Fax: [phone]   Patient reports affordability concerns with their medications: No  Patient reports access/transportation concerns to their pharmacy: No  Patient reports adherence concerns with their medications:  No     Diabetes:  Current medications:  - glipizide 10 mg daily before breakfast - metformin ER 500 mg daily with breakfast - pioglitazone 45 mg daily - Lantus Solostar - 4 units in morning and 4 units in evening at bedtime  Reports uses Dexcom G7 continuous glucose monitor (CGM) with reader Patient reports data from reader from past 14- day report today: Average Glucose: 162 mg/dL Glucose Management Indicator: 7.2  Time in Goal:  - Time in range 70-180: 71% - Time above range: 28% - Time below range: 1%  Patient reports episode of low blood sugar with sweating at reading ~70.   Current meal  patterns:  - Breakfast: 2 scrambled eggs, instant grits, 2 slices of bacon and sometimes 1 piece of toast with jelly - Lunch: ham and cheese sandwich - Supper: [employer] - Dessert: seldom has, but occasionally ice cream or sherbet - Snacks: Squares of cheese with raisins or cranberries and cashews or peanut butter crackers - Drinks: water and sugar-free lemonade and 3:1 split unsweet to sweet tea with supper and glass of OJ when takes medicine in AM & PM  Current physical activity: reports stays active in the yard throughout the day (mowing, gardening, trimming). Reports also has membership to [employer] through [employer], but has not been in over a year  Statin therapy: atorvastatin 40 mg daily   Hypertension:  Current medications:  - losartan 50 mg daily - metoprolol ER 25 daily  Patient has an automated, upper arm home BP cuff Denies checking BP at home  Patient denies hypotensive s/sx including dizziness, lightheadedness as long as takes positional changes slowly    Objective:  Lab Results  Component Value Date   HGBA1C 8.1 (A) 07/06/2022    Lab Results  Component Value Date   CREATININE 1.21 08/11/2021   BUN 26 08/11/2021   NA 138 08/11/2021   K 5.2 08/11/2021   CL 101 08/11/2021   CO2 21 08/11/2021    Lab Results  Component Value Date   CHOL 126 09/15/2020   HDL 50 09/15/2020   LDLCALC 53 09/15/2020   TRIG 134 09/15/2020   CHOLHDL 2.5 09/15/2020   BP Readings from Last 3 Encounters:  07/06/22 119/73  06/28/22 104/64  06/21/22  139/74   Pulse Readings from Last 3 Encounters:  07/06/22 60  06/28/22 75  06/21/22 70     Medications Reviewed Today     Reviewed by Manuela Neptune, RPH-CPP (Pharmacist) on 10/01/22 at 1154  Med List Status: <None>   Medication Order Taking? Sig Documenting Provider Last Dose Status Informant  acetaminophen (TYLENOL) 500 MG tablet [id number] Yes Take 1,000 mg by mouth 2 (two) times daily.  [provider] Taking Active   aspirin 81 MG tablet [id number] Yes Take 81 mg by mouth daily.  [provider] Taking Active            Med Note Cyndia Bent   Thu Nov 14, 2014  8:06 AM) Received from: [employer]  atorvastatin (LIPITOR) 40 MG tablet [id number] Yes TAKE 1 TABLET AT BEDTIME Simmons-Robinson, Makiera, MD Taking Active   Blood Glucose Calibration (TRUE METRIX LEVEL 1) Low SOLN [id number]  1 each by In Vitro route daily. Bosie Clos, MD  Active   Blood Glucose Monitoring Suppl (FREESTYLE FREEDOM) KIT [id number]  by Does not apply route. [provider]  Active   cetirizine (ZYRTEC) 10 MG tablet [id number] Yes TAKE 1 TABLET EVERY DAY Bosie Clos, MD Taking Active   cholecalciferol (VITAMIN D) 1000 UNITS tablet [id number] Yes Take 1,000 Units by mouth daily.  [provider] Taking Active            Med Note Cyndia Bent   Thu Nov 14, 2014  8:06 AM) Received from: [employer]  famotidine (PEPCID) 20 MG tablet [id number] Yes Take 20 mg by mouth daily.  [provider] Taking Active            Med Note Cyndia Bent   Thu Nov 14, 2014  8:06 AM) Received from: [facility]  fluticasone [facility]) 50 MCG/ACT nasal spray [id number] Yes Place into both nostrils daily as needed. [provider] Taking Active   glipiZIDE (GLUCOTROL) 10 MG tablet [id number] Yes TAKE 1 TABLET EVERY DAY Simmons-Robinson, Makiera, MD Taking Active   glucose blood (TRUE METRIX BLOOD GLUCOSE TEST) test strip [id number]  Test glucose twice daily and as needed Simmons-Robinson, Makiera, MD  Active   insulin glargine (LANTUS) 100 UNIT/ML Solostar Pen [id number] Yes Inject 4 Units into the skin 2 (two) times daily. Simmons-Robinson, Makiera, MD Taking Active   Insulin Pen Needle (BD PEN NEEDLE NANO 2ND GEN) 32G X 4 MM MISC [id number]  USE WITH LANTUS INSULIN FOR 2 INJECTIONS DAILY. REPLACE NEEDLE FOR  EACH INJECTION Simmons-Robinson, Makiera, MD  Active   Lactobacillus (PROBIOTIC ACIDOPHILUS PO) [id number] Yes Take 1 tablet by mouth daily. [provider] Taking Active   latanoprost (XALATAN) 0.005 % ophthalmic solution [id number] Yes Place 1 drop into both eyes at bedtime.  [provider] Taking Active            Med Note Cyndia Bent   Thu Nov 14, 2014  8:06 AM) Received from: [employer]  losartan (COZAAR) 50 MG tablet [id number] Yes TAKE 1 TABLET EVERY DAY Simmons-Robinson, Makiera, MD Taking Active   metFORMIN (GLUCOPHAGE-XR) 500 MG 24 hr tablet [id number] Yes Take 1 tablet (500 mg total) by mouth daily with breakfast. Simmons-Robinson, Makiera, MD Taking Active   metoprolol succinate (TOPROL-XL) 25 MG 24 hr tablet [id number] Yes TAKE 1 TABLET EVERY DAY Simmons-Robinson, Makiera, MD Taking Active   montelukast (SINGULAIR) 10 MG tablet [id number] Yes TAKE 1 TABLET AT BEDTIME Sullivan Lone,  Ferdinand Lango, MD Taking Active   pioglitazone (ACTOS) 45 MG tablet [id number] Yes TAKE 1 TABLET EVERY DAY Simmons-Robinson, Makiera, MD Taking Active   polyethylene glycol (MIRALAX / GLYCOLAX) 17 g packet [id number] Yes Take 17 g by mouth daily as needed for mild constipation. [provider] Taking Active   psyllium (METAMUCIL SMOOTH TEXTURE) 28 % packet [id number] Yes Take 1 packet by mouth 2 (two) times daily.  [provider] Taking Active            Med Note Cyndia Bent   Thu Nov 14, 2014  8:06 AM) Received from: [employer]  timolol (TIMOPTIC) 0.5 % ophthalmic solution [id number] Yes 1 drop every morning. [provider] Taking Active   triamcinolone cream (KENALOG) 0.1 % [id number] Yes Apply 1 Application topically 2 (two) times daily as needed. [provider] Taking Active   TRUEplus Lancets 33G MISC [id number]  USE ONE TIME DAILY AS DIRECTED Simmons-Robinson, Makiera, MD  Active              Assessment/Plan:    Diabetes: - Currently uncontrolled - Reviewed long term cardiovascular and renal outcomes of uncontrolled blood sugar - Reviewed goal A1c, goal fasting, and goal 2 hour post prandial glucose - Reviewed dietary modifications including: Encourage patient to have regular well-balance meals and snacks while controlling carbohydrate portion sizes Encourage patient to incorporate more non-starchy vegetables in his meals - Reviewed lifestyle modifications including: - Counsel patient on s/s of low blood sugar and how to treat lows Review rules of 15s - importance of using 15 grams of sugar to treat low, recheck blood sugar in 15 minutes, treat again if remains low or, if back to normal, having meal if mealtime or snack  Review examples of sources of 15 grams of sugar Encourage patient to pick up glucose tablets to carry with him - Recommend to continue to use Dexcom G7 continuous glucose monitor (CGM) to keep track of his blood sugar and use as feedback on dietary choices. Patient to continue to use fingerstick checks as needed as back up to CGM. - Patient expresses interest in having his son setup Dexcom G7/Clarity App on his smartphone to use in place of reader device  Send patient MyChart message with setup videos/information today as requested  Hypertension: - Currently controlled - Advise patient to take positional changes slowly - Encourage patient to keep a record when checks home blood pressure and heart rate and bring this record with him to medical appointments   Follow Up Plan: Clinical Pharmacist will follow up with patient by telephone on 11/10/2022 at 8:30 am  Estelle Grumbles, PharmD, [facility] [phone]

## 2022-10-01 NOTE — Patient Instructions (Signed)
Goals Addressed             This Visit's Progress    Pharmacy Goals       It was a pleasure speaking with you today!   If you would like to use your smartphone in place of your Dexcom Reader, you can use the "How to get started with the Dexcom G7 app" video from the following website:   https://www.dexcom.com/training-videos   In order to share data you will need to use the Dexcom Clarity App:   [email or web address].com/clarity   The clinic code to connect data is: [email or web address]   I look forward to speaking with you again on 11/10/2022 at 8:30 am   Estelle Grumbles, PharmD, [facility] [phone]

## 2022-10-07 ENCOUNTER — Ambulatory Visit (INDEPENDENT_AMBULATORY_CARE_PROVIDER_SITE_OTHER): Payer: Medicare HMO | Admitting: Family Medicine

## 2022-10-07 ENCOUNTER — Encounter: Payer: Self-pay | Admitting: Family Medicine

## 2022-10-07 VITALS — BP 90/60 | HR 59 | Temp 97.8°F | Resp 12 | Ht 67.0 in | Wt 159.8 lb

## 2022-10-07 DIAGNOSIS — I152 Hypertension secondary to endocrine disorders: Secondary | ICD-10-CM | POA: Diagnosis not present

## 2022-10-07 DIAGNOSIS — E1159 Type 2 diabetes mellitus with other circulatory complications: Secondary | ICD-10-CM

## 2022-10-07 DIAGNOSIS — E7800 Pure hypercholesterolemia, unspecified: Secondary | ICD-10-CM | POA: Diagnosis not present

## 2022-10-07 DIAGNOSIS — E1165 Type 2 diabetes mellitus with hyperglycemia: Secondary | ICD-10-CM

## 2022-10-07 DIAGNOSIS — Z794 Long term (current) use of insulin: Secondary | ICD-10-CM

## 2022-10-07 LAB — POCT GLYCOSYLATED HEMOGLOBIN (HGB A1C): Hemoglobin A1C: 7.3 % — AB (ref 4.0–5.6)

## 2022-10-07 NOTE — Progress Notes (Signed)
Established patient visit   Patient: Carl Dominguez   DOB: 1945-05-07   77 y.o. Male  MRN: [id number] Visit Date: 10/07/2022  Today's healthcare provider: Ronnald Ramp, MD   Chief Complaint  Patient presents with   Medical Management of Chronic Issues    Patient reports good compliance and tolerance of medications. He reports FBS are in the 150s.    Subjective     HPI     Medical Management of Chronic Issues    Additional comments: Patient reports good compliance and tolerance of medications. He reports FBS are in the 150s.       Last edited by Myles Lipps, CMA on 10/07/2022  8:46 AM.       Discussed the use of AI scribe software for clinical note transcription with the patient, who gave verbal consent to proceed.  History of Present Illness    Diabetes  Lab Results  Component Value Date   HGBA1C 7.3 (A) 10/07/2022    HM  Recommend COVID and influenza vaccine  Mr. Rueter, a patient with a history of diabetes, hypertension, and hyperlipidemia, presents for a follow-up visit. The patient's recent HbA1c has improved from 8.1 to 7.3, which is closer to the patient's personal goal. The patient has been using a Dexcom glucometer to monitor blood glucose levels. However, the patient reports occasional discrepancies between the glucometer readings and finger prick tests.  The patient has experienced episodes of hypoglycemia, characterized by sweating and discomfort when blood glucose levels drop below 70. These episodes are infrequent. The patient has also noticed a sensitivity to peanuts, which cause gastrointestinal distress and exacerbate existing internal hemorrhoids, leading to bleeding.  The patient's current diabetes management includes Glipizide 10mg  daily, Lantus 4 units twice a day, and Actos 45mg  daily. For hyperlipidemia, the patient is on Lipitor 40mg  daily. For hypertension, the patient is on Losartan 50mg  daily and Metoprolol 25mg  once  a day. The patient reports occasional dizziness upon standing quickly.  The patient's blood pressure at this visit was noted to be low at 90/60, and the patient does not regularly monitor blood pressure at home.         Medications: Outpatient Medications Prior to Visit  Medication Sig Note   acetaminophen (TYLENOL) 500 MG tablet Take 1,000 mg by mouth 2 (two) times daily.    aspirin 81 MG tablet Take 81 mg by mouth daily.     atorvastatin (LIPITOR) 40 MG tablet TAKE 1 TABLET AT BEDTIME    Blood Glucose Calibration (TRUE METRIX LEVEL 1) Low SOLN 1 each by In Vitro route daily.    Blood Glucose Monitoring Suppl (FREESTYLE FREEDOM) KIT by Does not apply route.    cetirizine (ZYRTEC) 10 MG tablet TAKE 1 TABLET EVERY DAY    cholecalciferol (VITAMIN D) 1000 UNITS tablet Take 1,000 Units by mouth daily.     famotidine (PEPCID) 20 MG tablet Take 20 mg by mouth daily.     fluticasone (FLONASE) 50 MCG/ACT nasal spray Place into both nostrils daily as needed.    glipiZIDE (GLUCOTROL) 10 MG tablet TAKE 1 TABLET EVERY DAY    insulin glargine (LANTUS) 100 UNIT/ML Solostar Pen Inject 4 Units into the skin 2 (two) times daily.    Lactobacillus (PROBIOTIC ACIDOPHILUS PO) Take 1 tablet by mouth daily.    latanoprost (XALATAN) 0.005 % ophthalmic solution Place 1 drop into both eyes at bedtime.     losartan (COZAAR) 50 MG tablet TAKE 1 TABLET EVERY  DAY    metFORMIN (GLUCOPHAGE-XR) 500 MG 24 hr tablet Take 1 tablet (500 mg total) by mouth daily with breakfast.    metoprolol succinate (TOPROL-XL) 25 MG 24 hr tablet TAKE 1 TABLET EVERY DAY    montelukast (SINGULAIR) 10 MG tablet TAKE 1 TABLET AT BEDTIME    pioglitazone (ACTOS) 45 MG tablet TAKE 1 TABLET EVERY DAY    polyethylene glycol (MIRALAX / GLYCOLAX) 17 g packet Take 17 g by mouth daily as needed for mild constipation.    psyllium (METAMUCIL SMOOTH TEXTURE) 28 % packet Take 1 packet by mouth 2 (two) times daily.     timolol (TIMOPTIC) 0.5 % ophthalmic  solution 1 drop every morning.    triamcinolone cream (KENALOG) 0.1 % Apply 1 Application topically 2 (two) times daily as needed.    [DISCONTINUED] glucose blood (TRUE METRIX BLOOD GLUCOSE TEST) test strip Test glucose twice daily and as needed    [DISCONTINUED] Insulin Pen Needle (BD PEN NEEDLE NANO 2ND GEN) 32G X 4 MM MISC USE WITH LANTUS INSULIN FOR 2 INJECTIONS DAILY. REPLACE NEEDLE FOR EACH INJECTION 10/07/2022: no longer needed, patient has CGM   [DISCONTINUED] TRUEplus Lancets 33G MISC USE ONE TIME DAILY AS DIRECTED 10/07/2022: has CGM   No facility-administered medications prior to visit.    Review of Systems      Objective    BP 90/60 (BP Location: Right Arm, Patient Position: Sitting, Cuff Size: Normal)   Pulse (!) 59   Temp 97.8 F (36.6 C) (Temporal)   Resp 12   Ht 5\' 7"  (1.702 m)   Wt 159 lb 12.8 oz (72.5 kg)   SpO2 98%   BMI 25.03 kg/m     Physical Exam Vitals reviewed.  Constitutional:      General: He is not in acute distress.    Appearance: Normal appearance. He is not ill-appearing, toxic-appearing or diaphoretic.  Eyes:     Conjunctiva/sclera: Conjunctivae normal.  Cardiovascular:     Rate and Rhythm: Normal rate and regular rhythm.     Pulses: Normal pulses.     Heart sounds: Normal heart sounds. No murmur heard.    No friction rub. No gallop.  Pulmonary:     Effort: Pulmonary effort is normal. No respiratory distress.     Breath sounds: Normal breath sounds. No stridor. No wheezing, rhonchi or rales.  Abdominal:     General: Bowel sounds are normal. There is no distension.     Palpations: Abdomen is soft.     Tenderness: There is no abdominal tenderness.  Musculoskeletal:     Right lower leg: No edema.     Left lower leg: No edema.  Skin:    Findings: No erythema or rash.  Neurological:     Mental Status: He is alert and oriented to person, place, and time.       Results for orders placed or performed in visit on 10/07/22  POCT glycosylated  hemoglobin (Hb A1C)  Result Value Ref Range   Hemoglobin A1C 7.3 (A) 4.0 - 5.6 %    Assessment & Plan     Problem List Items Addressed This Visit     Diabetes mellitus (HCC) - Primary    Improved glycemic control with A1c down to 7.3 from 8.1. Patient is using a Dexcom sensor for glucose monitoring and reports occasional hypoglycemic symptoms. No significant hypoglycemic events reported. -chronic,well controlled, recommended goal of less than 8 for A1c given advanced age  -Continue current regimen of Glipizide 10mg   daily, Lantus 4 units BID, and Actos 45mg  daily. -Order labs to check kidney function and lipid panel.      Relevant Orders   POCT glycosylated hemoglobin (Hb A1C) (Completed)   CMP14+EGFR   Hypercholesterolemia without hypertriglyceridemia    Patient is currently on Lipitor 40mg  daily. -Chronic,stable  -continue lipitor 40mg  daily  -Check lipid panel today.      Relevant Orders   Lipid panel   Hypertension associated with diabetes (HCC)    Blood pressure readings were low today (90/60 and 95/59). Patient reports occasional dizziness upon standing quickly. -Chronic, hypotensive today, asymptomatic -Monitor blood pressure and symptoms. Encourage adequate hydration. -Continue current regimen of Losartan 50mg  daily and Metoprolol 25mg  daily.      Relevant Orders   CMP14+EGFR     in 6 months or sooner if needed.         Return in about 6 months (around 04/09/2023) for DM, HTN.         Ronnald Ramp, MD  [facility] [phone] (phone) [phone] (fax)  [facility]

## 2022-10-07 NOTE — Assessment & Plan Note (Signed)
Patient is currently on Lipitor 40mg  daily. -Chronic,stable  -continue lipitor 40mg  daily  -Check lipid panel today.

## 2022-10-07 NOTE — Assessment & Plan Note (Signed)
Blood pressure readings were low today (90/60 and 95/59). Patient reports occasional dizziness upon standing quickly. -Chronic, hypotensive today, asymptomatic -Monitor blood pressure and symptoms. Encourage adequate hydration. -Continue current regimen of Losartan 50mg  daily and Metoprolol 25mg  daily.

## 2022-10-07 NOTE — Assessment & Plan Note (Signed)
Improved glycemic control with A1c down to 7.3 from 8.1. Patient is using a Dexcom sensor for glucose monitoring and reports occasional hypoglycemic symptoms. No significant hypoglycemic events reported. -chronic,well controlled, recommended goal of less than 8 for A1c given advanced age  -Continue current regimen of Glipizide 10mg  daily, Lantus 4 units BID, and Actos 45mg  daily. -Order labs to check kidney function and lipid panel.

## 2022-10-11 ENCOUNTER — Telehealth: Payer: Self-pay | Admitting: Family Medicine

## 2022-10-11 DIAGNOSIS — J301 Allergic rhinitis due to pollen: Secondary | ICD-10-CM

## 2022-10-11 MED ORDER — MONTELUKAST SODIUM 10 MG PO TABS
ORAL_TABLET | ORAL | 3 refills | Status: DC
Start: 2022-10-11 — End: 2023-08-01

## 2022-10-11 NOTE — Telephone Encounter (Signed)
[facility] faxed refill request for the following medications:  montelukast (SINGULAIR) 10 MG tablet    Please advise.  

## 2022-10-12 ENCOUNTER — Other Ambulatory Visit: Payer: Self-pay

## 2022-10-12 DIAGNOSIS — M546 Pain in thoracic spine: Secondary | ICD-10-CM | POA: Diagnosis not present

## 2022-10-12 DIAGNOSIS — M9903 Segmental and somatic dysfunction of lumbar region: Secondary | ICD-10-CM | POA: Diagnosis not present

## 2022-10-12 DIAGNOSIS — M9902 Segmental and somatic dysfunction of thoracic region: Secondary | ICD-10-CM | POA: Diagnosis not present

## 2022-10-12 DIAGNOSIS — M9901 Segmental and somatic dysfunction of cervical region: Secondary | ICD-10-CM | POA: Diagnosis not present

## 2022-10-12 DIAGNOSIS — E875 Hyperkalemia: Secondary | ICD-10-CM

## 2022-10-12 DIAGNOSIS — M531 Cervicobrachial syndrome: Secondary | ICD-10-CM | POA: Diagnosis not present

## 2022-10-12 DIAGNOSIS — M5137 Other intervertebral disc degeneration, lumbosacral region: Secondary | ICD-10-CM | POA: Diagnosis not present

## 2022-10-15 DIAGNOSIS — Z794 Long term (current) use of insulin: Secondary | ICD-10-CM | POA: Diagnosis not present

## 2022-10-15 DIAGNOSIS — E1165 Type 2 diabetes mellitus with hyperglycemia: Secondary | ICD-10-CM | POA: Diagnosis not present

## 2022-10-25 ENCOUNTER — Other Ambulatory Visit: Payer: Self-pay | Admitting: Family Medicine

## 2022-10-25 DIAGNOSIS — E875 Hyperkalemia: Secondary | ICD-10-CM | POA: Diagnosis not present

## 2022-10-26 LAB — COMPREHENSIVE METABOLIC PANEL
ALT: 14 IU/L (ref 0–44)
AST: 20 IU/L (ref 0–40)
Albumin: 4.2 g/dL (ref 3.8–4.8)
Alkaline Phosphatase: 71 IU/L (ref 44–121)
BUN/Creatinine Ratio: 19 (ref 10–24)
BUN: 20 mg/dL (ref 8–27)
Bilirubin Total: 0.8 mg/dL (ref 0.0–1.2)
CO2: 22 mmol/L (ref 20–29)
Calcium: 9.4 mg/dL (ref 8.6–10.2)
Chloride: 105 mmol/L (ref 96–106)
Creatinine, Ser: 1.03 mg/dL (ref 0.76–1.27)
Globulin, Total: 1.9 g/dL (ref 1.5–4.5)
Glucose: 179 mg/dL — ABNORMAL HIGH (ref 70–99)
Potassium: 5 mmol/L (ref 3.5–5.2)
Sodium: 141 mmol/L (ref 134–144)
Total Protein: 6.1 g/dL (ref 6.0–8.5)
eGFR: 74 mL/min/{1.73_m2} (ref >59)

## 2022-11-03 DIAGNOSIS — K5900 Constipation, unspecified: Secondary | ICD-10-CM | POA: Diagnosis not present

## 2022-11-10 ENCOUNTER — Other Ambulatory Visit: Payer: Medicare HMO | Admitting: Pharmacist

## 2022-11-10 NOTE — Progress Notes (Signed)
11/10/2022 Name: Carl Dominguez MRN: [id number] DOB: January 14, 1946  Chief Complaint  Patient presents with   Medication Management    Carl Dominguez is a 77 y.o. year old male who presented for a telephone visit.   They were referred to the pharmacist by their PCP for assistance in managing diabetes.      Subjective:   Care Team: Primary Care Provider: Ronnald Ramp, MD ; Next Scheduled Visit: 04/11/2023 Cardiologist: Mirian Capuchin, MD; Next Scheduled Visit: 11/25/2022 Gastroenterologist: Darryl Lent, MD  Medication Access/Adherence  Current Pharmacy:  [facility] Delivery - [place], [place] - [address] [place] [address] Phone: [phone] Fax: [phone]  [facility] DRUG STORE [phone] - [place], [place] - [address] AT [facility] [address] [place] [address] Phone: [phone] Fax: [phone]  [facility] DRUG STORE #[id number] [place], [place] - [address] AT [facility] [address] [place] [address] Phone: [phone] Fax: [phone]   Patient reports affordability concerns with their medications: No  Patient reports access/transportation concerns to their pharmacy: No  Patient reports adherence concerns with their medications:  No    Confirms staying well hydrated as recommended by PCP   Diabetes:   Current medications:  - glipizide 10 mg daily before breakfast - metformin ER 500 mg daily with breakfast - pioglitazone 45 mg daily - Lantus Solostar - 4 units in morning and 4 units in evening at bedtime   Reports uses Dexcom G7 continuous glucose monitor (CGM) with reader Patient reports data from reader from past 14- day report today: Average Glucose: 158 mg/dL Glucose Management Indicator: 7.1% Time in Goal:  - Time in range 70-180: 75% - Time above range: 24% - Time below range: 1% - Time very low: 0%   Denies symptoms of hypoglycemia.  Attributes reported low readings to sensor error. Confirms has checked blood sugar using glucometer when CGM alerting as low and readings >80 from fingerstick check   Current physical activity: reports stays active in the yard throughout the day (mowing, gardening, trimming). Reports also has membership to [employer] through [employer], but has not been in over a year   Statin therapy: atorvastatin 40 mg daily     Hypertension:   Current medications:  - losartan 50 mg daily - metoprolol ER 25 daily   Patient has an automated, upper arm home BP cuff Denies checking BP at home, but notes in office BP at appointment with GI specialist on 9/4 was 101/61, HR 70   Patient denies hypotensive s/sx including dizziness, lightheadedness as long as takes positional changes slowly   Objective:  Lab Results  Component Value Date   HGBA1C 7.3 (A) 10/07/2022    Lab Results  Component Value Date   CREATININE 1.03 10/25/2022   BUN 20 10/25/2022   NA 141 10/25/2022   K 5.0 10/25/2022   CL 105 10/25/2022   CO2 22 10/25/2022    Lab Results  Component Value Date   CHOL 135 10/07/2022   HDL 43 10/07/2022   LDLCALC 64 10/07/2022   TRIG 164 (H) 10/07/2022   CHOLHDL 3.1 10/07/2022   BP Readings from Last 3 Encounters:  10/07/22 90/60  07/06/22 119/73  06/28/22 104/64   Pulse Readings from Last 3 Encounters:  10/07/22 (!) 59  07/06/22 60  06/28/22 75     Medications Reviewed Today     Reviewed by  Courtnei Ruddell, Jackelyn Poling, RPH-CPP (Pharmacist) on 11/10/22 at 0900  Med List Status: <None>   Medication Order Taking? Sig Documenting Provider Last Dose Status Informant  acetaminophen (TYLENOL) 500 MG tablet [id number] No Take 1,000 mg by mouth 2 (two) times daily. [provider] Taking Active   aspirin 81 MG tablet [id number] No Take 81 mg by mouth daily.  [provider] Taking Active            Med Note Cyndia Bent   Thu Nov 14, 2014  8:06 AM) Received  from: [employer]  atorvastatin (LIPITOR) 40 MG tablet [id number] No TAKE 1 TABLET AT BEDTIME Simmons-Robinson, Makiera, MD Taking Active   Blood Glucose Calibration (TRUE METRIX LEVEL 1) Low SOLN [id number] No 1 each by In Vitro route daily. Bosie Clos, MD Taking Active   Blood Glucose Monitoring Suppl (FREESTYLE FREEDOM) KIT [id number] No by Does not apply route. [provider] Taking Active   cetirizine (ZYRTEC) 10 MG tablet [id number] No TAKE 1 TABLET EVERY DAY Bosie Clos, MD Taking Active   cholecalciferol (VITAMIN D) 1000 UNITS tablet [id number] No Take 1,000 Units by mouth daily.  [provider] Taking Active            Med Note Cyndia Bent   Thu Nov 14, 2014  8:06 AM) Received from: [facility]  famotidine (PEPCID) 20 MG tablet [id number] No Take 20 mg by mouth daily.  [provider] Taking Active            Med Note Cyndia Bent   Thu Nov 14, 2014  8:06 AM) Received from: [facility]  fluticasone [facility]) 50 MCG/ACT nasal spray [id number] No Place into both nostrils daily as needed. [provider] Taking Active   glipiZIDE (GLUCOTROL) 10 MG tablet [id number] No TAKE 1 TABLET EVERY DAY Simmons-Robinson, Makiera, MD Taking Active   insulin glargine (LANTUS) 100 UNIT/ML Solostar Pen [id number] No Inject 4 Units into the skin 2 (two) times daily. Simmons-Robinson, Makiera, MD Taking Active   Lactobacillus (PROBIOTIC ACIDOPHILUS PO) [id number] No Take 1 tablet by mouth daily. [provider] Taking Active   latanoprost (XALATAN) 0.005 % ophthalmic solution [id number] No Place 1 drop into both eyes at bedtime.  [provider] Taking Active            Med Note Cyndia Bent   Thu Nov 14, 2014  8:06 AM) Received from: [employer]  losartan (COZAAR) 50 MG tablet [id number] No TAKE 1 TABLET EVERY DAY Simmons-Robinson, Makiera, MD Taking Active    metFORMIN (GLUCOPHAGE-XR) 500 MG 24 hr tablet [id number] No Take 1 tablet (500 mg total) by mouth daily with breakfast. Simmons-Robinson, Makiera, MD Taking Active   metoprolol succinate (TOPROL-XL) 25 MG 24 hr tablet [id number] No TAKE 1 TABLET EVERY DAY Simmons-Robinson, Makiera, MD Taking Active   montelukast (SINGULAIR) 10 MG tablet [id number]  TAKE 1 TABLET AT BEDTIME Simmons-Robinson, Makiera, MD  Active   pioglitazone (ACTOS) 45 MG tablet [id number] No TAKE 1 TABLET EVERY DAY Simmons-Robinson, Makiera, MD Taking Active   polyethylene glycol (MIRALAX / GLYCOLAX) 17 g packet [id number] No Take 17 g by mouth daily as needed for mild constipation. [provider] Taking Active   psyllium (METAMUCIL SMOOTH TEXTURE) 28 % packet [id number] No Take 1 packet by mouth 2 (two) times daily.  [provider] Taking Active            Med Note (BYRD, [facility]  M   Thu Nov 14, 2014  8:06 AM) Received from: [employer]  timolol (TIMOPTIC) 0.5 % ophthalmic solution [id number] No 1 drop every morning. [provider] Taking Active   triamcinolone cream (KENALOG) 0.1 % [id number] No Apply 1 Application topically 2 (two) times daily as needed. [provider] Taking Active               Assessment/Plan:   Diabetes: - Reviewed long term cardiovascular and renal outcomes of uncontrolled blood sugar - Reviewed dietary modifications including: Encourage patient to have regular well-balance meals and snacks while controlling carbohydrate portion sizes Advise against skipping meals - Counsel patient on s/s of low blood sugar and how to treat lows Again encourage patient to pick up glucose tablets to carry with him - Recommend to continue to use Dexcom G7 continuous glucose monitor (CGM) to keep track of his blood sugar and use as feedback on dietary choices. Patient to continue to use fingerstick checks as needed as back up to CGM. - Patient again expresses  interest in having his son setup Dexcom G7/Clarity App on his smartphone to use in place of reader device             Have sent patient MyChart message with setup videos/information today as requested   Hypertension: - Currently controlled - Advise patient to take positional changes slowly - Encourage patient to keep a record when checks home blood pressure and heart rate and bring this record with him to medical appointments     Follow Up Plan: Clinical Pharmacist will follow up with patient by telephone on 02/09/2023 at 8:30 AM    Estelle Grumbles, PharmD, [facility] [phone]

## 2022-11-10 NOTE — Patient Instructions (Signed)
Goals Addressed             This Visit's Progress    Pharmacy Goals       It was a pleasure speaking with you today!   If you would like to use your smartphone in place of your Dexcom Reader, you can use the "How to get started with the Dexcom G7 app" video from the following website:   https://www.dexcom.com/training-videos   In order to share data you will need to use the Dexcom Clarity App:   [email or web address].[email or web address].com/clarity   The clinic code to connect data is: [email or web address]     Estelle Grumbles, PharmD, [facility] [phone]

## 2022-11-14 DIAGNOSIS — E1165 Type 2 diabetes mellitus with hyperglycemia: Secondary | ICD-10-CM | POA: Diagnosis not present

## 2022-11-14 DIAGNOSIS — Z794 Long term (current) use of insulin: Secondary | ICD-10-CM | POA: Diagnosis not present

## 2022-11-16 DIAGNOSIS — M5137 Other intervertebral disc degeneration, lumbosacral region: Secondary | ICD-10-CM | POA: Diagnosis not present

## 2022-11-16 DIAGNOSIS — M9902 Segmental and somatic dysfunction of thoracic region: Secondary | ICD-10-CM | POA: Diagnosis not present

## 2022-11-16 DIAGNOSIS — M531 Cervicobrachial syndrome: Secondary | ICD-10-CM | POA: Diagnosis not present

## 2022-11-16 DIAGNOSIS — M9903 Segmental and somatic dysfunction of lumbar region: Secondary | ICD-10-CM | POA: Diagnosis not present

## 2022-11-16 DIAGNOSIS — M546 Pain in thoracic spine: Secondary | ICD-10-CM | POA: Diagnosis not present

## 2022-11-16 DIAGNOSIS — M9901 Segmental and somatic dysfunction of cervical region: Secondary | ICD-10-CM | POA: Diagnosis not present

## 2022-11-22 DIAGNOSIS — E119 Type 2 diabetes mellitus without complications: Secondary | ICD-10-CM | POA: Diagnosis not present

## 2022-11-22 DIAGNOSIS — D3131 Benign neoplasm of right choroid: Secondary | ICD-10-CM | POA: Diagnosis not present

## 2022-11-22 DIAGNOSIS — H401121 Primary open-angle glaucoma, left eye, mild stage: Secondary | ICD-10-CM | POA: Diagnosis not present

## 2022-11-22 DIAGNOSIS — H401112 Primary open-angle glaucoma, right eye, moderate stage: Secondary | ICD-10-CM | POA: Diagnosis not present

## 2022-11-25 DIAGNOSIS — I10 Essential (primary) hypertension: Secondary | ICD-10-CM | POA: Diagnosis not present

## 2022-11-25 DIAGNOSIS — I519 Heart disease, unspecified: Secondary | ICD-10-CM | POA: Diagnosis not present

## 2022-11-25 DIAGNOSIS — I252 Old myocardial infarction: Secondary | ICD-10-CM | POA: Diagnosis not present

## 2022-11-25 DIAGNOSIS — Z955 Presence of coronary angioplasty implant and graft: Secondary | ICD-10-CM | POA: Diagnosis not present

## 2022-11-25 DIAGNOSIS — E119 Type 2 diabetes mellitus without complications: Secondary | ICD-10-CM | POA: Diagnosis not present

## 2022-11-25 DIAGNOSIS — I2510 Atherosclerotic heart disease of native coronary artery without angina pectoris: Secondary | ICD-10-CM | POA: Diagnosis not present

## 2022-11-25 DIAGNOSIS — K219 Gastro-esophageal reflux disease without esophagitis: Secondary | ICD-10-CM | POA: Diagnosis not present

## 2022-11-25 DIAGNOSIS — E7800 Pure hypercholesterolemia, unspecified: Secondary | ICD-10-CM | POA: Diagnosis not present

## 2022-11-25 DIAGNOSIS — R001 Bradycardia, unspecified: Secondary | ICD-10-CM | POA: Diagnosis not present

## 2022-12-23 DIAGNOSIS — L821 Other seborrheic keratosis: Secondary | ICD-10-CM | POA: Diagnosis not present

## 2022-12-23 DIAGNOSIS — L570 Actinic keratosis: Secondary | ICD-10-CM | POA: Diagnosis not present

## 2022-12-23 DIAGNOSIS — R21 Rash and other nonspecific skin eruption: Secondary | ICD-10-CM | POA: Diagnosis not present

## 2022-12-23 DIAGNOSIS — L814 Other melanin hyperpigmentation: Secondary | ICD-10-CM | POA: Diagnosis not present

## 2023-01-05 DIAGNOSIS — E1165 Type 2 diabetes mellitus with hyperglycemia: Secondary | ICD-10-CM | POA: Diagnosis not present

## 2023-01-05 DIAGNOSIS — Z794 Long term (current) use of insulin: Secondary | ICD-10-CM | POA: Diagnosis not present

## 2023-01-12 DIAGNOSIS — M9903 Segmental and somatic dysfunction of lumbar region: Secondary | ICD-10-CM | POA: Diagnosis not present

## 2023-01-12 DIAGNOSIS — M9902 Segmental and somatic dysfunction of thoracic region: Secondary | ICD-10-CM | POA: Diagnosis not present

## 2023-01-12 DIAGNOSIS — M546 Pain in thoracic spine: Secondary | ICD-10-CM | POA: Diagnosis not present

## 2023-01-12 DIAGNOSIS — M5137 Other intervertebral disc degeneration, lumbosacral region with discogenic back pain only: Secondary | ICD-10-CM | POA: Diagnosis not present

## 2023-01-12 DIAGNOSIS — M531 Cervicobrachial syndrome: Secondary | ICD-10-CM | POA: Diagnosis not present

## 2023-01-12 DIAGNOSIS — M9901 Segmental and somatic dysfunction of cervical region: Secondary | ICD-10-CM | POA: Diagnosis not present

## 2023-02-04 DIAGNOSIS — E1165 Type 2 diabetes mellitus with hyperglycemia: Secondary | ICD-10-CM | POA: Diagnosis not present

## 2023-02-04 DIAGNOSIS — Z794 Long term (current) use of insulin: Secondary | ICD-10-CM | POA: Diagnosis not present

## 2023-02-09 ENCOUNTER — Other Ambulatory Visit: Payer: Self-pay | Admitting: Pharmacist

## 2023-02-09 NOTE — Progress Notes (Signed)
02/09/2023 Name: Carl Dominguez MRN: [id number] DOB: [birth year]  Chief Complaint  Patient presents with   Medication Management    Carl Dominguez is a 77 y.o. year old male who presented for a telephone visit.   They were referred to the pharmacist by their PCP for assistance in managing diabetes.      Subjective:   Care Team: Primary Care Provider: Ronnald Ramp, MD ; Next Scheduled Visit: 04/11/2023 Cardiologist: Mirian Capuchin, MD; Next Scheduled Visit: 05/26/2023 Gastroenterologist: Darryl Lent, MD  Medication Access/Adherence  Current Pharmacy:  [facility] Delivery - [place], [place] - [address] [place] [address] Phone: [phone] Fax: [phone]  [facility] DRUG STORE [phone] - [place], [place] - [address] AT [facility] [address] [place] [address] Phone: [phone] Fax: [phone]  [facility] DRUG STORE #[id number] [place], [place] - [address] AT [facility] OF [address] [place] [address] Phone: [phone] Fax: [phone]   Patient reports affordability concerns with their medications: No  Patient reports access/transportation concerns to their pharmacy: No  Patient reports adherence concerns with their medications:  No      Diabetes:   Current medications:  - glipizide 10 mg daily before breakfast - metformin ER 500 mg daily with breakfast - pioglitazone 45 mg daily - Lantus Solostar - 4 units in morning and 4 units in evening at bedtime   Reports uses Dexcom G7 continuous glucose monitor (CGM) with reader Patient reports data from reader from past 14- day report today: Average Glucose: 148 mg/dL Glucose Management Indicator: 6.9% Time in Goal:  - Time in range 70-180: 72% - Time above range: 23% - Time below range: 3% - Time very low: 2%   Today reports with latest sensor has noticed an increase in his low glucose alarm going  off, but reports checks blood sugar using glucometer and readings >80 from fingerstick check.  - Denies symptoms of hypoglycemia  - Patient wonders if his new Fitbit could be impacting the accuracy of his sensor  Current physical activity: reports stays active in the yard throughout the day (mowing, gardening, trimming)   Statin therapy: atorvastatin 40 mg daily     Hypertension:   Current medications:  - losartan 50 mg daily - metoprolol ER 25 daily   Patient has an automated, upper arm home BP cuff Denies checking BP at home  Patient denies hypotensive s/sx including dizziness, lightheadedness as long as takes positional changes slowly   Objective:  Lab Results  Component Value Date   HGBA1C 7.3 (A) 10/07/2022    Lab Results  Component Value Date   CREATININE 1.03 10/25/2022   BUN 20 10/25/2022   NA 141 10/25/2022   K 5.0 10/25/2022   CL 105 10/25/2022   CO2 22 10/25/2022    Lab Results  Component Value Date   CHOL 135 10/07/2022   HDL 43 10/07/2022   LDLCALC 64 10/07/2022   TRIG 164 (H) 10/07/2022   CHOLHDL 3.1 10/07/2022   BP Readings from Last 3 Encounters:  10/07/22 90/60  07/06/22 119/73  06/28/22 104/64   Pulse Readings from Last 3 Encounters:  10/07/22 (!) 59  07/06/22 60  06/28/22 75     Medications Reviewed Today     Reviewed by Manuela Neptune, RPH-CPP (Pharmacist) on 02/09/23 at 1229  Med List Status: <None>   Medication Order Taking? Sig Documenting Provider  Last Dose Status Informant  acetaminophen (TYLENOL) 500 MG tablet [id number]  Take 1,000 mg by mouth 2 (two) times daily. [provider]  Active   aspirin 81 MG tablet [id number]  Take 81 mg by mouth daily.  [provider]  Active            Med Note Cyndia Bent   Thu Nov 14, 2014  8:06 AM) Received from: [employer]  atorvastatin (LIPITOR) 40 MG tablet [id number]  TAKE 1 TABLET AT BEDTIME Simmons-Robinson, Makiera, MD  Active   Blood  Glucose Calibration (TRUE METRIX LEVEL 1) Low SOLN [id number]  1 each by In Vitro route daily. Bosie Clos, MD  Active   Blood Glucose Monitoring Suppl (FREESTYLE FREEDOM) KIT [id number]  by Does not apply route. [provider]  Active   cetirizine (ZYRTEC) 10 MG tablet [id number]  TAKE 1 TABLET EVERY DAY Bosie Clos, MD  Active   cholecalciferol (VITAMIN D) 1000 UNITS tablet [id number]  Take 1,000 Units by mouth daily.  [provider]  Active            Med Note Cyndia Bent   Thu Nov 14, 2014  8:06 AM) Received from: [employer]  famotidine (PEPCID) 20 MG tablet [id number]  Take 20 mg by mouth daily.  [provider]  Active            Med Note Cyndia Bent   Thu Nov 14, 2014  8:06 AM) Received from: [facility]  fluticasone [facility]) 50 MCG/ACT nasal spray [id number]  Place into both nostrils daily as needed. [provider]  Active   glipiZIDE (GLUCOTROL) 10 MG tablet [id number] Yes TAKE 1 TABLET EVERY DAY Simmons-Robinson, Makiera, MD Taking Active   insulin glargine (LANTUS) 100 UNIT/ML Solostar Pen [id number] Yes Inject 4 Units into the skin 2 (two) times daily. Simmons-Robinson, Makiera, MD Taking Active   Lactobacillus (PROBIOTIC ACIDOPHILUS PO) [id number]  Take 1 tablet by mouth daily. [provider]  Active   latanoprost (XALATAN) 0.005 % ophthalmic solution [id number]  Place 1 drop into both eyes at bedtime.  [provider]  Active            Med Note Cyndia Bent   Thu Nov 14, 2014  8:06 AM) Received from: [employer]  losartan (COZAAR) 50 MG tablet [id number]  TAKE 1 TABLET EVERY DAY Simmons-Robinson, Makiera, MD  Active   metFORMIN (GLUCOPHAGE-XR) 500 MG 24 hr tablet [id number] Yes Take 1 tablet (500 mg total) by mouth daily with breakfast. Simmons-Robinson, Makiera, MD Taking Active   metoprolol succinate (TOPROL-XL) 25 MG 24 hr tablet [id number]  TAKE 1  TABLET EVERY DAY Simmons-Robinson, Makiera, MD  Active   montelukast (SINGULAIR) 10 MG tablet [id number]  TAKE 1 TABLET AT BEDTIME Simmons-Robinson, Makiera, MD  Active   pioglitazone (ACTOS) 45 MG tablet [id number] Yes TAKE 1 TABLET EVERY DAY Simmons-Robinson, Makiera, MD Taking Active   polyethylene glycol (MIRALAX / GLYCOLAX) 17 g packet [id number]  Take 17 g by mouth daily as needed for mild constipation. [provider]  Active   psyllium (METAMUCIL SMOOTH TEXTURE) 28 % packet [id number]  Take 1 packet by mouth 2 (two) times daily.  [provider]  Active            Med Note Cyndia Bent   Thu Nov 14, 2014  8:06 AM) Received from: [employer]  timolol (TIMOPTIC) 0.5 % ophthalmic solution  [id number]  1 drop every morning. [provider]  Active   triamcinolone cream (KENALOG) 0.1 % [id number]  Apply 1 Application topically 2 (two) times daily as needed. [provider]  Active               Assessment/Plan:   Diabetes: - Patient to follow up with Dexcom support today to discuss recent increase in low CGM alarms with latest sensor despite blood sugar readings being in range when checks with glucometer. Provide patient with customer support phone number - Reviewed long term cardiovascular and renal outcomes of uncontrolled blood sugar - Reviewed dietary modifications including: Encourage patient to have regular well-balance meals and snacks while controlling carbohydrate portion sizes Advise against skipping meals. Discuss having a balanced snack at bedtime to help prevent any overnight low blood sugar readings - Counsel patient on s/s of low blood sugar and how to treat lows Again encourage patient to have glucose tablets to carry with him - Recommend to continue to use Dexcom G7 continuous glucose monitor (CGM) to keep track of his blood sugar and use as feedback on dietary choices. Patient to continue to use fingerstick checks as  needed as back up to CGM. Patient to contact office if needed for any symptoms or if has any episodes of hypoglycemia   Hypertension: - Currently controlled - Advise patient to take positional changes slowly - Encourage patient to keep a record when checks home blood pressure and heart rate and bring this record with him to medical appointments     Follow Up Plan: Clinical Pharmacist will follow up with patient by telephone on 05/11/2023 at 8:30 AM    Estelle Grumbles, PharmD, [facility] [phone]

## 2023-02-09 NOTE — Patient Instructions (Signed)
Goals Addressed             This Visit's Progress    Pharmacy Goals       The goal A1c is less than 7%. This is the best way to reduce the risk of the long term complications of diabetes, including heart disease, kidney disease, eye disease, strokes, and nerve damage. An A1c of less than 7% corresponds with fasting sugars of 80-130 and 2 hour after meal sugars of 80-180. Please continue to monitor your blood sugar closely and contact the office if needed for any low blood sugar readings or other symptoms.   If you would like to use your smartphone in place of your Dexcom Reader, you can use the "How to get started with the Dexcom G7 app" video from the following website:   https://www.dexcom.com/training-videos   In order to share data you will need to use the Dexcom Clarity App:   [email or web address].dexcom.com/clarity   The clinic code to connect data is: [email or web address]     Estelle Grumbles, PharmD, [facility] [phone]

## 2023-03-06 DIAGNOSIS — Z794 Long term (current) use of insulin: Secondary | ICD-10-CM | POA: Diagnosis not present

## 2023-03-06 DIAGNOSIS — E1165 Type 2 diabetes mellitus with hyperglycemia: Secondary | ICD-10-CM | POA: Diagnosis not present

## 2023-03-07 ENCOUNTER — Other Ambulatory Visit: Payer: Self-pay | Admitting: Family Medicine

## 2023-03-09 DIAGNOSIS — M546 Pain in thoracic spine: Secondary | ICD-10-CM | POA: Diagnosis not present

## 2023-03-09 DIAGNOSIS — M545 Low back pain, unspecified: Secondary | ICD-10-CM | POA: Diagnosis not present

## 2023-03-09 DIAGNOSIS — M9903 Segmental and somatic dysfunction of lumbar region: Secondary | ICD-10-CM | POA: Diagnosis not present

## 2023-03-09 DIAGNOSIS — M9901 Segmental and somatic dysfunction of cervical region: Secondary | ICD-10-CM | POA: Diagnosis not present

## 2023-03-09 DIAGNOSIS — M9902 Segmental and somatic dysfunction of thoracic region: Secondary | ICD-10-CM | POA: Diagnosis not present

## 2023-03-09 DIAGNOSIS — M531 Cervicobrachial syndrome: Secondary | ICD-10-CM | POA: Diagnosis not present

## 2023-04-05 DIAGNOSIS — E1165 Type 2 diabetes mellitus with hyperglycemia: Secondary | ICD-10-CM | POA: Diagnosis not present

## 2023-04-05 DIAGNOSIS — Z794 Long term (current) use of insulin: Secondary | ICD-10-CM | POA: Diagnosis not present

## 2023-04-06 DIAGNOSIS — M9903 Segmental and somatic dysfunction of lumbar region: Secondary | ICD-10-CM | POA: Diagnosis not present

## 2023-04-06 DIAGNOSIS — M9901 Segmental and somatic dysfunction of cervical region: Secondary | ICD-10-CM | POA: Diagnosis not present

## 2023-04-06 DIAGNOSIS — M531 Cervicobrachial syndrome: Secondary | ICD-10-CM | POA: Diagnosis not present

## 2023-04-06 DIAGNOSIS — M546 Pain in thoracic spine: Secondary | ICD-10-CM | POA: Diagnosis not present

## 2023-04-06 DIAGNOSIS — M9902 Segmental and somatic dysfunction of thoracic region: Secondary | ICD-10-CM | POA: Diagnosis not present

## 2023-04-06 DIAGNOSIS — M545 Low back pain, unspecified: Secondary | ICD-10-CM | POA: Diagnosis not present

## 2023-04-08 ENCOUNTER — Other Ambulatory Visit: Payer: Self-pay | Admitting: Pharmacist

## 2023-04-08 NOTE — Progress Notes (Signed)
   04/08/2023  Patient ID: Carl Dominguez, male   DOB: 1946/01/19, 78 y.o.   MRN: [id number]  Receive call from patient requesting a call back to confirm our next appointment date/time Return call to patient to confirm upcoming appointment  During our call, patient shares that he is frustrated with errors from his Dexcom CGM  Discuss selecting placement of the sensor on back of his arm. Discuss alternative placement site option with Dexcom G6.   Patient also plans to ask PCP to look at his sensor placement during upcoming in person appointment  Also again encourage patient to consider contacting [facility] Customer Support [phone]) for help with troubleshooting sensor errors. Note has already contacted support to request replacement sensors  Sharyle Sia, PharmD, [facility] [phone]

## 2023-04-11 ENCOUNTER — Encounter: Payer: Self-pay | Admitting: Family Medicine

## 2023-04-11 ENCOUNTER — Ambulatory Visit
Admission: RE | Admit: 2023-04-11 | Discharge: 2023-04-11 | Disposition: A | Discharge status: Home / Self Care | Payer: Medicare HMO | Attending: Family Medicine | Admitting: Family Medicine

## 2023-04-11 ENCOUNTER — Ambulatory Visit (INDEPENDENT_AMBULATORY_CARE_PROVIDER_SITE_OTHER): Payer: Medicare HMO | Admitting: Family Medicine

## 2023-04-11 ENCOUNTER — Ambulatory Visit
Admission: RE | Admit: 2023-04-11 | Discharge: 2023-04-11 | Disposition: A | Discharge status: Home / Self Care | Payer: Medicare HMO | Source: Ambulatory Visit | Attending: Family Medicine

## 2023-04-11 VITALS — BP 135/79 | HR 64 | Ht 67.0 in | Wt 161.0 lb

## 2023-04-11 DIAGNOSIS — E875 Hyperkalemia: Secondary | ICD-10-CM | POA: Diagnosis not present

## 2023-04-11 DIAGNOSIS — Z9861 Coronary angioplasty status: Secondary | ICD-10-CM | POA: Diagnosis not present

## 2023-04-11 DIAGNOSIS — N183 Chronic kidney disease, stage 3 unspecified: Secondary | ICD-10-CM | POA: Diagnosis not present

## 2023-04-11 DIAGNOSIS — M19042 Primary osteoarthritis, left hand: Secondary | ICD-10-CM | POA: Insufficient documentation

## 2023-04-11 DIAGNOSIS — E1159 Type 2 diabetes mellitus with other circulatory complications: Secondary | ICD-10-CM

## 2023-04-11 DIAGNOSIS — I152 Hypertension secondary to endocrine disorders: Secondary | ICD-10-CM | POA: Diagnosis not present

## 2023-04-11 DIAGNOSIS — E7800 Pure hypercholesterolemia, unspecified: Secondary | ICD-10-CM

## 2023-04-11 DIAGNOSIS — M1812 Unilateral primary osteoarthritis of first carpometacarpal joint, left hand: Secondary | ICD-10-CM | POA: Diagnosis not present

## 2023-04-11 DIAGNOSIS — E0822 Diabetes mellitus due to underlying condition with diabetic chronic kidney disease: Secondary | ICD-10-CM | POA: Diagnosis not present

## 2023-04-11 DIAGNOSIS — Z794 Long term (current) use of insulin: Secondary | ICD-10-CM | POA: Diagnosis not present

## 2023-04-11 DIAGNOSIS — I2510 Atherosclerotic heart disease of native coronary artery without angina pectoris: Secondary | ICD-10-CM | POA: Diagnosis not present

## 2023-04-11 DIAGNOSIS — M79642 Pain in left hand: Secondary | ICD-10-CM | POA: Diagnosis not present

## 2023-04-11 MED ORDER — GABAPENTIN 100 MG PO CAPS: 100.0000 mg | ORAL_CAPSULE | Freq: Three times a day (TID) | ORAL | 3 refills | Status: DC

## 2023-04-11 MED ORDER — GABAPENTIN 100 MG PO CAPS: 100.0000 mg | ORAL_CAPSULE | Freq: Three times a day (TID) | ORAL | 3 refills | Status: AC

## 2023-04-11 MED ORDER — METFORMIN HCL ER 500 MG PO TB24: 500.0000 mg | ORAL_TABLET | Freq: Every day | ORAL | 3 refills | Status: AC

## 2023-04-11 NOTE — Assessment & Plan Note (Signed)
 Well controlled with losartan  50 mg daily and metoprolol  25 mg daily. Blood pressure today is 135/79 mmHg. Chronic, well controlled  - Continue current antihypertensive medications

## 2023-04-11 NOTE — Assessment & Plan Note (Signed)
 Managed with aspirin 81 mg daily and metoprolol  25 mg daily. No new symptoms reported. Chronic - Continue current CAD medications

## 2023-04-11 NOTE — Assessment & Plan Note (Signed)
 Managed with Dexcom G7 sensor, glipizide  10 mg daily, Lantus  4 units twice daily, metformin  500 mg daily, and Actos  45 mg daily. Last A1c in August was 7.3%. Reports false low readings from Dexcom G7 causing sleep disturbances. Prefers not to return to finger pricking. Discussed potential switch to [employer] but noted insurance coverage issues. Plan to recheck A1c today. Emphasized maintaining A1c under 8% and potential benefits of switching to a different continuous glucose monitor to improve quality of life. Chronic - Order updated A1c - Perform diabetic foot exam - Order urinary microalbumin - request records for updated diabetes eye exam

## 2023-04-11 NOTE — Progress Notes (Signed)
 Established patient visit   Patient: Carl Dominguez   DOB: [birth year]   78 y.o. Male  MRN: [id number] Visit Date: 04/11/2023  Today's healthcare provider: Mimi Alt, MD   Chief Complaint  Patient presents with   Medical Management of Chronic Issues   Subjective       Discussed the use of AI scribe software for clinical note transcription with the patient, who gave verbal consent to proceed.  History of Present Illness   Carl Dominguez is an 78 year old male with hypertension and type 2 diabetes who presents for chronic follow-up.  He manages his type 2 diabetes with a Dexcom G7 sensor, glipizide  10 mg daily, Lantus  4 units twice daily, metformin  500 mg daily, and Actos  45 mg daily. His last A1c in August was 7.3. He experiences issues with the Dexcom sensor, which often alerts him to low blood sugar levels during the night, disrupting his sleep. There are discrepancies between the sensor readings and his glucometer, with the sensor indicating low levels while his glucometer shows normal or high levels. He prefers not to return to finger pricking due to the inconvenience.  His hypertension is managed with losartan  50 mg daily and metoprolol  25 mg daily. His blood pressure today is reported as well controlled at 135/79.  He has a history of coronary artery disease managed with aspirin 81 mg daily and metoprolol  25 mg daily. He underwent a percutaneous coronary angioplasty in the past. No new symptoms are reported.  His hyperlipidemia is managed with atorvastatin  40 mg daily.  He reports pain in his left hand, particularly affecting the ring finger, which is exacerbated by certain movements such as opening a car door. The pain is significant and sometimes causes his hand to 'go to sleep'. He also mentions difficulty sleeping on his back and prefers to sleep on his side.  He experiences arthritis-related pain in his feet, which occurs intermittently during walks.  The pain subsides when he slows down his pace. He takes two Tylenol twice a day to manage pain, which helps prevent pain from occurring.       March scheduled for eye exam 3/18 and 3/25  04/20/23: AWV    Past Medical History:  Diagnosis Date   Colon polyp    Coronary artery disease    Diabetes mellitus without complication (HCC)    Dyspnea    Dysrhythmia    GERD (gastroesophageal reflux disease)    Heart disease    Hemorrhoids    Hyperlipidemia    Hypertension    Myocardial infarction (HCC) 1999    Medications: Outpatient Medications Prior to Visit  Medication Sig   acetaminophen (TYLENOL) 500 MG tablet Take 1,000 mg by mouth 2 (two) times daily.   aspirin 81 MG tablet Take 81 mg by mouth daily.    atorvastatin  (LIPITOR) 40 MG tablet TAKE 1 TABLET AT BEDTIME   Blood Glucose Calibration (TRUE METRIX LEVEL 1) Low SOLN 1 each by In Vitro route daily.   Blood Glucose Monitoring Suppl (FREESTYLE FREEDOM) KIT by Does not apply route.   cetirizine  (ZYRTEC ) 10 MG tablet TAKE 1 TABLET EVERY DAY   cholecalciferol (VITAMIN D) 1000 UNITS tablet Take 1,000 Units by mouth daily.    Continuous Glucose Sensor (DEXCOM G7 SENSOR) MISC by Does not apply route.   famotidine (PEPCID) 20 MG tablet Take 20 mg by mouth daily.    fluticasone  (FLONASE ) 50 MCG/ACT nasal spray Place into both nostrils daily as  needed.   glipiZIDE  (GLUCOTROL ) 10 MG tablet TAKE 1 TABLET EVERY DAY   insulin  glargine (LANTUS ) 100 UNIT/ML Solostar Pen Inject 4 Units into the skin 2 (two) times daily.   Lactobacillus (PROBIOTIC ACIDOPHILUS PO) Take 1 tablet by mouth daily.   latanoprost (XALATAN) 0.005 % ophthalmic solution Place 1 drop into both eyes at bedtime.    losartan  (COZAAR ) 50 MG tablet TAKE 1 TABLET EVERY DAY   metoprolol  succinate (TOPROL -XL) 25 MG 24 hr tablet TAKE 1 TABLET EVERY DAY   montelukast  (SINGULAIR ) 10 MG tablet TAKE 1 TABLET AT BEDTIME   pioglitazone  (ACTOS ) 45 MG tablet TAKE 1 TABLET EVERY DAY    polyethylene glycol (MIRALAX / GLYCOLAX) 17 g packet Take 17 g by mouth daily as needed for mild constipation.   psyllium (METAMUCIL SMOOTH TEXTURE) 28 % packet Take 1 packet by mouth 2 (two) times daily.    timolol  (TIMOPTIC ) 0.5 % ophthalmic solution 1 drop every morning.   triamcinolone cream (KENALOG) 0.1 % Apply 1 Application topically 2 (two) times daily as needed.   [DISCONTINUED] metFORMIN  (GLUCOPHAGE -XR) 500 MG 24 hr tablet Take 1 tablet (500 mg total) by mouth daily with breakfast.   No facility-administered medications prior to visit.    Review of Systems  Last metabolic panel Lab Results  Component Value Date   GLUCOSE 179 (H) 10/25/2022   NA 141 10/25/2022   K 5.0 10/25/2022   CL 105 10/25/2022   CO2 22 10/25/2022   BUN 20 10/25/2022   CREATININE 1.03 10/25/2022   EGFR 74 10/25/2022   CALCIUM  9.4 10/25/2022   PROT 6.1 10/25/2022   ALBUMIN 4.2 10/25/2022   LABGLOB 1.9 10/25/2022   AGRATIO 2.3 (H) 08/11/2021   BILITOT 0.8 10/25/2022   ALKPHOS 71 10/25/2022   AST 20 10/25/2022   ALT 14 10/25/2022   Last lipids Lab Results  Component Value Date   CHOL 135 10/07/2022   HDL 43 10/07/2022   LDLCALC 64 10/07/2022   TRIG 164 (H) 10/07/2022   CHOLHDL 3.1 10/07/2022   Last hemoglobin A1c Lab Results  Component Value Date   HGBA1C 7.3 (A) 10/07/2022        Objective    BP 135/79   Pulse 64   Ht 5\' 7"  (1.702 m)   Wt 161 lb (73 kg)   BMI 25.22 kg/m  BP Readings from Last 3 Encounters:  04/11/23 135/79  10/07/22 90/60  07/06/22 119/73   Wt Readings from Last 3 Encounters:  04/11/23 161 lb (73 kg)  10/07/22 159 lb 12.8 oz (72.5 kg)  07/06/22 160 lb 11.2 oz (72.9 kg)        Physical Exam  General: Alert, no acute distress Cardio: Normal S1 and S2, RRR, no r/m/g Pulm: CTAB, normal work of breathing MSK: no deformities, no swelling of left hand, normal ROM, no erythema   No results found for any visits on 04/11/23.  Assessment & Plan      Problem List Items Addressed This Visit       Cardiovascular and Mediastinum   Hypertension associated with diabetes (HCC) - Primary   Well controlled with losartan  50 mg daily and metoprolol  25 mg daily. Blood pressure today is 135/79 mmHg. Chronic, well controlled  - Continue current antihypertensive medications      Relevant Medications   metFORMIN  (GLUCOPHAGE -XR) 500 MG 24 hr tablet (Start on 07/04/2023)   Other Relevant Orders   BMP8+EGFR   CAD S/P percutaneous coronary angioplasty   Managed with  aspirin 81 mg daily and metoprolol  25 mg daily. No new symptoms reported. Chronic - Continue current CAD medications        Endocrine   Diabetes mellitus due to underlying condition with chronic kidney disease, with long-term current use of insulin  (HCC)   Managed with Dexcom G7 sensor, glipizide  10 mg daily, Lantus  4 units twice daily, metformin  500 mg daily, and Actos  45 mg daily. Last A1c in August was 7.3%. Reports false low readings from Dexcom G7 causing sleep disturbances. Prefers not to return to finger pricking. Discussed potential switch to [employer] but noted insurance coverage issues. Plan to recheck A1c today. Emphasized maintaining A1c under 8% and potential benefits of switching to a different continuous glucose monitor to improve quality of life. Chronic - Order updated A1c - Perform diabetic foot exam - Order urinary microalbumin - request records for updated diabetes eye exam      Relevant Medications   metFORMIN  (GLUCOPHAGE -XR) 500 MG 24 hr tablet (Start on 07/04/2023)   Other Relevant Orders   Microalbumin / creatinine urine ratio   Hemoglobin A1c     Musculoskeletal and Integument   Arthritis of left hand   Pain in the ring finger with numbness and tingling. Possible arthritis or nerve irritation. Discussed gabapentin  for pain management, starting at 100 mg at bedtime to minimize side effects. If no improvement, consider referral to orthopedics for  further evaluation and possible injection therapy. Chronic, worsening - Order x-ray of left hand - Prescribe gabapentin  100 mg at bedtime - Consider referral to orthopedics if no improvement      Relevant Medications   gabapentin  (NEURONTIN ) 100 MG capsule   Other Relevant Orders   DG Hand Complete Left     Other   Hyperlipidemia   Managed with atorvastatin  40 mg daily. No new symptoms reported. Chronic - Continue current hyperlipidemia medication          Follow-up - Follow up in June for chronic conditions - Schedule x-ray at outpatient imaging center - Send metformin  prescription to [facility] pharmacy after current refill at [facility] is used.         Return in about 4 months (around 08/09/2023) for DM, HTN.         Mimi Alt, MD  [facility] [phone] (phone) [phone] (fax)  [facility]

## 2023-04-11 NOTE — Assessment & Plan Note (Signed)
 Managed with atorvastatin  40 mg daily. No new symptoms reported. Chronic - Continue current hyperlipidemia medication

## 2023-04-11 NOTE — Assessment & Plan Note (Signed)
 Pain in the ring finger with numbness and tingling. Possible arthritis or nerve irritation. Discussed gabapentin  for pain management, starting at 100 mg at bedtime to minimize side effects. If no improvement, consider referral to orthopedics for further evaluation and possible injection therapy. Chronic, worsening - Order x-ray of left hand - Prescribe gabapentin  100 mg at bedtime - Consider referral to orthopedics if no improvement

## 2023-04-11 NOTE — Patient Instructions (Addendum)
 Please report to [facility] located at:  [address]  [place], [place] [address]  You do not need an appointment to have xrays completed.   Our office will follow up with  results once available.   VISIT SUMMARY:  Today, we reviewed your ongoing health conditions, including type 2 diabetes, hypertension, coronary artery disease, and hyperlipidemia. We also addressed your concerns about left hand pain and sleep disturbances caused by your Dexcom G7 sensor. We discussed potential changes to your diabetes management and pain relief options for your hand.  INSTRUCTIONS:  Please follow up in June for your chronic conditions. Schedule an x-ray of your left hand at the outpatient imaging center. We will send your metformin  prescription to [facility] pharmacy after you use your current refill at [facility].

## 2023-04-12 LAB — BMP8+EGFR
BUN/Creatinine Ratio: 16 (ref 10–24)
BUN: 19 mg/dL (ref 8–27)
CO2: 20 mmol/L (ref 20–29)
Calcium: 9.5 mg/dL (ref 8.6–10.2)
Chloride: 102 mmol/L (ref 96–106)
Creatinine, Ser: 1.17 mg/dL (ref 0.76–1.27)
Glucose: 145 mg/dL — ABNORMAL HIGH (ref 70–99)
Potassium: 5.4 mmol/L — ABNORMAL HIGH (ref 3.5–5.2)
Sodium: 141 mmol/L (ref 134–144)
eGFR: 63 mL/min/{1.73_m2} (ref >59)

## 2023-04-12 LAB — MICROALBUMIN / CREATININE URINE RATIO
Creatinine, Urine: 159.8 mg/dL
Microalb/Creat Ratio: 8 mg/g{creat} (ref 0–29)
Microalbumin, Urine: 12.8 ug/mL

## 2023-04-12 LAB — HEMOGLOBIN A1C
Est. average glucose Bld gHb Est-mCnc: 166 mg/dL
Hgb A1c MFr Bld: 7.4 % — ABNORMAL HIGH (ref 4.8–5.6)

## 2023-04-13 ENCOUNTER — Encounter: Payer: Self-pay | Admitting: Family Medicine

## 2023-04-13 NOTE — Addendum Note (Signed)
Addended by: Bing Neighbors on: 04/13/2023 08:03 AM   Modules accepted: Orders

## 2023-04-15 ENCOUNTER — Telehealth: Payer: Self-pay | Admitting: Family Medicine

## 2023-04-15 NOTE — Telephone Encounter (Signed)
[facility] faxed refill request for the following medications:  True Metrix level 1 ctrl soln  Putting fax in [facility] box since it is diabetic supplies.  Not sure if this is what the pt is referring to.  Please advise.

## 2023-04-15 NOTE — Telephone Encounter (Signed)
Pt is calling in because he wanted to let the office know that the pharmacy is about to fax over something regarding one of his prescriptions and he wanted to make sure it was received.

## 2023-04-19 ENCOUNTER — Other Ambulatory Visit: Payer: Self-pay | Admitting: Family Medicine

## 2023-04-19 MED ORDER — TRUE METRIX LEVEL 1 LOW VI SOLN: 1.0000 | Freq: Every day | 5 refills | Status: AC

## 2023-04-20 ENCOUNTER — Ambulatory Visit: Payer: Medicare HMO | Admitting: Emergency Medicine

## 2023-04-20 VITALS — Ht 67.0 in | Wt 161.0 lb

## 2023-04-20 DIAGNOSIS — Z0000 Encounter for general adult medical examination without abnormal findings: Secondary | ICD-10-CM | POA: Diagnosis not present

## 2023-04-20 DIAGNOSIS — E0822 Diabetes mellitus due to underlying condition with diabetic chronic kidney disease: Secondary | ICD-10-CM

## 2023-04-20 NOTE — Progress Notes (Signed)
Subjective:   Carl Dominguez is a 78 y.o. male who presents for Medicare Annual/Subsequent preventive examination.  This patient declined Interactive audio and Acupuncturist. Therefore the visit was completed with audio only.   Visit Complete: Virtual I connected with  Carl Dominguez on 04/20/23 by a audio enabled telemedicine application and verified that I am speaking with the correct person using two identifiers.  Patient Location: Home  Provider Location: Home Office  I discussed the limitations of evaluation and management by telemedicine. The patient expressed understanding and agreed to proceed.  Vital Signs: Because this visit was a virtual/telehealth visit, some criteria may be missing or patient reported. Any vitals not documented were not able to be obtained and vitals that have been documented are patient reported.   Cardiac Risk Factors include: advanced age (>75men, >34 women);male gender;hypertension;diabetes mellitus;dyslipidemia;Other (see comment), Risk factor comments: CAD     Objective:    Today's Vitals   04/20/23 1344  Weight: 161 lb (73 kg)  Height: 5\' 7"  (1.702 m)   Body mass index is 25.22 kg/m.     04/20/2023    2:05 PM 06/28/2022    9:24 AM 04/13/2022    1:19 PM 10/12/2021    8:12 AM 04/09/2021    1:09 PM 04/03/2020    9:53 AM 04/02/2019    9:38 AM  Advanced Directives  Does Patient Have a Medical Advance Directive? Yes Yes Yes Yes Yes Yes No  Type of Estate agent of [place];Living will Healthcare Power of [employer] of [place];Living will Healthcare Power of [employer] of [place];Living will   Does patient want to make changes to medical advance directive? No - Patient declined   No - Patient declined Yes (Inpatient - patient defers changing a medical advance directive and declines information at this time)    Copy of Healthcare Power of Attorney in Chart? Yes - validated most recent  copy scanned in chart (See row information) No - copy requested  Yes - validated most recent copy scanned in chart (See row information) Yes - validated most recent copy scanned in chart (See row information) No - copy requested   Would patient like information on creating a medical advance directive?       No - Patient declined    Current Medications (verified) Outpatient Encounter Medications as of 04/20/2023  Medication Sig   acetaminophen (TYLENOL) 500 MG tablet Take 1,000 mg by mouth 2 (two) times daily.   aspirin 81 MG tablet Take 81 mg by mouth daily.    atorvastatin (LIPITOR) 40 MG tablet TAKE 1 TABLET AT BEDTIME   Blood Glucose Calibration (TRUE METRIX LEVEL 1) Low SOLN 1 each by In Vitro route daily.   Blood Glucose Monitoring Suppl (FREESTYLE FREEDOM) KIT by Does not apply route.   cetirizine (ZYRTEC) 10 MG tablet TAKE 1 TABLET EVERY DAY   cholecalciferol (VITAMIN D) 1000 UNITS tablet Take 1,000 Units by mouth daily.    Continuous Glucose Sensor (DEXCOM G7 SENSOR) MISC by Does not apply route.   famotidine (PEPCID) 20 MG tablet Take 20 mg by mouth daily.    fluticasone (FLONASE) 50 MCG/ACT nasal spray Place into both nostrils daily as needed.   gabapentin (NEURONTIN) 100 MG capsule Take 1 capsule (100 mg total) by mouth 3 (three) times daily.   glipiZIDE (GLUCOTROL) 10 MG tablet TAKE 1 TABLET EVERY DAY   insulin glargine (LANTUS) 100 UNIT/ML Solostar Pen Inject 4 Units into the  skin 2 (two) times daily.   Lactobacillus (PROBIOTIC ACIDOPHILUS PO) Take 1 tablet by mouth daily.   latanoprost (XALATAN) 0.005 % ophthalmic solution Place 1 drop into both eyes at bedtime.    losartan (COZAAR) 50 MG tablet TAKE 1 TABLET EVERY DAY   [START ON 07/04/2023] metFORMIN (GLUCOPHAGE-XR) 500 MG 24 hr tablet Take 1 tablet (500 mg total) by mouth daily with breakfast.   metoprolol succinate (TOPROL-XL) 25 MG 24 hr tablet TAKE 1 TABLET EVERY DAY   montelukast (SINGULAIR) 10 MG tablet TAKE 1 TABLET AT  BEDTIME   pioglitazone (ACTOS) 45 MG tablet TAKE 1 TABLET EVERY DAY   polyethylene glycol (MIRALAX / GLYCOLAX) 17 g packet Take 17 g by mouth daily as needed for mild constipation.   psyllium (METAMUCIL SMOOTH TEXTURE) 28 % packet Take 1 packet by mouth 2 (two) times daily.    timolol (TIMOPTIC) 0.5 % ophthalmic solution 1 drop every morning.   triamcinolone cream (KENALOG) 0.1 % Apply 1 Application topically 2 (two) times daily as needed. (Patient not taking: Reported on 04/20/2023)   No facility-administered encounter medications on file as of 04/20/2023.    Allergies (verified) Patient has no known allergies.   History: Past Medical History:  Diagnosis Date   Colon polyp    Coronary artery disease    Diabetes mellitus without complication (HCC)    Dyspnea    Dysrhythmia    GERD (gastroesophageal reflux disease)    Heart disease    Hemorrhoids    Hyperlipidemia    Hypertension    Myocardial infarction [facility]) 1999   Past Surgical History:  Procedure Laterality Date   CATARACT EXTRACTION W/PHACO Right 03/24/2016   Procedure: CATARACT EXTRACTION PHACO AND INTRAOCULAR LENS PLACEMENT (IOC)  Right;  Surgeon: Lockie Mola, MD;  Location: [facility];  Service: Ophthalmology;  Laterality: Right;  Diabetic - oral meds right eye   CATARACT EXTRACTION W/PHACO Left 10/12/2021   Procedure: CATARACT EXTRACTION PHACO AND INTRAOCULAR LENS PLACEMENT (IOC) LEFT 7.86 01:08.4;  Surgeon: Lockie Mola, MD;  Location: [facility];  Service: Ophthalmology;  Laterality: Left;   CHOLECYSTECTOMY  03/01/2008   COLONOSCOPY     COLONOSCOPY WITH PROPOFOL N/A 06/28/2022   Procedure: COLONOSCOPY WITH PROPOFOL;  Surgeon: [name], MD;  Location: [facility];  Service: Endoscopy;  Laterality: N/A;   CORONARY ANGIOPLASTY WITH STENT PLACEMENT  03/01/1997   ESOPHAGOGASTRODUODENOSCOPY (EGD) WITH PROPOFOL N/A 06/28/2022   Procedure: ESOPHAGOGASTRODUODENOSCOPY (EGD) WITH  PROPOFOL;  Surgeon: [name], MD;  Location: [facility];  Service: Endoscopy;  Laterality: N/A;   EYE SURGERY     HERNIA REPAIR     Family History  Problem Relation Age of Onset   Congestive Heart Failure Mother    Suicidality Father 36   Social History   Socioeconomic History   Marital status: Married    Spouse name: Lupita Leash   Number of children: 2   Years of education: Not on file   Highest education level: 12th grade  Occupational History   Occupation: retired  Tobacco Use   Smoking status: Former    Current packs/day: 0.00    Average packs/day: 1 pack/day for 18.0 years (18.0 ttl pk-yrs)    Types: Cigarettes    Start date: 1966    Quit date: 03/01/1982    Years since quitting: 41.1   Smokeless tobacco: Never   Tobacco comments:    has been quit for 30 years  Vaping Use   Vaping status: Never Used  Substance and Sexual Activity   Alcohol use: Yes    Comment: occasionally- 1 beer or margarita 1-2 times per year   Drug use: No   Sexual activity: Not on file  Other Topics Concern   Not on file  Social History Narrative   Not on file   Social Drivers of Health   Financial Resource Strain: Low Risk  (04/20/2023)   Overall Financial Resource Strain (CARDIA)    Difficulty of Paying Living Expenses: Not hard at all  Food Insecurity: No Food Insecurity (04/20/2023)   Hunger Vital Sign    Worried About Running Out of Food in the Last Year: Never true    Ran Out of Food in the Last Year: Never true  Transportation Needs: No Transportation Needs (04/20/2023)   PRAPARE - Administrator, Civil Service (Medical): No    Lack of Transportation (Non-Medical): No  Physical Activity: Sufficiently Active (04/20/2023)   Exercise Vital Sign    Days of Exercise per Week: 3 days    Minutes of Exercise per Session: 60 min  Stress: No Stress Concern Present (04/20/2023)   [employer] of Occupational Health - Occupational Stress Questionnaire    Feeling of  Stress : Not at all  Social Connections: Moderately Integrated (04/20/2023)   Social Connection and Isolation Panel [NHANES]    Frequency of Communication with Friends and Family: More than three times a week    Frequency of Social Gatherings with Friends and Family: More than three times a week    Attends Religious Services: More than 4 times per year    Active Member of [employer] or Organizations: No    Attends Engineer, structural: Never    Marital Status: Married    Tobacco Counseling Counseling given: Not Answered Tobacco comments: has been quit for 30 years   Clinical Intake:  Pre-visit preparation completed: Yes  Pain : No/denies pain     BMI - recorded: 25.22 Nutritional Status: BMI 25 -29 Overweight Nutritional Risks: None Diabetes: Yes CBG done?: No (FBS 159 per patient) Did pt. bring in CBG monitor from home?: No  How often do you need to have someone help you when you read instructions, pamphlets, or other written materials from your doctor or pharmacy?: 1 - Never  Interpreter Needed?: No  Information entered by :: Tora Kindred, CMA   Activities of Daily Living    04/20/2023    1:50 PM 07/06/2022    8:44 AM  In your present state of health, do you have any difficulty performing the following activities:  Hearing? 1 1  Comment wears hearing aids   Vision? 0 0  Difficulty concentrating or making decisions? 0 0  Walking or climbing stairs? 0 0  Dressing or bathing? 0 0  Doing errands, shopping? 0 0  Preparing Food and eating ? N   Using the Toilet? N   In the past six months, have you accidently leaked urine? N   Do you have problems with loss of bowel control? N   Managing your Medications? N   Managing your Finances? N   Housekeeping or managing your Housekeeping? N     Patient Care Team: Ronnald Ramp, MD as PCP - General (Family Medicine) Lockie Mola, MD as Referring Physician (Ophthalmology) Sharrell Ku, MD as  Consulting Physician (Gastroenterology) Alwyn Pea, MD as Consulting Physician (Cardiology) Ronney Asters, Jackelyn Poling, RPH-CPP as Pharmacist  Indicate any recent Medical Services you may have received from other than [facility] providers  in the past year (date may be approximate).     Assessment:   This is a routine wellness examination for [place].  Hearing/Vision screen Hearing Screening - Comments:: Wears hearing aids Vision Screening - Comments:: Gets eye exams, Dr Inez Pilgrim [facility]   Goals Addressed               This Visit's Progress     COMPLETED: DIET - EAT MORE FRUITS AND VEGETABLES        COMPLETED: DIET - INCREASE WATER INTAKE        Recommend increasing water intake to 4 glasses a day.       COMPLETED: Exercise 3x per week (30 min per time)        Recommend to exercise for 3 days a week for at least 30 minutes at a time.        Patient Stated (pt-stated)        Maintain current health and activity      Depression Screen    04/20/2023    2:03 PM 10/07/2022    8:50 AM 07/06/2022    8:44 AM 06/21/2022   10:03 AM 04/13/2022    1:12 PM 04/05/2022    8:25 AM 12/29/2021    2:01 PM  PHQ 2/9 Scores  PHQ - 2 Score 0 0 0 0 0 0 0  PHQ- 9 Score   0 0 0 0     Fall Risk    04/20/2023    2:06 PM 10/07/2022    8:50 AM 07/06/2022    8:43 AM 06/21/2022   10:03 AM 04/13/2022    1:08 PM  Fall Risk   Falls in the past year? 0 0 0  0  Number falls in past yr: 0 0 0 0 0  Injury with Fall? 0 0 0 0 0  Risk for fall due to : No Fall Risks No Fall Risks No Fall Risks  No Fall Risks  Follow up Falls prevention discussed;Falls evaluation completed Falls evaluation completed   Falls prevention discussed;Education provided    MEDICARE RISK AT HOME: Medicare Risk at Home Any stairs in or around the home?: Yes If so, are there any without handrails?: Yes Home free of loose throw rugs in walkways, pet beds, electrical cords, etc?: Yes Adequate lighting in your home to reduce risk of  falls?: Yes Life alert?: No Use of a cane, walker or w/c?: No Grab bars in the bathroom?: No Shower chair or bench in shower?: No Elevated toilet seat or a handicapped toilet?: Yes  TIMED UP AND GO:  Was the test performed?  No    Cognitive Function:        04/20/2023    2:07 PM 04/13/2022    1:20 PM 03/22/2018    9:42 AM 03/04/2017   10:27 AM  6CIT Screen  What Year? 0 points 0 points 0 points 0 points  What month? 0 points 0 points 0 points 0 points  What time? 0 points 0 points 0 points 0 points  Count back from 20 0 points 0 points 0 points 0 points  Months in reverse 0 points 0 points 0 points 0 points  Repeat phrase 0 points 0 points 0 points 2 points  Total Score 0 points 0 points 0 points 2 points    Immunizations Immunization History  Administered Date(s) Administered   Fluad Quad(high Dose 65+) 12/04/2020   Influenza, High Dose Seasonal PF 12/01/2015, 12/03/2016, 12/06/2017, 12/12/2019   Influenza-Unspecified 11/30/2014,  12/12/2018, 12/02/2021   PFIZER Comirnaty(Gray Top)Covid-19 Tri-Sucrose Vaccine 07/23/2020   PFIZER(Purple Top)SARS-COV-2 Vaccination 03/07/2019, 03/28/2019, 01/17/2020   Pneumococcal Conjugate-13 12/07/2012, 03/14/2014   Pneumococcal Polysaccharide-23 02/03/2005, 12/13/2008   Tdap 12/06/2007, 08/30/2018   Zoster Recombinant(Shingrix) 12/03/2016, 04/14/2017   Zoster, Live 12/31/2010    TDAP status: Up to date  Flu Vaccine status: Up to date  Pneumococcal vaccine status: Up to date  Covid-19 vaccine status: Information provided on how to obtain vaccines.   Qualifies for Shingles Vaccine? Yes   Zostavax completed Yes   Shingrix Completed?: Yes  Screening Tests Health Maintenance  Topic Date Due   COVID-19 Vaccine (5 - 2024-25 season) 10/31/2022   OPHTHALMOLOGY EXAM  11/17/2022   INFLUENZA VACCINE  05/30/2023 (Originally 09/30/2022)   HEMOGLOBIN A1C  10/09/2023   Diabetic kidney evaluation - eGFR measurement  04/10/2024   Diabetic  kidney evaluation - Urine ACR  04/10/2024   FOOT EXAM  04/10/2024   Medicare Annual Wellness (AWV)  04/19/2024   Colonoscopy  06/28/2027   DTaP/Tdap/Td (3 - Td or Tdap) 08/29/2028   Pneumonia Vaccine 25+ Years old  Completed   Zoster Vaccines- Shingrix  Completed   HPV VACCINES  Aged Out   Hepatitis C Screening  Discontinued    Health Maintenance  Health Maintenance Due  Topic Date Due   COVID-19 Vaccine (5 - 2024-25 season) 10/31/2022   OPHTHALMOLOGY EXAM  11/17/2022    Colorectal cancer screening: No longer required.   Lung Cancer Screening: (Low Dose CT Chest recommended if Age 48-80 years, 20 pack-year currently smoking OR have quit w/in 15years.) does not qualify.   Lung Cancer Screening Referral: n/a  Additional Screening:  Hepatitis C Screening: does not qualify; Completed 03/10/16  Vision Screening: Recommended annual ophthalmology exams for early detection of glaucoma and other disorders of the eye. Is the patient up to date with their annual eye exam?  Yes  Who is the provider or what is the name of the office in which the patient attends annual eye exams? Dr. Inez Pilgrim If pt is not established with a provider, would they like to be referred to a provider to establish care? No .   Dental Screening: Recommended annual dental exams for proper oral hygiene  Diabetic Foot Exam: Diabetic Foot Exam: Completed 04/11/23  Community Resource Referral / Chronic Care Management: CRR required this visit?  No   CCM required this visit?  No     Plan:     I have personally reviewed and noted the following in the patient's chart:   Medical and social history Use of alcohol, tobacco or illicit drugs  Current medications and supplements including opioid prescriptions. Patient is not currently taking opioid prescriptions. Functional ability and status Nutritional status Physical activity Advanced directives List of other physicians Hospitalizations, surgeries, and ER  visits in previous 12 months Vitals Screenings to include cognitive, depression, and falls Referrals and appointments  In addition, I have reviewed and discussed with patient certain preventive protocols, quality metrics, and best practice recommendations. A written personalized care plan for preventive services as well as general preventive health recommendations were provided to patient.     Tora Kindred, CMA   04/20/2023   After Visit Summary: (MyChart) Due to this being a telephonic visit, the after visit summary with patients personalized plan was offered to patient via MyChart   Nurse Notes:  Placed referral to DM & Nutrition education

## 2023-04-20 NOTE — Patient Instructions (Addendum)
Carl Dominguez , Thank you for taking time to come for your Medicare Wellness Visit. I appreciate your ongoing commitment to your health goals. Please review the following plan we discussed and let me know if I can assist you in the future.   Referrals/Orders/Follow-Ups/Clinician Recommendations: I have replaced a referral to diabetes & nutrition education. Someone from their office will give you a call.  This is a list of the screening recommended for you and due dates:  Health Maintenance  Topic Date Due   COVID-19 Vaccine (5 - 2024-25 season) 10/31/2022   Eye exam for diabetics  11/17/2022   Hemoglobin A1C  10/09/2023   Yearly kidney function blood test for diabetes  04/10/2024   Yearly kidney health urinalysis for diabetes  04/10/2024   Complete foot exam   04/10/2024   Medicare Annual Wellness Visit  04/19/2024   Colon Cancer Screening  06/28/2027   DTaP/Tdap/Td vaccine (3 - Td or Tdap) 08/29/2028   Pneumonia Vaccine  Completed   Flu Shot  Completed   Zoster (Shingles) Vaccine  Completed   HPV Vaccine  Aged Out   Hepatitis C Screening  Discontinued    Advanced directives: (In Chart) A copy of your advanced directives are scanned into your chart should your provider ever need it.  Next Medicare Annual Wellness Visit scheduled for next year: Yes, 04/25/24 @ 1:50pm (phone visit)

## 2023-04-21 NOTE — Telephone Encounter (Signed)
Refills for this were sent electronically on 04/19/23

## 2023-04-25 DIAGNOSIS — E875 Hyperkalemia: Secondary | ICD-10-CM | POA: Diagnosis not present

## 2023-04-26 LAB — BMP8+EGFR
BUN/Creatinine Ratio: 15 (ref 10–24)
BUN: 17 mg/dL (ref 8–27)
CO2: 22 mmol/L (ref 20–29)
Calcium: 9.7 mg/dL (ref 8.6–10.2)
Chloride: 102 mmol/L (ref 96–106)
Creatinine, Ser: 1.17 mg/dL (ref 0.76–1.27)
Glucose: 133 mg/dL — ABNORMAL HIGH (ref 70–99)
Potassium: 5.1 mmol/L (ref 3.5–5.2)
Sodium: 142 mmol/L (ref 134–144)
eGFR: 63 mL/min/{1.73_m2} (ref >59)

## 2023-05-11 ENCOUNTER — Other Ambulatory Visit: Payer: Self-pay | Admitting: Pharmacist

## 2023-05-11 DIAGNOSIS — E1165 Type 2 diabetes mellitus with hyperglycemia: Secondary | ICD-10-CM

## 2023-05-11 DIAGNOSIS — E1159 Type 2 diabetes mellitus with other circulatory complications: Secondary | ICD-10-CM

## 2023-05-11 NOTE — Patient Instructions (Signed)
 Goals Addressed             This Visit's Progress    Pharmacy Goals       The goal A1c is less than 7%. This is the best way to reduce the risk of the long term complications of diabetes, including heart disease, kidney disease, eye disease, strokes, and nerve damage. An A1c of less than 7% corresponds with fasting sugars of 80-130 and 2 hour after meal sugars of 80-180. Please continue to monitor your blood sugar closely and contact the office if needed for any low blood sugar readings or other symptoms.    Estelle Grumbles, PharmD, [facility] [phone]

## 2023-05-11 NOTE — Progress Notes (Signed)
 05/11/2023 Name: Carl Dominguez MRN: [id number] DOB: 10/08/1945  Chief Complaint  Patient presents with   Medication Management    Carl Dominguez is a 78 y.o. year old male who presented for a telephone visit.   They were referred to the pharmacist by their PCP for assistance in managing diabetes.      Subjective:   Care Team: Primary Care Provider: Ronnald Ramp, MD ; Next Scheduled Visit: 08/09/2023 Cardiologist: Mirian Capuchin, MD; Next Scheduled Visit: 05/26/2023 Gastroenterologist: Darryl Lent, MD Dietician: Darrick Grinder, RD; Next Scheduled Visit: 06/13/2023   Medication Access/Adherence  Current Pharmacy:  [facility] - [place], [place] - [address] [place] [address] Phone: [phone] Fax: [phone]  [facility] [phone] [place], [place] - [address] AT [facility] [address] [place] [address] Phone: [phone] Fax: [phone]   Patient reports affordability concerns with their medications: No  Patient reports access/transportation concerns to their pharmacy: No  Patient reports adherence concerns with their medications:  No      Diabetes:   Current medications:  - glipizide 10 mg daily before breakfast - metformin ER 500 mg daily with breakfast - pioglitazone 45 mg daily - Lantus Solostar - 4 units in morning and 4 units in evening at bedtime   Reports uses Dexcom G7 continuous glucose monitor (CGM) with reader Patient reports data from reader from past 14- day report today: Average Glucose: 173 mg/dL Glucose Management Indicator: 7.4% Time in Goal:  - Time in range 70-180: 61% - Time above range: 37% - Time below range: 2%   Reports recently felt dizzy recently with reading in 70s. Reports resolved with orange juice   Current physical activity: reports stays active in the yard throughout the day (mowing, gardening,  trimming) or walks at the mall   Statin therapy: atorvastatin 40 mg daily     Hypertension:   Current medications:  - losartan 50 mg daily - metoprolol ER 25 daily   Patient has an automated, upper arm home BP cuff Denies checking BP at home   Patient denies hypotensive s/sx including dizziness, lightheadedness as long as takes positional changes slowly   Objective:  Lab Results  Component Value Date   HGBA1C 7.4 (H) 04/11/2023    Lab Results  Component Value Date   CREATININE 1.17 04/25/2023   BUN 17 04/25/2023   NA 142 04/25/2023   K 5.1 04/25/2023   CL 102 04/25/2023   CO2 22 04/25/2023    Lab Results  Component Value Date   CHOL 135 10/07/2022   HDL 43 10/07/2022   LDLCALC 64 10/07/2022   TRIG 164 (H) 10/07/2022   CHOLHDL 3.1 10/07/2022   BP Readings from Last 3 Encounters:  04/11/23 135/79  10/07/22 90/60  07/06/22 119/73   Pulse Readings from Last 3 Encounters:  04/11/23 64  10/07/22 (!) 59  07/06/22 60     Medications Reviewed Today     Reviewed by Manuela Neptune, RPH-CPP (Pharmacist) on 05/11/23 at 0859  Med List Status: <None>   Medication Order Taking? Sig Documenting Provider Last Dose Status Informant  acetaminophen (TYLENOL) 500 MG tablet [id number]  Take 1,000 mg by mouth 2 (two) times daily. [provider]  Active   aspirin 81 MG tablet [id number]  Take 81 mg by mouth daily.  [provider]  Active  Med Note Carl Dominguez   Thu Nov 14, 2014  8:06 AM) Received from: [employer]  atorvastatin (LIPITOR) 40 MG tablet [id number]  TAKE 1 TABLET AT BEDTIME Simmons-Robinson, Makiera, MD  Active   Blood Glucose Calibration (TRUE METRIX LEVEL 1) Low SOLN [id number]  1 each by In Vitro route daily. Simmons-Robinson, Makiera, MD  Active   Blood Glucose Monitoring Suppl (FREESTYLE FREEDOM) KIT [id number]  by Does not apply route. [provider]  Active   cetirizine (ZYRTEC) 10 MG tablet  [id number]  TAKE 1 TABLET EVERY DAY Bosie Clos, MD  Active   cholecalciferol (VITAMIN D) 1000 UNITS tablet [id number]  Take 1,000 Units by mouth daily.  [provider]  Active            Med Note Carl Dominguez   Thu Nov 14, 2014  8:06 AM) Received from: [employer] Glucose Sensor (DEXCOM G7 SENSOR) [place] [id number]  by Does not apply route. [provider]  Active   famotidine (PEPCID) 20 MG tablet [id number]  Take 20 mg by mouth daily.  [provider]  Active            Med Note Carl Dominguez   Thu Nov 14, 2014  8:06 AM) Received from: [facility]  fluticasone [facility]) 50 MCG/ACT nasal spray [id number]  Place into both nostrils daily as needed. [provider]  Active   gabapentin (NEURONTIN) 100 MG capsule [id number]  Take 1 capsule (100 mg total) by mouth 3 (three) times daily. Simmons-Robinson, Makiera, MD  Active   glipiZIDE (GLUCOTROL) 10 MG tablet [id number] Yes TAKE 1 TABLET EVERY DAY Simmons-Robinson, Makiera, MD Taking Active   insulin glargine (LANTUS) 100 UNIT/ML Solostar Pen [id number] Yes Inject 4 Units into the skin 2 (two) times daily. Simmons-Robinson, Makiera, MD Taking Active   Lactobacillus (PROBIOTIC ACIDOPHILUS PO) [id number]  Take 1 tablet by mouth daily. [provider]  Active   latanoprost (XALATAN) 0.005 % ophthalmic solution [id number]  Place 1 drop into both eyes at bedtime.  [provider]  Active            Med Note Carl Dominguez   Thu Nov 14, 2014  8:06 AM) Received from: [employer]  losartan (COZAAR) 50 MG tablet [id number]  TAKE 1 TABLET EVERY DAY Simmons-Robinson, Makiera, MD  Active   metFORMIN (GLUCOPHAGE-XR) 500 MG 24 hr tablet [id number] Yes Take 1 tablet (500 mg total) by mouth daily with breakfast. Simmons-Robinson, Makiera, MD Taking Active   metoprolol succinate (TOPROL-XL) 25 MG 24 hr tablet [id number]  TAKE 1 TABLET EVERY DAY  Simmons-Robinson, Makiera, MD  Active   montelukast (SINGULAIR) 10 MG tablet [id number]  TAKE 1 TABLET AT BEDTIME Simmons-Robinson, Makiera, MD  Active   pioglitazone (ACTOS) 45 MG tablet [id number] Yes TAKE 1 TABLET EVERY DAY Simmons-Robinson, Makiera, MD Taking Active   polyethylene glycol (MIRALAX / GLYCOLAX) 17 g packet [id number]  Take 17 g by mouth daily as needed for mild constipation. [provider]  Active   psyllium (METAMUCIL SMOOTH TEXTURE) 28 % packet [id number]  Take 1 packet by mouth 2 (two) times daily.  [provider]  Active            Med Note Maisie Fus, PAULA   Wed Apr 20, 2023  1:56 PM) Uses once daily  timolol (TIMOPTIC) 0.5 % ophthalmic solution [id number]  1 drop every morning. [provider]  Active   triamcinolone  cream (KENALOG) 0.1 % [id number]  Apply 1 Application topically 2 (two) times daily as needed.  Patient not taking: Reported on 04/20/2023   [provider]  Active               Assessment/Plan:   Diabetes: - Reviewed long term cardiovascular and renal outcomes of uncontrolled blood sugar - Reviewed dietary modifications including: Encourage patient to have regular well-balance meals and snacks while controlling carbohydrate portion sizes Advise against skipping meals. Discuss having a balanced snack at bedtime to help prevent any overnight low blood sugar readings - Counsel patient on s/s of low blood sugar and how to treat lows Again encourage patient to have glucose tablets to carry with him - Recommend to continue to use Dexcom G7 continuous glucose monitor (CGM) to keep track of his blood sugar and use as feedback on dietary choices. Patient to continue to use fingerstick checks as needed as back up to CGM. Patient to contact office if needed for any symptoms or if has any episodes of hypoglycemia   Hypertension: - Currently controlled - Advise patient to take positional changes slowly - Encourage patient to  keep a record when checks home blood pressure and heart rate and bring this record with him to medical appointments     Follow Up Plan:   Patient denies further medication questions or concerns today Provide patient with contact information for clinic pharmacist to contact if needed in future for medication questions/concerns    Estelle Grumbles, PharmD, [facility] [phone]

## 2023-05-24 LAB — HM DIABETES EYE EXAM

## 2023-06-01 ENCOUNTER — Other Ambulatory Visit: Payer: Self-pay | Admitting: Family Medicine

## 2023-06-01 DIAGNOSIS — E119 Type 2 diabetes mellitus without complications: Secondary | ICD-10-CM

## 2023-06-01 DIAGNOSIS — I10 Essential (primary) hypertension: Secondary | ICD-10-CM

## 2023-06-13 ENCOUNTER — Ambulatory Visit: Payer: Medicare HMO | Admitting: Dietician

## 2023-06-21 ENCOUNTER — Other Ambulatory Visit: Payer: Self-pay | Admitting: Family Medicine

## 2023-06-21 DIAGNOSIS — E119 Type 2 diabetes mellitus without complications: Secondary | ICD-10-CM

## 2023-06-21 NOTE — Telephone Encounter (Signed)
 Copied from CRM [id number]. Topic: Clinical - Medication Refill >> Jun 21, 2023  1:09 PM Baldomero Bone wrote: Most Recent Primary Care Visit:  Provider: Jaunita Messier  Department: [facility]  Visit Type: MEDICARE AWV, SEQUENTIAL  Date: 04/20/2023  Medication: BD NanoTM 2nd Gen Pen Needles  Has the patient contacted their pharmacy? Yes (Agent: If no, request that the patient contact the pharmacy for the refill. If patient does not wish to contact the pharmacy document the reason why and proceed with request.) (Agent: If yes, when and what did the pharmacy advise?)Pharmacy will call in prescription request  Is this the correct pharmacy for this prescription? Yes If no, delete pharmacy and type the correct one.  This is the patient's preferred pharmacy:  [id number] - [place], [place] - [address] AT [facility] [address] [place] [address] Phone: [phone] Fax: [phone]    Has the prescription been filled recently? No  Is the patient out of the medication? No  Has the patient been seen for an appointment in the last year OR does the patient have an upcoming appointment? Yes  Can we respond through MyChart? No  Agent: Please be advised that Rx refills may take up to 3 business days. We ask that you follow-up with your pharmacy.

## 2023-06-22 MED ORDER — BD PEN NEEDLE NANO 2ND GEN 32G X 4 MM MISC: 2 refills | Status: DC

## 2023-06-22 NOTE — Telephone Encounter (Signed)
 Requested medication (s) are due for refill today - no  Requested medication (s) are on the active medication list -no  Future visit scheduled -yes  Last refill: unknown  Notes to clinic: Patient has Lantus  on current medication list- no current pen needle Rx- sent for review of request  Requested Prescriptions  Pending Prescriptions Disp Refills   Insulin  Pen Needle (BD PEN NEEDLE NANO 2ND GEN) 32G X 4 MM MISC 200 each 2    Sig: USE WITH LANTUS  INSULIN  FOR 2 INJECTIONS DAILY. REPLACE NEEDLE FOR [facility] INJECTION     Endocrinology: Diabetes - Testing Supplies Passed - 06/22/2023 11:23 AM      Passed - Valid encounter within last 12 months    Recent Outpatient Visits           2 months ago Hypertension associated with diabetes (HCC)   [facility] Simmons-Robinson, Judyann Number, MD       Future Appointments             In 1 month Simmons-Robinson, Judyann Number, MD [facility], [facility]               Requested Prescriptions  Pending Prescriptions Disp Refills   Insulin  Pen Needle (BD PEN NEEDLE NANO 2ND GEN) 32G X 4 MM MISC 200 each 2    Sig: USE WITH LANTUS  INSULIN  FOR 2 INJECTIONS DAILY. REPLACE NEEDLE FOR [facility] INJECTION     Endocrinology: Diabetes - Testing Supplies Passed - 06/22/2023 11:23 AM      Passed - Valid encounter within last 12 months    Recent Outpatient Visits           2 months ago Hypertension associated with diabetes (HCC)   [facility] Simmons-Robinson, Judyann Number, MD       Future Appointments             In 1 month Simmons-Robinson, Judyann Number, MD [facility]

## 2023-06-25 ENCOUNTER — Other Ambulatory Visit: Payer: Self-pay | Admitting: Family Medicine

## 2023-06-25 DIAGNOSIS — E119 Type 2 diabetes mellitus without complications: Secondary | ICD-10-CM

## 2023-06-27 NOTE — Telephone Encounter (Signed)
 Rx 06/22/23 #200 2RF- duplicate request Requested Prescriptions  Pending Prescriptions Disp Refills   BD PEN NEEDLE NANO 2ND GEN 32G X 4 MM MISC [Pharmacy Med Name: B-D NANO 2ND GEN PEN NDL [id number]] 200 each 2    Sig: USE WITH LANTUS  INSULIN  FOR 2 INJECTIONS DAILY. REPLACE NEEDLE FOR EACH INJECTION.     There is no refill protocol information for this order

## 2023-07-18 ENCOUNTER — Telehealth: Payer: Self-pay

## 2023-07-18 NOTE — Telephone Encounter (Signed)
 Copied from CRM [id number]. Topic: Clinical - Order For Equipment >> Jul 15, 2023  5:07 PM Star [place] wrote: Reason for CRM: Tammy with [employer]- [phone]- still need the doctors signed order for supplies

## 2023-07-21 NOTE — Telephone Encounter (Signed)
 Spoke with Jaunita Messier with [employer] and reports at this point nothing is needed.

## 2023-07-31 ENCOUNTER — Other Ambulatory Visit: Payer: Self-pay | Admitting: Family Medicine

## 2023-07-31 DIAGNOSIS — J301 Allergic rhinitis due to pollen: Secondary | ICD-10-CM

## 2023-08-05 ENCOUNTER — Encounter: Payer: Self-pay | Admitting: Emergency Medicine

## 2023-08-05 ENCOUNTER — Ambulatory Visit
Admission: EM | Admit: 2023-08-05 | Discharge: 2023-08-05 | Disposition: A | Discharge status: Home / Self Care | Attending: Family Medicine | Admitting: Family Medicine

## 2023-08-05 ENCOUNTER — Ambulatory Visit: Payer: Self-pay

## 2023-08-05 ENCOUNTER — Ambulatory Visit (INDEPENDENT_AMBULATORY_CARE_PROVIDER_SITE_OTHER)

## 2023-08-05 DIAGNOSIS — S4992XA Unspecified injury of left shoulder and upper arm, initial encounter: Secondary | ICD-10-CM | POA: Diagnosis not present

## 2023-08-05 DIAGNOSIS — M25512 Pain in left shoulder: Secondary | ICD-10-CM | POA: Diagnosis not present

## 2023-08-05 NOTE — ED Provider Notes (Signed)
 [facility]    CSN: [id number] Arrival date & time: 08/05/23  1053      History   Chief Complaint Chief Complaint  Patient presents with   Fall   Shoulder Injury    HPI Carl Dominguez is a 78 y.o. male presents for shoulder pain.  Patient reports this morning he excellently rolled out of bed landing onto his left shoulder.  He denies head injury or LOC.  Reports sensibilities been having lateral left shoulder pain that occurs primarily with movement.  Denies any bruising, swelling, numbness or tingling.  Denies any neck or back pain.  He is not on blood thinning medications.  Denies any history of injuries or surgeries to the shoulder in the past.  He has taken 2 Tylenol this morning.  No other injuries or concerns at this time.   Fall  Shoulder Injury    Past Medical History:  Diagnosis Date   Colon polyp    Coronary artery disease    Diabetes mellitus without complication (HCC)    Dyspnea    Dysrhythmia    GERD (gastroesophageal reflux disease)    Heart disease    Hemorrhoids    Hyperlipidemia    Hypertension    Myocardial infarction [facility] 1999    Patient Active Problem List   Diagnosis Date Noted   Arthritis of left hand 04/11/2023   Heart disease 04/09/2021   Diabetes mellitus due to underlying condition with chronic kidney disease, with long-term current use of insulin  (HCC) 03/10/2021   Hypertension associated with diabetes (HCC) 03/10/2021   BPH (benign prostatic hyperplasia) 03/19/2020   Diverticulosis 03/19/2020   History of adenomatous polyp of colon 03/19/2020   Osteoarthritis 03/19/2020   CAD S/P percutaneous coronary angioplasty 03/19/2020   Elbow pain 09/26/2019   Fall 09/26/2019   Hip pain 09/26/2019   Actinic keratosis 11/14/2014   CAD in native artery 11/14/2014   Cognitive complaints 11/14/2014   Gastro-esophageal reflux disease without esophagitis 11/14/2014   Hemorrhage of gastrointestinal tract 11/14/2014   Internal  hemorrhoids 11/14/2014   Personal history of tobacco use, presenting hazards to health 11/14/2014   Hyperlipidemia 11/14/2014   Allergy 11/14/2014   Arthritis of hand, degenerative 11/14/2014   Allergic rhinitis, seasonal 11/14/2014   Prostatism 11/14/2014   Hypercholesterolemia without hypertriglyceridemia 11/14/2014   Change in blood platelet count 11/14/2014   Diabetes mellitus (HCC) 11/14/2014   Avitaminosis D 11/14/2014    Past Surgical History:  Procedure Laterality Date   CATARACT EXTRACTION W/PHACO Right 03/24/2016   Procedure: CATARACT EXTRACTION PHACO AND INTRAOCULAR LENS PLACEMENT (IOC)  Right;  Surgeon: Annell Kidney, MD;  Location: [facility];  Service: Ophthalmology;  Laterality: Right;  Diabetic - oral meds right eye   CATARACT EXTRACTION W/PHACO Left 10/12/2021   Procedure: CATARACT EXTRACTION PHACO AND INTRAOCULAR LENS PLACEMENT (IOC) LEFT 7.86 01:08.4;  Surgeon: Annell Kidney, MD;  Location: [facility];  Service: Ophthalmology;  Laterality: Left;   CHOLECYSTECTOMY  03/01/2008   COLONOSCOPY     COLONOSCOPY WITH PROPOFOL  N/A 06/28/2022   Procedure: COLONOSCOPY WITH PROPOFOL ;  Surgeon: Shane Darling, MD;  Location: [facility];  Service: Endoscopy;  Laterality: N/A;   CORONARY ANGIOPLASTY WITH STENT PLACEMENT  03/01/1997   ESOPHAGOGASTRODUODENOSCOPY (EGD) WITH PROPOFOL  N/A 06/28/2022   Procedure: ESOPHAGOGASTRODUODENOSCOPY (EGD) WITH PROPOFOL ;  Surgeon: Shane Darling, MD;  Location: [facility];  Service: Endoscopy;  Laterality: N/A;   EYE SURGERY     HERNIA REPAIR  Home Medications    Prior to Admission medications   Medication Sig Start Date End Date Taking? Authorizing Provider  acetaminophen (TYLENOL) 500 MG tablet Take 1,000 mg by mouth 2 (two) times daily.    [provider]  aspirin 81 MG tablet Take 81 mg by mouth daily.  04/06/10   [provider]  atorvastatin  (LIPITOR) 40 MG tablet  TAKE 1 TABLET AT BEDTIME 06/01/23   Simmons-Robinson, Makiera, MD  Blood Glucose Calibration (TRUE METRIX LEVEL 1) Low SOLN 1 each by In Vitro route daily. 04/19/23   Simmons-Robinson, Makiera, MD  Blood Glucose Monitoring Suppl (FREESTYLE FREEDOM) KIT by Does not apply route.    [provider]  cetirizine  (ZYRTEC ) 10 MG tablet TAKE 1 TABLET EVERY DAY 08/24/21   Nikki Barters, MD  cholecalciferol (VITAMIN D) 1000 UNITS tablet Take 1,000 Units by mouth daily.     [provider]  Continuous Glucose Sensor (DEXCOM G7 SENSOR) MISC by Does not apply route.    [provider]  famotidine (PEPCID) 20 MG tablet Take 20 mg by mouth daily.     [provider]  fluticasone  (FLONASE ) 50 MCG/ACT nasal spray Place into both nostrils daily as needed.    [provider]  gabapentin  (NEURONTIN ) 100 MG capsule Take 1 capsule (100 mg total) by mouth 3 (three) times daily. 04/11/23   Simmons-Robinson, Judyann Number, MD  glipiZIDE  (GLUCOTROL ) 10 MG tablet TAKE 1 TABLET EVERY DAY 06/01/23   Simmons-Robinson, Judyann Number, MD  insulin  glargine (LANTUS ) 100 UNIT/ML Solostar Pen Inject 4 Units into the skin 2 (two) times daily. 08/09/22   Simmons-Robinson, Makiera, MD  Insulin  Pen Needle (BD PEN NEEDLE NANO 2ND GEN) 32G X 4 MM MISC USE WITH LANTUS  INSULIN  FOR 2 INJECTIONS DAILY. REPLACE NEEDLE FOR [facility] INJECTION 06/22/23   Simmons-Robinson, Judyann Number, MD  Lactobacillus (PROBIOTIC ACIDOPHILUS PO) Take 1 tablet by mouth daily.    [provider]  latanoprost (XALATAN) 0.005 % ophthalmic solution Place 1 drop into both eyes at bedtime.     [provider]  losartan  (COZAAR ) 50 MG tablet TAKE 1 TABLET EVERY DAY 03/07/23   Simmons-Robinson, Judyann Number, MD  metFORMIN  (GLUCOPHAGE -XR) 500 MG 24 hr tablet Take 1 tablet (500 mg total) by mouth daily with breakfast. 07/04/23   Simmons-Robinson, Makiera, MD  metoprolol  succinate (TOPROL -XL) 25 MG 24 hr tablet TAKE 1 TABLET EVERY DAY 06/01/23    Simmons-Robinson, Makiera, MD  montelukast  (SINGULAIR ) 10 MG tablet TAKE 1 TABLET AT BEDTIME 08/01/23   Simmons-Robinson, Makiera, MD  pioglitazone  (ACTOS ) 45 MG tablet TAKE 1 TABLET EVERY DAY 03/07/23   Simmons-Robinson, Makiera, MD  polyethylene glycol (MIRALAX / GLYCOLAX) 17 g packet Take 17 g by mouth daily as needed for mild constipation.    [provider]  psyllium (METAMUCIL SMOOTH TEXTURE) 28 % packet Take 1 packet by mouth 2 (two) times daily.  11/06/13   [provider]  timolol  (TIMOPTIC ) 0.5 % ophthalmic solution 1 drop every morning. 11/06/20   [provider]  triamcinolone cream (KENALOG) 0.1 % Apply 1 Application topically 2 (two) times daily as needed. Patient not taking: Reported on 04/20/2023    [provider]    Family History Family History  Problem Relation Age of Onset   Congestive Heart Failure Mother    Suicidality Father 62    Social History Social History   Tobacco Use   Smoking status: Former    Current packs/day: 0.00    Average packs/day: 1 pack/day for  18.0 years (18.0 ttl pk-yrs)    Types: Cigarettes    Start date: 5    Quit date: 03/01/1982    Years since quitting: 41.4   Smokeless tobacco: Never   Tobacco comments:    has been quit for 30 years  Vaping Use   Vaping status: Never Used  Substance Use Topics   Alcohol  use: Yes    Comment: occasionally- 1 beer or margarita 1-2 times per year   Drug use: No     Allergies   Patient has no known allergies.   Review of Systems Review of Systems  Musculoskeletal:        Left shoulder pain      Physical Exam Triage Vital Signs ED Triage Vitals  Encounter Vitals Group     BP 08/05/23 1106 (!) 150/73     Systolic BP Percentile --      Diastolic BP Percentile --      Pulse Rate 08/05/23 1106 62     Resp 08/05/23 1106 15     Temp 08/05/23 1106 97.7 F (36.5 C)     Temp Source 08/05/23 1106 Oral     SpO2 08/05/23 1106 95 %     Weight 08/05/23 1104 160 lb  15 oz (73 kg)     Height 08/05/23 1104 5\' 7"  (1.702 m)     Head Circumference --      Peak Flow --      Pain Score 08/05/23 1104 7     Pain Loc --      Pain Education --      Exclude from Growth Chart --    No data found.  Updated Vital Signs BP (!) 150/73 (BP Location: Right Arm)   Pulse 62   Temp 97.7 F (36.5 C) (Oral)   Resp 15   Ht 5\' 7"  (1.702 m)   Wt 160 lb 15 oz (73 kg)   SpO2 95%   BMI 25.21 kg/m   Visual Acuity Right Eye Distance:   Left Eye Distance:   Bilateral Distance:    Right Eye Near:   Left Eye Near:    Bilateral Near:     Physical Exam Vitals and nursing note reviewed.  Constitutional:      General: He is not in acute distress.    Appearance: Normal appearance. He is not ill-appearing.  HENT:     Head: Normocephalic and atraumatic.  Eyes:     Pupils: Pupils are equal, round, and reactive to light.  Cardiovascular:     Rate and Rhythm: Normal rate.  Pulmonary:     Effort: Pulmonary effort is normal.  Musculoskeletal:     Left shoulder: Tenderness present. No swelling, deformity, effusion, laceration, bony tenderness or crepitus. Normal range of motion. Normal strength. Normal pulse.     Cervical back: Normal range of motion and neck supple. No rigidity. No spinous process tenderness or muscular tenderness.     Thoracic back: Normal.     Comments: FROM of the shoulder with pain on lateral and frontal raise to 90 degrees, negative kennedy hawkins test and neers test. Equal strength upper extremities bilaterally   Skin:    General: Skin is warm and dry.  Neurological:     General: No focal deficit present.     Mental Status: He is alert and oriented to person, place, and time.  Psychiatric:        Mood and Affect: Mood normal.        Behavior:  Behavior normal.      UC Treatments / Results  Labs (all labs ordered are listed, but only abnormal results are displayed) Labs Reviewed - No data to display  EKG   Radiology DG Shoulder  Left Result Date: 08/05/2023 CLINICAL DATA:  Marvell Slider out of bed today EXAM: LEFT SHOULDER - 2+ VIEW COMPARISON:  None Available. FINDINGS: There is no evidence of fracture or dislocation. There is no evidence of arthropathy or other focal bone abnormality. Soft tissues are unremarkable. IMPRESSION: Negative. Electronically Signed   By: Fredrich Jefferson M.D.   On: 08/05/2023 11:44    Procedures Procedures (including critical care time)  Medications Ordered in UC Medications - No data to display  Initial Impression / Assessment and Plan / UC Course  I have reviewed the triage vital signs and the nursing notes.  Pertinent labs & imaging results that were available during my care of the patient were reviewed by me and considered in my medical decision making (see chart for details).     Reviewed exam and symptoms with patient.  No red flags.  Shoulder x-ray negative.  Discussed soft tissue injury of left shoulder.  Reviewed RICE therapy and he may continue over-the-counter analgesics as needed.  PCP follow-up if symptoms do not improve.  ER precautions reviewed. Final Clinical Impressions(s) / UC Diagnoses   Final diagnoses:  Acute pain of left shoulder  Soft tissue injury of left shoulder, initial encounter     Discharge Instructions      Your x-ray was negative for fracture.  You may continue over-the-counter Tylenol, rest, and ice as needed.  Please follow-up with your PCP if your symptoms do not improve.  Please go to the ER for any worsening symptoms.  Hope you feel better soon!   ED Prescriptions   None    PDMP not reviewed this encounter.   Alleen Arbour, NP 08/05/23 1153

## 2023-08-05 NOTE — ED Triage Notes (Signed)
 Patient states that he was lying in bed and rolled off the bed and landed on the floor this morning.  Patient c/o left shoulder pain.  Patient reports increase pain in his left shoulder when he tries to raise his left arm.  Patient denies LOC.

## 2023-08-05 NOTE — Telephone Encounter (Signed)
 Agree with evaluation, would recommend orthopedic urgent care

## 2023-08-05 NOTE — Discharge Instructions (Signed)
 Your x-ray was negative for fracture.  You may continue over-the-counter Tylenol, rest, and ice as needed.  Please follow-up with your PCP if your symptoms do not improve.  Please go to the ER for any worsening symptoms.  Hope you feel better soon!

## 2023-08-05 NOTE — Telephone Encounter (Signed)
Pt currently at UC.

## 2023-08-05 NOTE — Telephone Encounter (Signed)
 FYI Only or Action Required?: FYI only for provider  Patient was last seen in primary care on 04/11/2023 by Mimi Alt, MD. Called Nurse Triage reporting Shoulder Injury. Symptoms began 6 AM today 08/05/2023 (3 hours prior to speaking with this Triage RN). Interventions attempted: Rest, hydration, or home remedies and Ice/heat application. Symptoms are: gradually worsening.  Triage Disposition: See Physician Within 24 Hours  Patient/caregiver understands and will follow disposition?: Yes---Patient going to Urgent Care due to no availability in PCP Office                Copied from CRM [id number]. Topic: Clinical - Red Word Triage >> Aug 05, 2023  8:48 AM Dorthula Gavel H wrote: Red Word that prompted transfer to Nurse Triage: Pt fell out of bed this morning, on his left shoulder and states it is very painful and he can't raise his arm without pain. Reason for Disposition  [1] MODERATE pain (e.g., interferes with normal activities) AND [2] high-risk adult (e.g., age > 60 years, osteoporosis, chronic steroid use)  Answer Assessment - Initial Assessment Questions 1. MECHANISM: "How did the injury happen?"     Accidentally rolled off the bed while sleeping and landed on left shoulder around 6am today 08/05/2023 2. ONSET: "When did the injury happen?" (Minutes or hours ago)      6am today   (approximately 3 hours ago) 3. APPEARANCE of INJURY: "What does the injury look like?"      Patient doesn't think there are any visible abnormalities 4. SEVERITY: "Can you move the shoulder normally?"      Normally he can 5. SIZE: For cuts, bruises, or swelling, ask: "How large is it?" (e.g., inches or centimeters;  entire joint)      Patient doesn't think there are any visible abnormalities 6. PAIN: "Is there pain?" If Yes, ask: "How bad is the pain?"   (e.g., Scale 1-10; or mild, moderate, severe)   - MILD (1-3): doesn't interfere with normal activities   - MODERATE (4-7): interferes with normal  activities (e.g., work or school) or awakens from sleep   - SEVERE (8-10): excruciating pain, unable to do any normal activities, unable to move arm at all due to pain     "Not pain unless I try to raise my arm" 7. TETANUS: For any breaks in the skin, ask: "When was the last tetanus booster?"     N/A 8. OTHER SYMPTOMS: "Do you have any other symptoms?" (e.g., loss of sensation)     N/A   Patient put an ice pack on it this morning.  Protocols used: Shoulder Injury-A-AH

## 2023-08-09 ENCOUNTER — Ambulatory Visit: Payer: Medicare HMO | Admitting: Family Medicine

## 2023-08-12 ENCOUNTER — Encounter: Payer: Self-pay | Admitting: Family Medicine

## 2023-08-12 ENCOUNTER — Ambulatory Visit (INDEPENDENT_AMBULATORY_CARE_PROVIDER_SITE_OTHER): Admitting: Family Medicine

## 2023-08-12 ENCOUNTER — Ambulatory Visit: Admitting: Dietician

## 2023-08-12 VITALS — BP 88/51 | HR 64 | Ht 67.0 in | Wt 156.0 lb

## 2023-08-12 DIAGNOSIS — E1159 Type 2 diabetes mellitus with other circulatory complications: Secondary | ICD-10-CM

## 2023-08-12 DIAGNOSIS — Z9861 Coronary angioplasty status: Secondary | ICD-10-CM

## 2023-08-12 DIAGNOSIS — I152 Hypertension secondary to endocrine disorders: Secondary | ICD-10-CM | POA: Diagnosis not present

## 2023-08-12 DIAGNOSIS — I2510 Atherosclerotic heart disease of native coronary artery without angina pectoris: Secondary | ICD-10-CM

## 2023-08-12 DIAGNOSIS — E7800 Pure hypercholesterolemia, unspecified: Secondary | ICD-10-CM

## 2023-08-12 DIAGNOSIS — E559 Vitamin D deficiency, unspecified: Secondary | ICD-10-CM

## 2023-08-12 DIAGNOSIS — Z794 Long term (current) use of insulin: Secondary | ICD-10-CM

## 2023-08-12 DIAGNOSIS — K5790 Diverticulosis of intestine, part unspecified, without perforation or abscess without bleeding: Secondary | ICD-10-CM | POA: Diagnosis not present

## 2023-08-12 DIAGNOSIS — E119 Type 2 diabetes mellitus without complications: Secondary | ICD-10-CM

## 2023-08-12 DIAGNOSIS — K219 Gastro-esophageal reflux disease without esophagitis: Secondary | ICD-10-CM | POA: Diagnosis not present

## 2023-08-12 DIAGNOSIS — E1165 Type 2 diabetes mellitus with hyperglycemia: Secondary | ICD-10-CM

## 2023-08-12 MED ORDER — BD PEN NEEDLE NANO 2ND GEN 32G X 4 MM MISC: 11 refills | Status: DC

## 2023-08-12 NOTE — Assessment & Plan Note (Signed)
 Well controlled with losartan  50 mg daily and metoprolol  25 mg daily. Blood pressure today is 88/51 mmHg. Repeat 98/68, pt is fasting Chronic, well controlled  - Continue current antihypertensive medications

## 2023-08-12 NOTE — Progress Notes (Signed)
 Established patient visit   Patient: Carl Dominguez   DOB: Jan 05, 1946   78 y.o. Male  MRN: [id number] Visit Date: 08/12/2023  Today's healthcare provider: Mimi Alt, MD   Chief Complaint  Patient presents with   Hypertension   Subjective     Discussed the use of AI scribe software for clinical note transcription with the patient, who gave verbal consent to proceed.  History of Present Illness   Carl Dominguez is an 78 year old male with type two diabetes and hypertension who presents for medication management and follow-up.  He has experienced issues with his blood sugar levels, which were consistently above 300 mg/dL while taking gabapentin  300mg  twice daily. After reducing the gabapentin  dose to 100 mg at bedtime, his blood sugar levels have improved significantly. He is currently using Lantus  insulin , administering four units twice daily. He has 23 needles left and is concerned about running out due to a backorder issue. He uses BD Nano pen needles and has 56 days' worth of Lantus  left, requiring a new prescription in 56 days. He has experienced issues with his pharmacy not refilling his prescriptions on time. His blood sugar readings have been variable, with a fasting level of 134 mg/dL and a current level of 157 mg/dL without eating. He uses a Dexcom for glucose monitoring but finds it unreliable, as it sometimes reports low blood sugar levels that do not match his symptoms or fingerstick readings.  He experienced a fall last Friday morning and woke up on the floor. Since then, he has had shoulder pain, particularly when moving it in certain ways, and numbness in his hand, which persists despite gabapentin  use. The pain has improved, but certain movements still cause discomfort.  He is currently taking metoprolol  25 mg once daily and losartan  50 mg for blood pressure management. His blood pressure was recorded as 88/51 mmHg, but he does not recall experiencing  symptoms of lightheadedness or dizziness. At home, his blood pressure has been around 138/unknown.  He takes vitamin D3 supplements for low vitamin D levels. He has a history of hypertension, type two diabetes, coronary artery disease, status post coronary angioplasty, seasonal allergic rhinitis, GERD, diverticulosis, arthritis, BPH, hyperlipidemia, and low vitamin D.        Past Medical History:  Diagnosis Date   Colon polyp    Coronary artery disease    Diabetes mellitus without complication (HCC)    Dyspnea    Dysrhythmia    GERD (gastroesophageal reflux disease)    Heart disease    Hemorrhoids    Hyperlipidemia    Hypertension    Myocardial infarction (HCC) 1999    Medications: Outpatient Medications Prior to Visit  Medication Sig   acetaminophen (TYLENOL) 500 MG tablet Take 1,000 mg by mouth 2 (two) times daily.   aspirin 81 MG tablet Take 81 mg by mouth daily.    atorvastatin  (LIPITOR) 40 MG tablet TAKE 1 TABLET AT BEDTIME   Blood Glucose Calibration (TRUE METRIX LEVEL 1) Low SOLN 1 each by In Vitro route daily.   Blood Glucose Monitoring Suppl (FREESTYLE FREEDOM) KIT by Does not apply route.   cetirizine  (ZYRTEC ) 10 MG tablet TAKE 1 TABLET EVERY DAY   cholecalciferol (VITAMIN D) 1000 UNITS tablet Take 1,000 Units by mouth daily.    Continuous Glucose Sensor (DEXCOM G7 SENSOR) MISC by Does not apply route.   famotidine (PEPCID) 20 MG tablet Take 20 mg by mouth daily.    fluticasone  (  FLONASE ) 50 MCG/ACT nasal spray Place into both nostrils daily as needed.   gabapentin  (NEURONTIN ) 100 MG capsule Take 1 capsule (100 mg total) by mouth 3 (three) times daily.   glipiZIDE  (GLUCOTROL ) 10 MG tablet TAKE 1 TABLET EVERY DAY   insulin  glargine (LANTUS ) 100 UNIT/ML Solostar Pen Inject 4 Units into the skin 2 (two) times daily.   Lactobacillus (PROBIOTIC ACIDOPHILUS PO) Take 1 tablet by mouth daily.   latanoprost (XALATAN) 0.005 % ophthalmic solution Place 1 drop into both eyes at  bedtime.    losartan  (COZAAR ) 50 MG tablet TAKE 1 TABLET EVERY DAY   metFORMIN  (GLUCOPHAGE -XR) 500 MG 24 hr tablet Take 1 tablet (500 mg total) by mouth daily with breakfast.   metoprolol  succinate (TOPROL -XL) 25 MG 24 hr tablet TAKE 1 TABLET EVERY DAY   montelukast  (SINGULAIR ) 10 MG tablet TAKE 1 TABLET AT BEDTIME   pioglitazone  (ACTOS ) 45 MG tablet TAKE 1 TABLET EVERY DAY   polyethylene glycol (MIRALAX / GLYCOLAX) 17 g packet Take 17 g by mouth daily as needed for mild constipation.   psyllium (METAMUCIL SMOOTH TEXTURE) 28 % packet Take 1 packet by mouth 2 (two) times daily.    timolol  (TIMOPTIC ) 0.5 % ophthalmic solution 1 drop every morning.   triamcinolone cream (KENALOG) 0.1 % Apply 1 Application topically 2 (two) times daily as needed. (Patient not taking: Reported on 04/20/2023)   [DISCONTINUED] Insulin  Pen Needle (BD PEN NEEDLE NANO 2ND GEN) 32G X 4 MM MISC USE WITH LANTUS  INSULIN  FOR 2 INJECTIONS DAILY. REPLACE NEEDLE FOR EACH INJECTION   No facility-administered medications prior to visit.    Review of Systems  Last CBC Lab Results  Component Value Date   WBC 8.1 08/11/2021   HGB 13.0 08/11/2021   HCT 38.2 08/11/2021   MCV 95 08/11/2021   MCH 32.3 08/11/2021   RDW 12.7 08/11/2021   PLT 170 08/11/2021   Last metabolic panel Lab Results  Component Value Date   GLUCOSE 133 (H) 04/25/2023   NA 142 04/25/2023   K 5.1 04/25/2023   CL 102 04/25/2023   CO2 22 04/25/2023   BUN 17 04/25/2023   CREATININE 1.17 04/25/2023   EGFR 63 04/25/2023   CALCIUM  9.7 04/25/2023   PROT 6.1 10/25/2022   ALBUMIN 4.2 10/25/2022   LABGLOB 1.9 10/25/2022   AGRATIO 2.3 (H) 08/11/2021   BILITOT 0.8 10/25/2022   ALKPHOS 71 10/25/2022   AST 20 10/25/2022   ALT 14 10/25/2022   Last lipids Lab Results  Component Value Date   CHOL 135 10/07/2022   HDL 43 10/07/2022   LDLCALC 64 10/07/2022   TRIG 164 (H) 10/07/2022   CHOLHDL 3.1 10/07/2022   Last hemoglobin A1c Lab Results  Component  Value Date   HGBA1C 7.4 (H) 04/11/2023        Objective    BP (!) 88/51   Pulse 64   Ht 5' 7 (1.702 m)   Wt 156 lb (70.8 kg)   SpO2 99%   BMI 24.43 kg/m  BP Readings from Last 3 Encounters:  08/12/23 (!) 88/51  08/05/23 (!) 150/73  04/11/23 135/79   Wt Readings from Last 3 Encounters:  08/12/23 156 lb (70.8 kg)  08/05/23 160 lb 15 oz (73 kg)  04/20/23 161 lb (73 kg)       Physical Exam Vitals reviewed.  Constitutional:      General: He is not in acute distress.    Appearance: Normal appearance. He is not ill-appearing.  Cardiovascular:     Rate and Rhythm: Normal rate and regular rhythm.  Pulmonary:     Effort: Pulmonary effort is normal. No respiratory distress.     Breath sounds: No wheezing, rhonchi or rales.   Neurological:     Mental Status: He is alert and oriented to person, place, and time.   Psychiatric:        Mood and Affect: Mood normal.        Behavior: Behavior normal.       No results found for any visits on 08/12/23.   Assessment & Plan     Problem List Items Addressed This Visit       Cardiovascular and Mediastinum   Hypertension associated with diabetes (HCC) - Primary   Well controlled with losartan  50 mg daily and metoprolol  25 mg daily. Blood pressure today is 88/51 mmHg. Repeat 98/68, pt is fasting Chronic, well controlled  - Continue current antihypertensive medications      Relevant Orders   CMP14+EGFR   CAD S/P percutaneous coronary angioplasty   CAD in native artery     Digestive   Gastro-esophageal reflux disease without esophagitis   Diverticulosis     Endocrine   Diabetes mellitus (HCC)   Relevant Medications   Insulin  Pen Needle (BD PEN NEEDLE NANO 2ND GEN) 32G X 4 MM MISC   Other Relevant Orders   Hemoglobin A1c     Other   Hyperlipidemia   Relevant Orders   Lipid panel   Avitaminosis D   Relevant Orders   VITAMIN D 25 Hydroxy (Vit-D Deficiency, Fractures)    Assessment and Plan    Type 2  Diabetes Mellitus He is on Lantus  insulin , 4 units twice daily. Blood sugar levels fluctuate, with fasting at 134 mg/dL and current at 952 mg/dL. He reports issues with his Dexcom device inaccurately indicating hypoglycemia. A1c will be checked today to assess long-term glucose control. He mentioned a fall last week without significant injuries. If A1c is well below 7, insulin  dosage may be reduced. If needle supply is insufficient, insulin  may be reduced to once daily. - Check A1c level - Check metabolic panel - Check vitamin D level - Send prescription for Lantus  to [facility] in July - Send prescription for needles with a year's worth of refills - Check for samples of needles - Consider reducing insulin  to once daily if needle supply is insufficient - Monitor blood sugar levels and adjust insulin  as needed  Hypertension He is on losartan  50 mg and metoprolol  25 mg daily. Blood pressure today was 88/51 mmHg, but he reports no dizziness or lightheadedness. At home, blood pressure is around 138/80 mmHg. Today's low reading may be due to fasting and medication intake without food. If blood pressure remains low, losartan  dosage may be reduced. - Monitor blood pressure - Consider reducing losartan  to 25 mg if blood pressure remains low  Peripheral Neuropathy He experiences hand numbness despite gabapentin  use, raising concerns about safe driving. Gabapentin  100 mg at bedtime helps with neuropathic pain but not numbness. If numbness worsens or weakness develops, referral to neurology for nerve conduction studies may be considered. - Continue gabapentin  100 mg at bedtime - Consider referral to neurology for nerve conduction studies if numbness worsens or if weakness develops  Shoulder Pain He reports shoulder pain following a fall last Friday, particularly when raising the arm to about 90 degrees. X-ray shows no fracture. Pain is likely musculoskeletal and may improve with time. - Recommend Tylenol  for pain - Apply heat to the shoulder - Monitor for improvement  General Health Maintenance He is on vitamin D supplementation for deficiency. Vitamin D levels will be checked today to ensure adequacy. - Check vitamin D level         Return in about 4 months (around 12/12/2023) for DM.         Mimi Alt, MD  [facility] [phone] (phone) [phone] (fax)  [facility]

## 2023-08-13 LAB — LIPID PANEL
Chol/HDL Ratio: 3.5 ratio (ref 0.0–5.0)
Cholesterol, Total: 131 mg/dL (ref 100–199)
HDL: 37 mg/dL — ABNORMAL LOW (ref >39)
LDL Chol Calc (NIH): 72 mg/dL (ref 0–99)
Triglycerides: 120 mg/dL (ref 0–149)
VLDL Cholesterol Cal: 22 mg/dL (ref 5–40)

## 2023-08-13 LAB — CMP14+EGFR
ALT: 11 IU/L (ref 0–44)
AST: 17 IU/L (ref 0–40)
Albumin: 4.2 g/dL (ref 3.8–4.8)
Alkaline Phosphatase: 70 IU/L (ref 44–121)
BUN/Creatinine Ratio: 20 (ref 10–24)
BUN: 23 mg/dL (ref 8–27)
Bilirubin Total: 1 mg/dL (ref 0.0–1.2)
CO2: 22 mmol/L (ref 20–29)
Calcium: 9.4 mg/dL (ref 8.6–10.2)
Chloride: 100 mmol/L (ref 96–106)
Creatinine, Ser: 1.15 mg/dL (ref 0.76–1.27)
Globulin, Total: 1.9 g/dL (ref 1.5–4.5)
Glucose: 149 mg/dL — ABNORMAL HIGH (ref 70–99)
Potassium: 5 mmol/L (ref 3.5–5.2)
Sodium: 136 mmol/L (ref 134–144)
Total Protein: 6.1 g/dL (ref 6.0–8.5)
eGFR: 64 mL/min/{1.73_m2} (ref >59)

## 2023-08-13 LAB — VITAMIN D 25 HYDROXY (VIT D DEFICIENCY, FRACTURES): Vit D, 25-Hydroxy: 39 ng/mL (ref 30.0–100.0)

## 2023-08-13 LAB — HEMOGLOBIN A1C
Est. average glucose Bld gHb Est-mCnc: 146 mg/dL
Hgb A1c MFr Bld: 6.7 % — ABNORMAL HIGH (ref 4.8–5.6)

## 2023-08-16 ENCOUNTER — Ambulatory Visit: Payer: Self-pay | Admitting: Family Medicine

## 2023-08-31 ENCOUNTER — Ambulatory Visit: Admitting: Dietician

## 2023-09-01 ENCOUNTER — Encounter: Payer: Self-pay | Admitting: Dietician

## 2023-09-01 ENCOUNTER — Encounter: Attending: Family Medicine | Admitting: Dietician

## 2023-09-01 DIAGNOSIS — Z713 Dietary counseling and surveillance: Secondary | ICD-10-CM | POA: Insufficient documentation

## 2023-09-01 DIAGNOSIS — N183 Chronic kidney disease, stage 3 unspecified: Secondary | ICD-10-CM | POA: Diagnosis present

## 2023-09-01 DIAGNOSIS — E1122 Type 2 diabetes mellitus with diabetic chronic kidney disease: Secondary | ICD-10-CM | POA: Insufficient documentation

## 2023-09-01 DIAGNOSIS — Z794 Long term (current) use of insulin: Secondary | ICD-10-CM | POA: Insufficient documentation

## 2023-09-01 DIAGNOSIS — E0822 Diabetes mellitus due to underlying condition with diabetic chronic kidney disease: Secondary | ICD-10-CM | POA: Diagnosis present

## 2023-09-01 NOTE — Progress Notes (Signed)
 Diabetes Self-Management Education  Visit Type: First/Initial  Appt. Start Time: 1530 Appt. End Time: 1645  09/01/2023  [name], identified by name and date of birth, is a 78 y.o. male with a diagnosis of Diabetes: Type 2.   ASSESSMENT Pt wife, Arland, present for appointment Pt reports frustration over inability to keep A1c under control. Pt wife reports pt was losing weight unintentionally last summer, got down under 150 lbs, has been able to gain back to ~160 lbs. Pt reports taking Lantus  @4u  BID, in the morning and in the evening, Glipizide , Metformin , and Pioglitazone  daily. Pt reports trying to use Dexcom, states they have been having lots of difficulty with faulty sensors. Pt reports recent increase in fatigue when being active, states this is mostly when they are doing outdoor activities in the heat.   Diabetes Self-Management Education - 09/01/23 1655       Visit Information   Visit Type First/Initial      Initial Visit   Diabetes Type Type 2    Date Diagnosed 1995    Are you currently following a meal plan? No    Are you taking your medications as prescribed? Yes      Health Coping   How would you rate your overall health? Good      Psychosocial Assessment   Patient Belief/Attitude about Diabetes Defeat/Burnout    What is the hardest part about your diabetes right now, causing you the most concern, or is the most worrisome to you about your diabetes?   Checking blood sugar;Making healty food and beverage choices    Self-management support Doctor's office;Friends;Family    Other persons present Patient;Spouse/SO    Patient Concerns Glycemic Control    Special Needs None    Preferred Learning Style Visual;Auditory    Learning Readiness Not Ready    How often do you need to have someone help you when you read instructions, pamphlets, or other written materials from your doctor or pharmacy? 1 - Never    What is the last grade level you completed in school? 12th  grade      Pre-Education Assessment   Patient understands the diabetes disease and treatment process. Needs Instruction    Patient understands incorporating nutritional management into lifestyle. Needs Review    Patient undertands incorporating physical activity into lifestyle. Comprehends key points    Patient understands using medications safely. Comprehends key points    Patient understands monitoring blood glucose, interpreting and using results Needs Review    Patient understands prevention, detection, and treatment of acute complications. Needs Review    Patient understands prevention, detection, and treatment of chronic complications. Needs Review    Patient understands how to develop strategies to address psychosocial issues. Needs Review    Patient understands how to develop strategies to promote health/change behavior. Needs Review      Complications   Last HgB A1C per patient/outside source 6.7 %   08/12/2023   How often do you check your blood sugar? > 4 times/day   CGM   Fasting Blood glucose range (mg/dL) 869-820;29-870    Postprandial Blood glucose range (mg/dL) 819-799;869-820;>799    Have you had a dilated eye exam in the past 12 months? Yes    Have you had a dental exam in the past 12 months? Yes    Are you checking your feet? No      Dietary Intake   Breakfast 2 scrambled eggs, 2 slices bacon, small glass of milk, cinnamon oatmeal  or grits    Snack (morning) Sugar free Yogurt    Lunch Chicken salad sandwich on white    Snack (afternoon) Ham, cheese, crackers    Dinner Chicken, green beans, tomatoes, cream potatoes    Beverage(s) water      Activity / Exercise   Activity / Exercise Type ADL's;Light (walking / raking leaves)    How many days per week do you exercise? 5    How many minutes per day do you exercise? 60    Total minutes per week of exercise 300      Patient Education   Previous Diabetes Education Yes (please comment)   At first diagnosis   Disease  Pathophysiology Factors that contribute to the development of diabetes;Explored patient's options for treatment of their diabetes    Healthy Eating Role of diet in the treatment of diabetes and the relationship between the three main macronutrients and blood glucose level    Being Active Role of exercise on diabetes management, blood pressure control and cardiac health.;Helped patient identify appropriate exercises in relation to his/her diabetes, diabetes complications and other health issue.;Identified with patient nutritional and/or medication changes necessary with exercise.    Medications Reviewed patients medication for diabetes, action, purpose, timing of dose and side effects.;Taught/reviewed insulin /injectables, injection, site rotation, insulin /injectables storage and needle disposal.    Monitoring Taught/evaluated CGM (comment);Identified appropriate SMBG and/or A1C goals.    Acute complications Taught prevention, symptoms, and  treatment of hypoglycemia - the 15 rule.    Chronic complications Relationship between chronic complications and blood glucose control    Diabetes Stress and Support Identified and addressed patients feelings and concerns about diabetes;Worked with patient to identify barriers to care and solutions;Brainstormed with patient on coping mechanisms for social situations, getting support from significant others, dealing with feelings about diabetes      Individualized Goals (developed by patient)   Nutrition Follow meal plan discussed    Physical Activity Exercise 5-7 days per week    Medications take my medication as prescribed    Monitoring  Consistenly use CGM;Test my blood glucose as discussed      Post-Education Assessment   Patient understands the diabetes disease and treatment process. Comprehends key points    Patient understands incorporating nutritional management into lifestyle. Comprehends key points    Patient undertands incorporating physical activity  into lifestyle. Demonstrates understanding / competency    Patient understands using medications safely. Comphrehends key points    Patient understands monitoring blood glucose, interpreting and using results Comprehends key points    Patient understands prevention, detection, and treatment of acute complications. Comprehends key points    Patient understands prevention, detection, and treatment of chronic complications. Comprehends key points    Patient understands how to develop strategies to address psychosocial issues. Comprehends key points    Patient understands how to develop strategies to promote health/change behavior. Comprehends key points      Outcomes   Expected Outcomes Demonstrated interest in learning but significant barriers to change    Future DMSE PRN    Program Status Completed          Individualized Plan for Diabetes Self-Management Training:   Learning Objective:  Patient will have a greater understanding of diabetes self-management. Patient education plan is to attend individual and/or group sessions per assessed needs and concerns.   Plan:   Patient Instructions  If you eat a meal that you know is going to raise your blood sugar higher than normal, go far  a walk and have a glass of water about an hour after you finish  Check your blood sugar each morning before eating or drinking (fasting). Look for numbers under 130 mg/dL Check your blood sugar 2 hours after you begin eating a meal. Look for numbers under 180 mg/dL at all times.  Your goal A1c is below 7.0%    Expected Outcomes:  Demonstrated interest in learning but significant barriers to change  Education material provided: CGM Overview, CGM and Nutrition  If problems or questions, patient to contact team via:  Phone and Email  Future DSME appointment: PRN

## 2023-09-01 NOTE — Patient Instructions (Addendum)
 If you eat a meal that you know is going to raise your blood sugar higher than normal, go far a walk and have a glass of water about an hour after you finish  Check your blood sugar each morning before eating or drinking (fasting). Look for numbers under 130 mg/dL Check your blood sugar 2 hours after you begin eating a meal. Look for numbers under 180 mg/dL at all times.  Your goal A1c is below 7.0%

## 2023-09-06 ENCOUNTER — Ambulatory Visit (INDEPENDENT_AMBULATORY_CARE_PROVIDER_SITE_OTHER): Admitting: Family Medicine

## 2023-09-06 ENCOUNTER — Encounter: Payer: Self-pay | Admitting: Family Medicine

## 2023-09-06 VITALS — BP 104/60 | HR 66 | Ht 67.0 in | Wt 156.0 lb

## 2023-09-06 DIAGNOSIS — M67912 Unspecified disorder of synovium and tendon, left shoulder: Secondary | ICD-10-CM | POA: Diagnosis not present

## 2023-09-06 NOTE — Progress Notes (Signed)
 Established patient visit   Patient: Carl Dominguez   DOB: 1945/03/28   78 y.o. Male  MRN: [id number] Visit Date: 09/06/2023  Today's healthcare provider: Rockie Agent, MD   Chief Complaint  Patient presents with   Shoulder Pain    Still having pain, he has to use his R hand to lift his L arm because the pain get so bad    Subjective     HPI     Shoulder Pain    Additional comments: Still having pain, he has to use his R hand to lift his L arm because the pain get so bad       Last edited by Thelbert Eulalio HERO, CMA on 09/06/2023  1:11 PM.       Discussed the use of AI scribe software for clinical note transcription with the patient, who gave verbal consent to proceed.  History of Present Illness Carl Dominguez is an 78 year old male with coronary artery disease who presents with left shoulder pain and weakness.  He has been experiencing ongoing pain and weakness in his left shoulder for over a month. The pain is persistent, particularly when moving his arm in certain ways, such as lifting it or reaching behind his back. It is more pronounced during activities like catching a basketball. The pain is located in the front and posterior lateral part of the shoulder, and he sometimes feels something 'going back in place' when moving the arm downwards. The pain is minimally responsive to anti-inflammatory efforts.  He is currently taking gabapentin  for hand pain and Tylenol twice daily, with an additional dose during the day if needed. He avoids narcotics due to concerns about their effects and prefers not to take them unless absolutely necessary.  He recalls an incident where he fell and hit the floor, which may have contributed to the shoulder issue. Despite the pain, he can still play golf, although certain movements exacerbate the pain.  He has a history of coronary artery disease, which influences his medication choices, particularly avoiding NSAIDs. He manages  his blood sugar levels, with a recent A1c of 6.7, indicating well-controlled diabetes.  No unbearable pain, but reports persistent discomfort in the left shoulder. No fever or other systemic symptoms.     Past Medical History:  Diagnosis Date   Colon polyp    Coronary artery disease    Diabetes mellitus without complication (HCC)    Dyspnea    Dysrhythmia    GERD (gastroesophageal reflux disease)    Heart disease    Hemorrhoids    Hyperlipidemia    Hypertension    Myocardial infarction (HCC) 1999    Medications: Outpatient Medications Prior to Visit  Medication Sig   acetaminophen (TYLENOL) 500 MG tablet Take 1,000 mg by mouth 2 (two) times daily.   aspirin 81 MG tablet Take 81 mg by mouth daily.    atorvastatin  (LIPITOR) 40 MG tablet TAKE 1 TABLET AT BEDTIME   Blood Glucose Calibration (TRUE METRIX LEVEL 1) Low SOLN 1 each by In Vitro route daily.   Blood Glucose Monitoring Suppl (FREESTYLE FREEDOM) KIT by Does not apply route.   cetirizine  (ZYRTEC ) 10 MG tablet TAKE 1 TABLET EVERY DAY   cholecalciferol (VITAMIN D ) 1000 UNITS tablet Take 1,000 Units by mouth daily.    Continuous Glucose Sensor (DEXCOM G7 SENSOR) MISC by Does not apply route.   famotidine (PEPCID) 20 MG tablet Take 20 mg by mouth daily.  fluticasone  (FLONASE ) 50 MCG/ACT nasal spray Place into both nostrils daily as needed.   gabapentin  (NEURONTIN ) 100 MG capsule Take 1 capsule (100 mg total) by mouth 3 (three) times daily.   glipiZIDE  (GLUCOTROL ) 10 MG tablet TAKE 1 TABLET EVERY DAY   insulin  glargine (LANTUS ) 100 UNIT/ML Solostar Pen Inject 4 Units into the skin 2 (two) times daily.   Insulin  Pen Needle (BD PEN NEEDLE NANO 2ND GEN) 32G X 4 MM MISC USE WITH LANTUS  INSULIN  FOR 2 INJECTIONS DAILY. REPLACE NEEDLE FOR EACH INJECTION   Lactobacillus (PROBIOTIC ACIDOPHILUS PO) Take 1 tablet by mouth daily.   latanoprost (XALATAN) 0.005 % ophthalmic solution Place 1 drop into both eyes at bedtime.    losartan   (COZAAR ) 50 MG tablet TAKE 1 TABLET EVERY DAY   metFORMIN  (GLUCOPHAGE -XR) 500 MG 24 hr tablet Take 1 tablet (500 mg total) by mouth daily with breakfast.   metoprolol  succinate (TOPROL -XL) 25 MG 24 hr tablet TAKE 1 TABLET EVERY DAY   montelukast  (SINGULAIR ) 10 MG tablet TAKE 1 TABLET AT BEDTIME   pioglitazone  (ACTOS ) 45 MG tablet TAKE 1 TABLET EVERY DAY   polyethylene glycol (MIRALAX / GLYCOLAX) 17 g packet Take 17 g by mouth daily as needed for mild constipation.   psyllium (METAMUCIL SMOOTH TEXTURE) 28 % packet Take 1 packet by mouth 2 (two) times daily.    timolol  (TIMOPTIC ) 0.5 % ophthalmic solution 1 drop every morning.   triamcinolone cream (KENALOG) 0.1 % Apply 1 Application topically 2 (two) times daily as needed. (Patient not taking: Reported on 04/20/2023)   No facility-administered medications prior to visit.    Review of Systems  Last metabolic panel Lab Results  Component Value Date   GLUCOSE 149 (H) 08/12/2023   NA 136 08/12/2023   K 5.0 08/12/2023   CL 100 08/12/2023   CO2 22 08/12/2023   BUN 23 08/12/2023   CREATININE 1.15 08/12/2023   EGFR 64 08/12/2023   CALCIUM  9.4 08/12/2023   PROT 6.1 08/12/2023   ALBUMIN 4.2 08/12/2023   LABGLOB 1.9 08/12/2023   AGRATIO 2.3 (H) 08/11/2021   BILITOT 1.0 08/12/2023   ALKPHOS 70 08/12/2023   AST 17 08/12/2023   ALT 11 08/12/2023        Objective    BP 104/60   Pulse 66   Ht 5' 7 (1.702 m)   Wt 156 lb (70.8 kg)   SpO2 98%   BMI 24.43 kg/m  BP Readings from Last 3 Encounters:  09/06/23 104/60  08/12/23 (!) 88/51  08/05/23 (!) 150/73   Wt Readings from Last 3 Encounters:  09/06/23 156 lb (70.8 kg)  08/12/23 156 lb (70.8 kg)  08/05/23 160 lb 15 oz (73 kg)        Physical Exam  Physical Exam MUSCULOSKELETAL: Normal range of motion with forward flexion of left shoulder. No deformities or erythema in left shoulder. Tenderness to palpation of lateral posterior left shoulder. Abduction of left shoulder limited  to 80 degrees. Forward flexion of left shoulder is 90 degrees. Tenderness with shoulder flexion above head. Significant pain with empty can test on left shoulder. Significant pain with external rotation against resistance on left shoulder.    No results found for any visits on 09/06/23.  Assessment & Plan     Problem List Items Addressed This Visit   None Visit Diagnoses       Dysfunction of left rotator cuff    -  Primary   Relevant Orders   AMB referral  to orthopedics        Assessment & Plan Left Shoulder Pain Chronic left shoulder pain and weakness persisting for over a month, localized to the posterior lateral aspect. Pain exacerbated by external rotation and abduction, with a sensation of joint displacement or ligament tear. Differential diagnosis includes rotator cuff pathology, joint displacement, or ligament tear. Minimally responsive to anti-inflammatory efforts, with no evidence of arthritis or bursitis. Prefers conservative pain management due to concerns about narcotic dependency. - Refer to orthopedics for further evaluation and possible imaging such as ultrasound or MRI to assess for rotator cuff pathology or other soft tissue injuries. - Recommend conservative management with heat application and over-the-counter options like Salonpas or lidocaine  patches for pain relief. - Suggest the use of Voltaren gel as a topical NSAID alternative, given coronary artery disease and the need to avoid oral NSAIDs.  Coronary Artery Disease Coronary artery disease with advised avoidance of NSAIDs due to cardiovascular risks.  Diabetes Mellitus Diabetes is well-controlled with a recent A1c of 6.7%.  Follow-up Awaiting referral to orthopedics for further evaluation of shoulder condition. - Submit referral to orthopedics for evaluation of shoulder pain. - Advise follow-up as needed if pain worsens or becomes unmanageable.    No follow-ups on file.      Rockie Agent, MD  [facility] [phone] (phone) [phone] (fax)  [facility]

## 2023-09-06 NOTE — Patient Instructions (Signed)
 Voltaren gel, heating pad

## 2023-09-08 ENCOUNTER — Telehealth: Payer: Self-pay | Admitting: Family Medicine

## 2023-09-08 ENCOUNTER — Telehealth: Payer: Self-pay

## 2023-09-08 MED ORDER — INSULIN GLARGINE 100 UNIT/ML SOLOSTAR PEN: 4.0000 [IU] | PEN_INJECTOR | Freq: Two times a day (BID) | SUBCUTANEOUS | Status: DC

## 2023-09-08 NOTE — Telephone Encounter (Signed)
 Copied from CRM [id number]. Topic: Clinical - Medication Refill >> Sep 08, 2023  1:11 PM Chasity T wrote: Medication: Insulin  Pen Needle (BD PEN NEEDLE NANO 2ND GEN) 32G X 4 MM MISC   Has the patient contacted their pharmacy? Yes   This is the patient's preferred pharmacy:  [id number] - [place], [place] - [address] AT [facility] [address] [place] [address] [place] [address] Phone: [phone] Fax: [phone]   Is this the correct pharmacy for this prescription? Yes If no, delete pharmacy and type the correct one.   Has the prescription been filled recently? No  Is the patient out of the medication? Yes  Has the patient been seen for an appointment in the last year OR does the patient have an upcoming appointment? Yes  Can we respond through MyChart? Yes  Agent: Please be advised that Rx refills may take up to 3 business days. We ask that you follow-up with your pharmacy.

## 2023-09-08 NOTE — Telephone Encounter (Unsigned)
 Copied from CRM [id number]. Topic: Clinical - Medication Refill >> Sep 08, 2023  1:11 PM Chasity T wrote: Medication: Insulin  Pen Needle (BD PEN NEEDLE NANO 2ND GEN) 32G X 4 MM MISC   Has the patient contacted their pharmacy? Yes   This is the patient's preferred pharmacy:  [id number] - [place] - [address] AT [facility] [address] [place] [address] [place] [address] Phone: [phone] Fax: [phone]   Is this the correct pharmacy for this prescription? Yes If no, delete pharmacy and type the correct one.   Has the prescription been filled recently? No  Is the patient out of the medication? Yes  Has the patient been seen for an appointment in the last year OR does the patient have an upcoming appointment? Yes  Can we respond through MyChart? Yes  Agent: Please be advised that Rx refills may take up to 3 business days. We ask that you follow-up with your pharmacy.

## 2023-09-08 NOTE — Telephone Encounter (Signed)
 Spoke with pt confirmed needs insulin  glargine (LANTUS ) 100 UNIT/ML Solostar . As he is running low. Pt verbalized to use [facility] in [place].

## 2023-09-13 ENCOUNTER — Telehealth: Payer: Self-pay

## 2023-09-13 ENCOUNTER — Other Ambulatory Visit: Payer: Self-pay | Admitting: Family Medicine

## 2023-09-13 DIAGNOSIS — Z794 Long term (current) use of insulin: Secondary | ICD-10-CM

## 2023-09-13 NOTE — Progress Notes (Signed)
 Updated nutrition and diabetes services referral submitted from [facility] on 04/20/23

## 2023-09-13 NOTE — Telephone Encounter (Signed)
 Updated nutrition and diabetes services referral submitted from [facility] on 04/20/23

## 2023-09-13 NOTE — Telephone Encounter (Signed)
 Copied from CRM [id number]. Topic: Referral - Question >> Sep 13, 2023  2:50 PM Yolanda T wrote: Reason for CRM: Natalie [phone] from [facility] called said they have a claim coming back for date of service 04/20/2023 and need an approved referral for the [facility]. Please send approved referral to fax# [phone]

## 2023-09-19 ENCOUNTER — Other Ambulatory Visit: Payer: Self-pay

## 2023-09-19 DIAGNOSIS — E119 Type 2 diabetes mellitus without complications: Secondary | ICD-10-CM

## 2023-09-19 MED ORDER — BD PEN NEEDLE NANO 2ND GEN 32G X 4 MM MISC: 11 refills | Status: AC

## 2023-09-20 ENCOUNTER — Ambulatory Visit: Admitting: Family Medicine

## 2023-09-21 ENCOUNTER — Telehealth: Payer: Self-pay | Admitting: Family Medicine

## 2023-09-21 MED ORDER — INSULIN GLARGINE 100 UNIT/ML SOLOSTAR PEN: 4.0000 [IU] | PEN_INJECTOR | Freq: Two times a day (BID) | SUBCUTANEOUS | 1 refills | Status: DC

## 2023-09-21 NOTE — Telephone Encounter (Signed)
 Copied from CRM [id number]. Topic: Clinical - Prescription Issue >> Sep 21, 2023  9:20 AM Annabella S wrote: Reason for CRM: Insulin  Pen Needle (BD PEN NEEDLE NANO 2ND GEN) 32G X 4 MM MISC.   Patient is needing Pens ordered patient has called multiples times but no update please follow up with patient

## 2023-09-21 NOTE — Addendum Note (Signed)
 Addended by: THELBERT EULALIO HERO on: 09/21/2023 10:38 AM   Modules accepted: Orders

## 2023-09-21 NOTE — Telephone Encounter (Signed)
 [facility] is requesting refills on Lantus -Solostar U100 insulin  100 unit/ml

## 2023-09-22 ENCOUNTER — Other Ambulatory Visit: Payer: Self-pay | Admitting: Family Medicine

## 2023-09-22 MED ORDER — INSULIN GLARGINE 100 UNIT/ML SOLOSTAR PEN: 4.0000 [IU] | PEN_INJECTOR | Freq: Two times a day (BID) | SUBCUTANEOUS | 3 refills | Status: DC

## 2023-09-29 ENCOUNTER — Other Ambulatory Visit: Payer: Self-pay

## 2023-09-29 MED ORDER — INSULIN GLARGINE 100 UNIT/ML SOLOSTAR PEN: 4.0000 [IU] | PEN_INJECTOR | Freq: Two times a day (BID) | SUBCUTANEOUS | 3 refills | Status: DC

## 2023-09-29 NOTE — Telephone Encounter (Signed)
 Rx had been sent to the wrong pharmacy.  New Rx sent to [facility]

## 2023-09-29 NOTE — Telephone Encounter (Signed)
 Received another fax from [facility] requesting insulin  glargine (LANTUS ) 100 UNIT/ML Solostar Pen.  Looks like it was sent to [facility].  Please send to correct pharmacy

## 2023-10-02 DIAGNOSIS — Z794 Long term (current) use of insulin: Secondary | ICD-10-CM | POA: Diagnosis not present

## 2023-10-02 DIAGNOSIS — E1165 Type 2 diabetes mellitus with hyperglycemia: Secondary | ICD-10-CM | POA: Diagnosis not present

## 2023-10-11 ENCOUNTER — Ambulatory Visit: Admitting: Family Medicine

## 2023-10-12 DIAGNOSIS — M545 Low back pain, unspecified: Secondary | ICD-10-CM | POA: Diagnosis not present

## 2023-10-12 DIAGNOSIS — M9901 Segmental and somatic dysfunction of cervical region: Secondary | ICD-10-CM | POA: Diagnosis not present

## 2023-10-12 DIAGNOSIS — M9903 Segmental and somatic dysfunction of lumbar region: Secondary | ICD-10-CM | POA: Diagnosis not present

## 2023-10-12 DIAGNOSIS — M531 Cervicobrachial syndrome: Secondary | ICD-10-CM | POA: Diagnosis not present

## 2023-10-12 DIAGNOSIS — M546 Pain in thoracic spine: Secondary | ICD-10-CM | POA: Diagnosis not present

## 2023-10-12 DIAGNOSIS — M9902 Segmental and somatic dysfunction of thoracic region: Secondary | ICD-10-CM | POA: Diagnosis not present

## 2023-10-28 DIAGNOSIS — M19012 Primary osteoarthritis, left shoulder: Secondary | ICD-10-CM | POA: Diagnosis not present

## 2023-10-28 DIAGNOSIS — M1611 Unilateral primary osteoarthritis, right hip: Secondary | ICD-10-CM | POA: Diagnosis not present

## 2023-10-28 DIAGNOSIS — E119 Type 2 diabetes mellitus without complications: Secondary | ICD-10-CM | POA: Diagnosis not present

## 2023-11-01 DIAGNOSIS — Z794 Long term (current) use of insulin: Secondary | ICD-10-CM | POA: Diagnosis not present

## 2023-11-01 DIAGNOSIS — E1165 Type 2 diabetes mellitus with hyperglycemia: Secondary | ICD-10-CM | POA: Diagnosis not present

## 2023-11-09 DIAGNOSIS — M9901 Segmental and somatic dysfunction of cervical region: Secondary | ICD-10-CM | POA: Diagnosis not present

## 2023-11-09 DIAGNOSIS — M546 Pain in thoracic spine: Secondary | ICD-10-CM | POA: Diagnosis not present

## 2023-11-09 DIAGNOSIS — M9902 Segmental and somatic dysfunction of thoracic region: Secondary | ICD-10-CM | POA: Diagnosis not present

## 2023-11-09 DIAGNOSIS — M531 Cervicobrachial syndrome: Secondary | ICD-10-CM | POA: Diagnosis not present

## 2023-11-09 DIAGNOSIS — M545 Low back pain, unspecified: Secondary | ICD-10-CM | POA: Diagnosis not present

## 2023-11-09 DIAGNOSIS — M9903 Segmental and somatic dysfunction of lumbar region: Secondary | ICD-10-CM | POA: Diagnosis not present

## 2023-11-29 DIAGNOSIS — H353131 Nonexudative age-related macular degeneration, bilateral, early dry stage: Secondary | ICD-10-CM | POA: Diagnosis not present

## 2023-11-29 DIAGNOSIS — H401112 Primary open-angle glaucoma, right eye, moderate stage: Secondary | ICD-10-CM | POA: Diagnosis not present

## 2023-11-29 DIAGNOSIS — Z961 Presence of intraocular lens: Secondary | ICD-10-CM | POA: Diagnosis not present

## 2023-12-01 DIAGNOSIS — E1165 Type 2 diabetes mellitus with hyperglycemia: Secondary | ICD-10-CM | POA: Diagnosis not present

## 2023-12-01 DIAGNOSIS — Z794 Long term (current) use of insulin: Secondary | ICD-10-CM | POA: Diagnosis not present

## 2023-12-07 DIAGNOSIS — M9901 Segmental and somatic dysfunction of cervical region: Secondary | ICD-10-CM | POA: Diagnosis not present

## 2023-12-07 DIAGNOSIS — M531 Cervicobrachial syndrome: Secondary | ICD-10-CM | POA: Diagnosis not present

## 2023-12-07 DIAGNOSIS — M545 Low back pain, unspecified: Secondary | ICD-10-CM | POA: Diagnosis not present

## 2023-12-07 DIAGNOSIS — M546 Pain in thoracic spine: Secondary | ICD-10-CM | POA: Diagnosis not present

## 2023-12-07 DIAGNOSIS — M9902 Segmental and somatic dysfunction of thoracic region: Secondary | ICD-10-CM | POA: Diagnosis not present

## 2023-12-07 DIAGNOSIS — M9903 Segmental and somatic dysfunction of lumbar region: Secondary | ICD-10-CM | POA: Diagnosis not present

## 2023-12-12 ENCOUNTER — Ambulatory Visit: Admitting: Family Medicine

## 2023-12-12 ENCOUNTER — Encounter: Payer: Self-pay | Admitting: Family Medicine

## 2023-12-12 VITALS — BP 122/67 | HR 61 | Temp 97.7°F | Ht 67.0 in | Wt 154.1 lb

## 2023-12-12 DIAGNOSIS — E1169 Type 2 diabetes mellitus with other specified complication: Secondary | ICD-10-CM

## 2023-12-12 DIAGNOSIS — E785 Hyperlipidemia, unspecified: Secondary | ICD-10-CM

## 2023-12-12 DIAGNOSIS — R350 Frequency of micturition: Secondary | ICD-10-CM

## 2023-12-12 DIAGNOSIS — N401 Enlarged prostate with lower urinary tract symptoms: Secondary | ICD-10-CM | POA: Diagnosis not present

## 2023-12-12 DIAGNOSIS — E559 Vitamin D deficiency, unspecified: Secondary | ICD-10-CM | POA: Diagnosis not present

## 2023-12-12 DIAGNOSIS — Z794 Long term (current) use of insulin: Secondary | ICD-10-CM | POA: Diagnosis not present

## 2023-12-12 DIAGNOSIS — K219 Gastro-esophageal reflux disease without esophagitis: Secondary | ICD-10-CM

## 2023-12-12 DIAGNOSIS — I152 Hypertension secondary to endocrine disorders: Secondary | ICD-10-CM

## 2023-12-12 DIAGNOSIS — E1159 Type 2 diabetes mellitus with other circulatory complications: Secondary | ICD-10-CM | POA: Diagnosis not present

## 2023-12-12 DIAGNOSIS — I2510 Atherosclerotic heart disease of native coronary artery without angina pectoris: Secondary | ICD-10-CM | POA: Diagnosis not present

## 2023-12-12 DIAGNOSIS — M19011 Primary osteoarthritis, right shoulder: Secondary | ICD-10-CM

## 2023-12-12 DIAGNOSIS — E1165 Type 2 diabetes mellitus with hyperglycemia: Secondary | ICD-10-CM | POA: Diagnosis not present

## 2023-12-12 LAB — POCT GLYCOSYLATED HEMOGLOBIN (HGB A1C): Hemoglobin A1C: 6.6 % — AB (ref 4.0–5.6)

## 2023-12-12 MED ORDER — ACCU-CHEK SOFTCLIX LANCETS MISC
12 refills | Status: AC
Start: 2023-12-12 — End: ?

## 2023-12-12 NOTE — Assessment & Plan Note (Signed)
 Chronic condition managed with atorvastatin . - Continue atorvastatin  40 mg daily

## 2023-12-12 NOTE — Assessment & Plan Note (Signed)
  Atherosclerotic heart disease of native coronary artery Chronic condition managed with aspirin and metoprolol . No recent episodes of chest pain. - Continue aspirin 81 mg daily - Continue metoprolol  45 mg daily

## 2023-12-12 NOTE — Assessment & Plan Note (Addendum)
 Vitamin D  deficiency Chronic condition managed with vitamin D  supplementation. - Continue vitamin D  1000 units daily

## 2023-12-12 NOTE — Assessment & Plan Note (Signed)
 Type 2 diabetes mellitus Chronic  Well controlled with an A1c of 6.6. He is on metformin , glipizide , Actos , and insulin . Prefers to maintain current regimen despite potential to reduce insulin . Uses Dexcom CGM with occasional discrepancies between monitor and fingerstick readings. Aware of hypoglycemia symptoms. - Continue metformin  500 mg daily - Continue glipizide  10 mg daily - Continue Actos  45 mg daily - Continue Lantus  4 units twice daily - request records for updated diabetes eye exam - POC A1C ordered today

## 2023-12-12 NOTE — Assessment & Plan Note (Signed)
 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 Left shoulder previously injured but improving. - Continue Tylenol Arthritis 650 mg as needed

## 2023-12-12 NOTE — Assessment & Plan Note (Addendum)
  Gastroesophageal reflux disease (GERD) Chronic condition managed with Pepcid. - Continue Pepcid 20 mg daily

## 2023-12-12 NOTE — Progress Notes (Signed)
 Established patient visit   Patient: Carl Dominguez   DOB: 12-28-1945   78 y.o. Male  MRN: [id number] Visit Date: 12/12/2023  Today's healthcare provider: Rockie Agent, MD   Chief Complaint  Patient presents with   Medical Management of Chronic Issues    Patient reports doing well no acute issues today    Diabetes   Subjective     HPI     Medical Management of Chronic Issues    Additional comments: Patient reports doing well no acute issues today       Last edited by Cherry Chiquita HERO, CMA on 12/12/2023  8:33 AM.       Discussed the use of AI scribe software for clinical note transcription with the patient, who gave verbal consent to proceed.  History of Present Illness Carl Dominguez is an 78 year old male with type two diabetes, hypertension, and coronary artery disease who presents for a chronic follow-up.  He is on metformin  500 mg daily, glipizide  10 mg daily, Actos  45 mg daily, and Lantus  4 units twice daily. He uses a continuous glucose monitor and notes occasional discrepancies between the monitor and finger stick readings. He experiences fluctuations in blood sugar levels, particularly postprandially, but manages them with dietary adjustments.  His blood pressure reading was 122/67. He continues to take losartan  50 mg daily and metoprolol  45 mg daily.  For coronary artery disease, he continues aspirin 81 mg daily. No recent chest pain.  He has a history of allergic rhinitis and continues cetirizine  10 mg daily, Singulair  10 mg daily, and Flonase  50 mcg daily in each nostril.  He reports arthritis primarily in his hands and has recently started taking Tylenol 650 mg for arthritis pain. A recent shoulder x-ray revealed significant arthritis on the right side, though his left shoulder was previously injured from a fall.  He manages his GERD with Pepcid 20 mg daily, noting that symptoms return if he misses a dose.  He has benign prostatic  hyperplasia and reports frequent urination, which affects his water intake. He does not like drinking water but acknowledges the need to do so.  He is on vitamin D  1000 units daily for vitamin D  deficiency.  He mentions having an excess supply of test strips and eye drops due to frequent shipments from his insurance.     Past Medical History:  Diagnosis Date   Colon polyp    Coronary artery disease    Diabetes mellitus without complication (HCC)    Dyspnea    Dysrhythmia    GERD (gastroesophageal reflux disease)    Heart disease    Hemorrhoids    Hyperlipidemia    Hypertension    Myocardial infarction (HCC) 1999    Medications: Outpatient Medications Prior to Visit  Medication Sig   acetaminophen (TYLENOL) 650 MG CR tablet Take 650 mg by mouth every 8 (eight) hours as needed for pain (arthritis pain).   aspirin 81 MG tablet Take 81 mg by mouth daily.    atorvastatin  (LIPITOR) 40 MG tablet TAKE 1 TABLET AT BEDTIME   Blood Glucose Calibration (TRUE METRIX LEVEL 1) Low SOLN 1 each by In Vitro route daily.   Blood Glucose Monitoring Suppl (FREESTYLE FREEDOM) KIT by Does not apply route.   cetirizine  (ZYRTEC ) 10 MG tablet TAKE 1 TABLET EVERY DAY   cholecalciferol (VITAMIN D ) 1000 UNITS tablet Take 1,000 Units by mouth daily.    Continuous Glucose Sensor (DEXCOM G7 SENSOR) MISC by  Does not apply route.   famotidine (PEPCID) 20 MG tablet Take 20 mg by mouth daily.    fluticasone  (FLONASE ) 50 MCG/ACT nasal spray Place into both nostrils daily as needed.   gabapentin  (NEURONTIN ) 100 MG capsule Take 1 capsule (100 mg total) by mouth 3 (three) times daily.   glipiZIDE  (GLUCOTROL ) 10 MG tablet TAKE 1 TABLET EVERY DAY   insulin  glargine (LANTUS ) 100 UNIT/ML Solostar Pen Inject 4 Units into the skin 2 (two) times daily. Inject 4 Units into the skin 2 (two) times daily.   Insulin  Pen Needle (BD PEN NEEDLE NANO 2ND GEN) 32G X 4 MM MISC USE WITH LANTUS  INSULIN  FOR 2 INJECTIONS DAILY. REPLACE  NEEDLE FOR EACH INJECTION   Lactobacillus (PROBIOTIC ACIDOPHILUS PO) Take 1 tablet by mouth daily.   latanoprost (XALATAN) 0.005 % ophthalmic solution Place 1 drop into both eyes at bedtime.    losartan  (COZAAR ) 50 MG tablet TAKE 1 TABLET EVERY DAY   metFORMIN  (GLUCOPHAGE -XR) 500 MG 24 hr tablet Take 1 tablet (500 mg total) by mouth daily with breakfast.   metoprolol  succinate (TOPROL -XL) 25 MG 24 hr tablet TAKE 1 TABLET EVERY DAY   montelukast  (SINGULAIR ) 10 MG tablet TAKE 1 TABLET AT BEDTIME   pioglitazone  (ACTOS ) 45 MG tablet TAKE 1 TABLET EVERY DAY   polyethylene glycol (MIRALAX / GLYCOLAX) 17 g packet Take 17 g by mouth daily as needed for mild constipation.   psyllium (METAMUCIL SMOOTH TEXTURE) 28 % packet Take 1 packet by mouth 2 (two) times daily.    timolol  (TIMOPTIC ) 0.5 % ophthalmic solution 1 drop every morning.   triamcinolone cream (KENALOG) 0.1 % Apply 1 Application topically 2 (two) times daily as needed.   [DISCONTINUED] acetaminophen (TYLENOL) 500 MG tablet Take 1,000 mg by mouth 2 (two) times daily.   No facility-administered medications prior to visit.    Review of Systems  Last CBC Lab Results  Component Value Date   WBC 8.1 08/11/2021   HGB 13.0 08/11/2021   HCT 38.2 08/11/2021   MCV 95 08/11/2021   MCH 32.3 08/11/2021   RDW 12.7 08/11/2021   PLT 170 08/11/2021   Last metabolic panel Lab Results  Component Value Date   GLUCOSE 149 (H) 08/12/2023   NA 136 08/12/2023   K 5.0 08/12/2023   CL 100 08/12/2023   CO2 22 08/12/2023   BUN 23 08/12/2023   CREATININE 1.15 08/12/2023   EGFR 64 08/12/2023   CALCIUM  9.4 08/12/2023   PROT 6.1 08/12/2023   ALBUMIN 4.2 08/12/2023   LABGLOB 1.9 08/12/2023   AGRATIO 2.3 (H) 08/11/2021   BILITOT 1.0 08/12/2023   ALKPHOS 70 08/12/2023   AST 17 08/12/2023   ALT 11 08/12/2023   Last lipids Lab Results  Component Value Date   CHOL 131 08/12/2023   HDL 37 (L) 08/12/2023   LDLCALC 72 08/12/2023   TRIG 120  08/12/2023   CHOLHDL 3.5 08/12/2023   Last hemoglobin A1c Lab Results  Component Value Date   HGBA1C 6.6 (A) 12/12/2023   Last thyroid  functions Lab Results  Component Value Date   TSH 1.400 08/11/2021   Last vitamin D  Lab Results  Component Value Date   VD25OH 39.0 08/12/2023       Objective    BP 122/67 (BP Location: Left Arm, Patient Position: Sitting, Cuff Size: Normal)   Pulse 61   Temp 97.7 F (36.5 C) (Oral)   Ht 5' 7 (1.702 m)   Wt 154 lb 1.6 oz (69.9  kg)   SpO2 100%   BMI 24.14 kg/m  BP Readings from Last 3 Encounters:  12/12/23 122/67  09/06/23 104/60  08/12/23 (!) 88/51   Wt Readings from Last 3 Encounters:  12/12/23 154 lb 1.6 oz (69.9 kg)  09/06/23 156 lb (70.8 kg)  08/12/23 156 lb (70.8 kg)        Physical Exam Vitals reviewed.  Constitutional:      General: He is not in acute distress.    Appearance: Normal appearance. He is not ill-appearing.  Cardiovascular:     Rate and Rhythm: Normal rate and regular rhythm.  Pulmonary:     Effort: Pulmonary effort is normal. No respiratory distress.     Breath sounds: No wheezing, rhonchi or rales.  Neurological:     Mental Status: He is alert and oriented to person, place, and time.  Psychiatric:        Mood and Affect: Mood normal.        Behavior: Behavior normal.       Results for orders placed or performed in visit on 12/12/23  POCT HgB A1C  Result Value Ref Range   Hemoglobin A1C 6.6 (A) 4.0 - 5.6 %   HbA1c POC (<> result, manual entry)     HbA1c, POC (prediabetic range)     HbA1c, POC (controlled diabetic range)      Assessment & Plan     Problem List Items Addressed This Visit     Avitaminosis D   Vitamin D  deficiency Chronic condition managed with vitamin D  supplementation. - Continue vitamin D  1000 units daily      Bilateral shoulder region arthritis   Osteoarthritis of the hands and left shoulder Chronic osteoarthritis with recent increase in Tylenol dosage. Left  shoulder previously injured but improving. - Continue Tylenol Arthritis 650 mg as needed      Relevant Medications   acetaminophen (TYLENOL) 650 MG CR tablet   BPH (benign prostatic hyperplasia)   Benign prostatic hyperplasia Reports frequent urination affecting water intake. No new symptoms or changes in management.      CAD S/P percutaneous coronary angioplasty    Atherosclerotic heart disease of native coronary artery Chronic condition managed with aspirin and metoprolol . No recent episodes of chest pain. - Continue aspirin 81 mg daily - Continue metoprolol  45 mg daily      Diabetes mellitus (HCC) - Primary   Type 2 diabetes mellitus Chronic  Well controlled with an A1c of 6.6. He is on metformin , glipizide , Actos , and insulin . Prefers to maintain current regimen despite potential to reduce insulin . Uses Dexcom CGM with occasional discrepancies between monitor and fingerstick readings. Aware of hypoglycemia symptoms. - Continue metformin  500 mg daily - Continue glipizide  10 mg daily - Continue Actos  45 mg daily - Continue Lantus  4 units twice daily - request records for updated diabetes eye exam - POC A1C ordered today      Relevant Medications   Accu-Chek Softclix Lancets lancets   Other Relevant Orders   POCT HgB A1C (Completed)   Gastro-esophageal reflux disease without esophagitis    Gastroesophageal reflux disease (GERD) Chronic condition managed with Pepcid. - Continue Pepcid 20 mg daily      Hyperlipidemia associated with type 2 diabetes mellitus (HCC)   Chronic condition managed with atorvastatin . - Continue atorvastatin  40 mg daily      Hypertension associated with diabetes (HCC)    Hypertension Chronic and well controlled with a blood pressure of 122/67 mmHg. Current medication regimen is effective. -  Continue losartan  50 mg daily - Continue metoprolol  45 mg daily      Assessment & Plan   Allergic rhinitis Chronic condition managed with  cetirizine , Singulair , and Flonase . - Continue cetirizine  10 mg daily - Continue Singulair  10 mg daily - Continue Flonase  50 mcg daily each nostril  General Health Maintenance Needs updated flu vaccine and COVID-19 booster. Plans to receive flu vaccine at pharmacy. - Receive flu vaccine at pharmacy   Return in about 6 months (around 06/11/2024) for Chronic F/U, DM, HTN, Neuropathy.       Rockie Agent, MD  [facility] [phone] (phone) [phone] (fax)  [facility]

## 2023-12-12 NOTE — Assessment & Plan Note (Signed)
 Benign prostatic hyperplasia Reports frequent urination affecting water intake. No new symptoms or changes in management.

## 2023-12-12 NOTE — Assessment & Plan Note (Signed)
  Hypertension Chronic and well controlled with a blood pressure of 122/67 mmHg. Current medication regimen is effective. - Continue losartan  50 mg daily - Continue metoprolol  45 mg daily

## 2023-12-12 NOTE — Patient Instructions (Signed)
 To keep you healthy, please keep in mind the following health maintenance items that you are due for:   Health Maintenance Due  Topic Date Due   Influenza Vaccine  09/30/2023   OPHTHALMOLOGY EXAM  11/24/2023     Best Wishes,   Dr. Lang

## 2023-12-15 ENCOUNTER — Other Ambulatory Visit: Payer: Self-pay | Admitting: Family Medicine

## 2023-12-25 ENCOUNTER — Other Ambulatory Visit: Payer: Self-pay | Admitting: Family Medicine

## 2023-12-31 DIAGNOSIS — E1165 Type 2 diabetes mellitus with hyperglycemia: Secondary | ICD-10-CM | POA: Diagnosis not present

## 2023-12-31 DIAGNOSIS — Z794 Long term (current) use of insulin: Secondary | ICD-10-CM | POA: Diagnosis not present

## 2024-01-03 DIAGNOSIS — K5900 Constipation, unspecified: Secondary | ICD-10-CM | POA: Diagnosis not present

## 2024-01-04 DIAGNOSIS — M9903 Segmental and somatic dysfunction of lumbar region: Secondary | ICD-10-CM | POA: Diagnosis not present

## 2024-01-04 DIAGNOSIS — M531 Cervicobrachial syndrome: Secondary | ICD-10-CM | POA: Diagnosis not present

## 2024-01-04 DIAGNOSIS — M9902 Segmental and somatic dysfunction of thoracic region: Secondary | ICD-10-CM | POA: Diagnosis not present

## 2024-01-04 DIAGNOSIS — M546 Pain in thoracic spine: Secondary | ICD-10-CM | POA: Diagnosis not present

## 2024-01-04 DIAGNOSIS — M545 Low back pain, unspecified: Secondary | ICD-10-CM | POA: Diagnosis not present

## 2024-01-04 DIAGNOSIS — M9901 Segmental and somatic dysfunction of cervical region: Secondary | ICD-10-CM | POA: Diagnosis not present

## 2024-01-23 DIAGNOSIS — K130 Diseases of lips: Secondary | ICD-10-CM | POA: Diagnosis not present

## 2024-01-23 DIAGNOSIS — L718 Other rosacea: Secondary | ICD-10-CM | POA: Diagnosis not present

## 2024-01-23 DIAGNOSIS — L570 Actinic keratosis: Secondary | ICD-10-CM | POA: Diagnosis not present

## 2024-01-28 ENCOUNTER — Other Ambulatory Visit: Payer: Self-pay | Admitting: Family Medicine

## 2024-01-28 DIAGNOSIS — N183 Chronic kidney disease, stage 3 unspecified: Secondary | ICD-10-CM

## 2024-01-30 DIAGNOSIS — Z794 Long term (current) use of insulin: Secondary | ICD-10-CM | POA: Diagnosis not present

## 2024-01-30 DIAGNOSIS — E1165 Type 2 diabetes mellitus with hyperglycemia: Secondary | ICD-10-CM | POA: Diagnosis not present

## 2024-02-01 DIAGNOSIS — M546 Pain in thoracic spine: Secondary | ICD-10-CM | POA: Diagnosis not present

## 2024-02-01 DIAGNOSIS — M545 Low back pain, unspecified: Secondary | ICD-10-CM | POA: Diagnosis not present

## 2024-02-01 DIAGNOSIS — M9901 Segmental and somatic dysfunction of cervical region: Secondary | ICD-10-CM | POA: Diagnosis not present

## 2024-02-01 DIAGNOSIS — M531 Cervicobrachial syndrome: Secondary | ICD-10-CM | POA: Diagnosis not present

## 2024-02-01 DIAGNOSIS — M9903 Segmental and somatic dysfunction of lumbar region: Secondary | ICD-10-CM | POA: Diagnosis not present

## 2024-02-01 DIAGNOSIS — M9902 Segmental and somatic dysfunction of thoracic region: Secondary | ICD-10-CM | POA: Diagnosis not present

## 2024-03-21 ENCOUNTER — Other Ambulatory Visit: Payer: Self-pay | Admitting: Family Medicine

## 2024-03-21 DIAGNOSIS — I10 Essential (primary) hypertension: Secondary | ICD-10-CM

## 2024-03-21 DIAGNOSIS — E119 Type 2 diabetes mellitus without complications: Secondary | ICD-10-CM

## 2024-03-22 ENCOUNTER — Other Ambulatory Visit: Payer: Self-pay | Admitting: Family Medicine

## 2024-03-22 DIAGNOSIS — I10 Essential (primary) hypertension: Secondary | ICD-10-CM

## 2024-03-22 DIAGNOSIS — E119 Type 2 diabetes mellitus without complications: Secondary | ICD-10-CM

## 2024-03-22 NOTE — Telephone Encounter (Signed)
 Copied from CRM [id number]. Topic: Clinical - Medication Question >> Mar 22, 2024  1:13 PM Geneva B wrote: Reason for CRM: patient is calling has questions about his rx   Atorvastatin  Calcium  40 mg Oral Daily at bedtime  Protocol Details  glipiZIDE  10 mg Oral Daily  Protocol Details  Metoprolol  Succinate 25 mg Oral Daily was denied please call pt back [phone]

## 2024-04-25 ENCOUNTER — Ambulatory Visit: Payer: Medicare HMO

## 2024-06-11 ENCOUNTER — Ambulatory Visit: Admitting: Family Medicine
